# Patient Record
Sex: Male | Born: 1935 | Race: White | Hispanic: No | Marital: Married | Smoking: Former smoker
Health system: Southern US, Community
[De-identification: ages and names within clinical notes are randomized; demographics above are authoritative.]

## PROBLEM LIST (undated history)

## (undated) DIAGNOSIS — K259 Gastric ulcer, unspecified as acute or chronic, without hemorrhage or perforation: Secondary | ICD-10-CM

## (undated) DIAGNOSIS — F0390 Unspecified dementia without behavioral disturbance: Secondary | ICD-10-CM

## (undated) DIAGNOSIS — S065X9A Traumatic subdural hemorrhage with loss of consciousness of unspecified duration, initial encounter: Principal | ICD-10-CM

## (undated) DIAGNOSIS — I4891 Unspecified atrial fibrillation: Secondary | ICD-10-CM

## (undated) DIAGNOSIS — R05 Cough: Secondary | ICD-10-CM

## (undated) DIAGNOSIS — R269 Unspecified abnormalities of gait and mobility: Secondary | ICD-10-CM

## (undated) DIAGNOSIS — Z950 Presence of cardiac pacemaker: Secondary | ICD-10-CM

## (undated) DIAGNOSIS — D62 Acute posthemorrhagic anemia: Secondary | ICD-10-CM

## (undated) DIAGNOSIS — R634 Abnormal weight loss: Principal | ICD-10-CM

## (undated) DIAGNOSIS — E785 Hyperlipidemia, unspecified: Secondary | ICD-10-CM

## (undated) DIAGNOSIS — R609 Edema, unspecified: Secondary | ICD-10-CM

## (undated) DIAGNOSIS — E1129 Type 2 diabetes mellitus with other diabetic kidney complication: Secondary | ICD-10-CM

## (undated) DIAGNOSIS — S0081XA Abrasion of other part of head, initial encounter: Secondary | ICD-10-CM

## (undated) DIAGNOSIS — L738 Other specified follicular disorders: Secondary | ICD-10-CM

## (undated) DIAGNOSIS — R55 Syncope and collapse: Secondary | ICD-10-CM

## (undated) DIAGNOSIS — F32A Depression, unspecified: Secondary | ICD-10-CM

## (undated) DIAGNOSIS — R58 Hemorrhage, not elsewhere classified: Secondary | ICD-10-CM

## (undated) DIAGNOSIS — D649 Anemia, unspecified: Secondary | ICD-10-CM

## (undated) DIAGNOSIS — F329 Major depressive disorder, single episode, unspecified: Secondary | ICD-10-CM

## (undated) DIAGNOSIS — H9190 Unspecified hearing loss, unspecified ear: Secondary | ICD-10-CM

## (undated) DIAGNOSIS — R5383 Other fatigue: Secondary | ICD-10-CM

## (undated) DIAGNOSIS — I739 Peripheral vascular disease, unspecified: Secondary | ICD-10-CM

## (undated) DIAGNOSIS — K250 Acute gastric ulcer with hemorrhage: Secondary | ICD-10-CM

## (undated) DIAGNOSIS — N184 Chronic kidney disease, stage 4 (severe): Secondary | ICD-10-CM

## (undated) DIAGNOSIS — R32 Unspecified urinary incontinence: Secondary | ICD-10-CM

## (undated) DIAGNOSIS — W19XXXA Unspecified fall, initial encounter: Secondary | ICD-10-CM

## (undated) DIAGNOSIS — I482 Chronic atrial fibrillation, unspecified: Secondary | ICD-10-CM

## (undated) DIAGNOSIS — I495 Sick sinus syndrome: Secondary | ICD-10-CM

## (undated) DIAGNOSIS — R5381 Other malaise: Secondary | ICD-10-CM

## (undated) DIAGNOSIS — K922 Gastrointestinal hemorrhage, unspecified: Secondary | ICD-10-CM

## (undated) DIAGNOSIS — I10 Essential (primary) hypertension: Secondary | ICD-10-CM

## (undated) DIAGNOSIS — IMO0001 Reserved for inherently not codable concepts without codable children: Secondary | ICD-10-CM

## (undated) DIAGNOSIS — M6281 Muscle weakness (generalized): Secondary | ICD-10-CM

## (undated) DIAGNOSIS — L299 Pruritus, unspecified: Secondary | ICD-10-CM

## (undated) DIAGNOSIS — E559 Vitamin D deficiency, unspecified: Secondary | ICD-10-CM

## (undated) DIAGNOSIS — Z9181 History of falling: Secondary | ICD-10-CM

## (undated) DIAGNOSIS — N182 Chronic kidney disease, stage 2 (mild): Secondary | ICD-10-CM

## (undated) DIAGNOSIS — R059 Cough, unspecified: Secondary | ICD-10-CM

## (undated) HISTORY — DX: Traumatic subdural hemorrhage with loss of consciousness of unspecified duration, initial encounter: S06.5X9A

## (undated) HISTORY — DX: Hemorrhage, not elsewhere classified: R58

## (undated) HISTORY — DX: Vitamin D deficiency, unspecified: E55.9

## (undated) HISTORY — DX: Other specified follicular disorders: L73.8

## (undated) HISTORY — DX: Chronic atrial fibrillation, unspecified: I48.20

## (undated) HISTORY — DX: Muscle weakness (generalized): M62.81

## (undated) HISTORY — DX: Other fatigue: R53.83

## (undated) HISTORY — DX: Cough: R05

## (undated) HISTORY — DX: Abrasion of other part of head, initial encounter: S00.81XA

## (undated) HISTORY — DX: Other malaise: R53.81

## (undated) HISTORY — DX: Cough, unspecified: R05.9

## (undated) HISTORY — DX: Chronic kidney disease, stage 2 (mild): N18.2

## (undated) HISTORY — DX: Anemia, unspecified: D64.9

## (undated) HISTORY — DX: Unspecified hearing loss, unspecified ear: H91.90

## (undated) HISTORY — DX: Unspecified fall, initial encounter: W19.XXXA

## (undated) HISTORY — DX: Peripheral vascular disease, unspecified: I73.9

## (undated) HISTORY — PX: NO PAST SURGERIES: SHX2092

## (undated) HISTORY — DX: Type 2 diabetes mellitus with other diabetic kidney complication: E11.29

## (undated) HISTORY — DX: History of falling: Z91.81

## (undated) HISTORY — DX: Pruritus, unspecified: L29.9

## (undated) HISTORY — DX: Gastrointestinal hemorrhage, unspecified: K92.2

## (undated) HISTORY — DX: Syncope and collapse: R55

## (undated) HISTORY — DX: Reserved for inherently not codable concepts without codable children: IMO0001

## (undated) HISTORY — DX: Sick sinus syndrome: I49.5

## (undated) HISTORY — DX: Abnormal weight loss: R63.4

## (undated) HISTORY — DX: Acute posthemorrhagic anemia: D62

## (undated) HISTORY — DX: Hyperlipidemia, unspecified: E78.5

## (undated) HISTORY — DX: Acute gastric ulcer with hemorrhage: K25.0

## (undated) HISTORY — DX: Essential (primary) hypertension: I10

## (undated) HISTORY — DX: Presence of cardiac pacemaker: Z95.0

## (undated) HISTORY — DX: Unspecified abnormalities of gait and mobility: R26.9

## (undated) HISTORY — DX: Chronic kidney disease, stage 4 (severe): N18.4

## (undated) HISTORY — DX: Edema, unspecified: R60.9

## (undated) HISTORY — DX: Unspecified atrial fibrillation: I48.91

## (undated) HISTORY — DX: Unspecified urinary incontinence: R32

---
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
## 1997-12-05 ENCOUNTER — Ambulatory Visit (HOSPITAL_COMMUNITY): Admission: RE | Admit: 1997-12-05 | Discharge: 1997-12-05 | Payer: Self-pay | Admitting: *Deleted

## 1999-02-07 ENCOUNTER — Ambulatory Visit (HOSPITAL_COMMUNITY): Admission: RE | Admit: 1999-02-07 | Discharge: 1999-02-07 | Payer: Self-pay | Admitting: *Deleted

## 2001-01-06 HISTORY — PX: PACEMAKER INSERTION: SHX728

## 2001-04-04 ENCOUNTER — Encounter: Payer: Self-pay | Admitting: Emergency Medicine

## 2001-04-04 ENCOUNTER — Encounter: Payer: Self-pay | Admitting: Internal Medicine

## 2001-04-04 ENCOUNTER — Inpatient Hospital Stay (HOSPITAL_COMMUNITY): Admission: EM | Admit: 2001-04-04 | Discharge: 2001-04-08 | Payer: Self-pay | Admitting: Emergency Medicine

## 2001-04-08 ENCOUNTER — Encounter: Payer: Self-pay | Admitting: Cardiology

## 2003-02-21 ENCOUNTER — Emergency Department (HOSPITAL_COMMUNITY): Admission: EM | Admit: 2003-02-21 | Discharge: 2003-02-21 | Payer: Self-pay | Admitting: Emergency Medicine

## 2004-04-17 ENCOUNTER — Ambulatory Visit (HOSPITAL_COMMUNITY): Admission: RE | Admit: 2004-04-17 | Discharge: 2004-04-17 | Payer: Self-pay | Admitting: *Deleted

## 2005-09-18 ENCOUNTER — Encounter: Admission: RE | Admit: 2005-09-18 | Discharge: 2005-09-18 | Payer: Self-pay | Admitting: Internal Medicine

## 2007-05-11 ENCOUNTER — Encounter: Payer: Self-pay | Admitting: Internal Medicine

## 2007-06-16 ENCOUNTER — Inpatient Hospital Stay (HOSPITAL_COMMUNITY): Admission: EM | Admit: 2007-06-16 | Discharge: 2007-06-17 | Payer: Self-pay | Admitting: Emergency Medicine

## 2007-06-17 ENCOUNTER — Encounter (INDEPENDENT_AMBULATORY_CARE_PROVIDER_SITE_OTHER): Payer: Self-pay | Admitting: Internal Medicine

## 2007-06-17 ENCOUNTER — Ambulatory Visit: Payer: Self-pay | Admitting: Surgery

## 2007-08-05 ENCOUNTER — Ambulatory Visit (HOSPITAL_COMMUNITY): Admission: RE | Admit: 2007-08-05 | Discharge: 2007-08-05 | Payer: Self-pay | Admitting: Psychiatry

## 2007-08-05 HISTORY — PX: PACEMAKER GENERATOR CHANGE: SHX5998

## 2007-11-23 ENCOUNTER — Encounter: Payer: Self-pay | Admitting: Internal Medicine

## 2008-04-25 ENCOUNTER — Encounter: Payer: Self-pay | Admitting: Internal Medicine

## 2008-08-08 DIAGNOSIS — R55 Syncope and collapse: Secondary | ICD-10-CM

## 2008-08-08 HISTORY — DX: Syncope and collapse: R55

## 2008-11-07 ENCOUNTER — Encounter: Payer: Self-pay | Admitting: Internal Medicine

## 2008-12-25 DIAGNOSIS — N189 Chronic kidney disease, unspecified: Secondary | ICD-10-CM

## 2008-12-25 DIAGNOSIS — E785 Hyperlipidemia, unspecified: Secondary | ICD-10-CM

## 2008-12-25 DIAGNOSIS — E119 Type 2 diabetes mellitus without complications: Secondary | ICD-10-CM

## 2008-12-25 DIAGNOSIS — I495 Sick sinus syndrome: Secondary | ICD-10-CM | POA: Insufficient documentation

## 2008-12-25 DIAGNOSIS — I10 Essential (primary) hypertension: Secondary | ICD-10-CM | POA: Insufficient documentation

## 2008-12-26 ENCOUNTER — Ambulatory Visit: Payer: Self-pay | Admitting: Internal Medicine

## 2009-06-08 ENCOUNTER — Ambulatory Visit: Payer: Self-pay | Admitting: Internal Medicine

## 2009-06-13 ENCOUNTER — Telehealth: Payer: Self-pay | Admitting: Internal Medicine

## 2009-07-05 ENCOUNTER — Telehealth (INDEPENDENT_AMBULATORY_CARE_PROVIDER_SITE_OTHER): Payer: Self-pay | Admitting: *Deleted

## 2009-07-12 ENCOUNTER — Ambulatory Visit: Payer: Self-pay | Admitting: Internal Medicine

## 2009-08-28 ENCOUNTER — Encounter: Payer: Self-pay | Admitting: Internal Medicine

## 2009-09-21 ENCOUNTER — Inpatient Hospital Stay (HOSPITAL_COMMUNITY): Admission: EM | Admit: 2009-09-21 | Discharge: 2009-09-28 | Payer: Self-pay | Admitting: Emergency Medicine

## 2009-09-22 ENCOUNTER — Encounter: Payer: Self-pay | Admitting: Internal Medicine

## 2009-09-24 ENCOUNTER — Encounter (INDEPENDENT_AMBULATORY_CARE_PROVIDER_SITE_OTHER): Payer: Self-pay | Admitting: Internal Medicine

## 2009-10-11 ENCOUNTER — Ambulatory Visit: Payer: Self-pay | Admitting: Internal Medicine

## 2009-12-18 ENCOUNTER — Ambulatory Visit: Payer: Self-pay | Admitting: Internal Medicine

## 2009-12-24 ENCOUNTER — Encounter: Payer: Self-pay | Admitting: Internal Medicine

## 2009-12-25 ENCOUNTER — Encounter: Payer: Self-pay | Admitting: Internal Medicine

## 2010-01-01 DIAGNOSIS — R32 Unspecified urinary incontinence: Secondary | ICD-10-CM

## 2010-01-01 HISTORY — DX: Unspecified urinary incontinence: R32

## 2010-01-27 ENCOUNTER — Encounter: Payer: Self-pay | Admitting: Internal Medicine

## 2010-02-05 NOTE — Progress Notes (Signed)
  Records Recieved from [facility], gave to [employer]  July 05, 2009 2:38 PM

## 2010-02-05 NOTE — Progress Notes (Signed)
Summary: [facility]   Imported By: Roderic Ovens 08/16/2009 09:41:44  _____________________________________________________________________  External Attachment:    Type:   Image     Comment:   External Document

## 2010-02-05 NOTE — Letter (Signed)
Summary: [facility]   Imported By: Marylou Mccoy 09/27/2009 15:56:45  _____________________________________________________________________  External Attachment:    Type:   Image     Comment:   External Document

## 2010-02-05 NOTE — Progress Notes (Signed)
Summary: [facility]   Imported By: Roderic Ovens 08/16/2009 09:40:32  _____________________________________________________________________  External Attachment:    Type:   Image     Comment:   External Document

## 2010-02-05 NOTE — Consult Note (Signed)
Summary: [facility]   Imported By: Roderic Ovens 10/23/2009 14:51:52  _____________________________________________________________________  External Attachment:    Type:   Image     Comment:   External Document

## 2010-02-05 NOTE — Cardiovascular Report (Signed)
Summary: TTM   TTM   Imported By: Roderic Ovens 10/16/2009 13:04:53  _____________________________________________________________________  External Attachment:    Type:   Image     Comment:   External Document

## 2010-02-05 NOTE — Cardiovascular Report (Signed)
Summary: Office Visit   Office Visit   Imported By: Roderic Ovens 06/22/2009 14:53:32  _____________________________________________________________________  External Attachment:    Type:   Image     Comment:   External Document

## 2010-02-05 NOTE — Cardiovascular Report (Signed)
Summary: TTM   TTM   Imported By: Roderic Ovens 07/26/2009 16:10:26  _____________________________________________________________________  External Attachment:    Type:   Image     Comment:   External Document

## 2010-02-05 NOTE — Progress Notes (Signed)
Summary: pt upset wants to talk to nurse about letter he got-call next wk  Phone Note Call from Patient Call back at Home Phone [phone]   Caller: Patient Reason for Call: Talk to Nurse, Talk to Doctor Summary of Call: pt is very upset because he dosen't feel like at his last visit he was checked and he said it is to much for him to drive all the way over here for nothing and he will not make another appt until someone calls him and tells him what the hell is going on because he thinks he is mixed up with a bunch of idiots Initial call taken by: Omer Jack,  June 13, 2009 1:08 PM  Follow-up for Phone Call        explained to pt that he did see Dr Ladona Ridgel and tried to help him remember.  He is going to try a find the paper he says was mailed to him that says follow up in a year.  I explained to him he needed to be seen in 6 months.  I will call him next week to see if he found paper. Dennis Bast, RN, BSN  June 13, 2009 5:24 PM    Additional Follow-up for Phone Call Additional follow up Details #2::    When he comes back in 6 months will point out Dr Ladona Ridgel to him to help with his confusion.Dennis Bast, RN, BSN  June 19, 2009 9:16 AM

## 2010-02-05 NOTE — Assessment & Plan Note (Signed)
Summary: device/saf   Visit Type:  Follow-up Primary Provider:  Chilton Si, MD   History of Present Illness: Patrick Murray is referred today for ongoing PPM followup by Dr. Aleen Campi.  The patient is a pleasant elderly man who still lives independently at [facility].  He has a h/o bradycardia and underwent PPM several yrs ago with a pulse generator change by Dr. Lynnea Ferrier several months ago.  He has done well.  He denies c/p, sob, or peripheral edema.  He has had no syncope.  He has had minimal dizziness.  Current Medications (verified): 1)  Amlodipine Besylate 5 Mg Tabs (Amlodipine Besylate) .... Take One-Half Tablet By Mouth Daily 2)  Calcitriol 0.25 Mcg Caps (Calcitriol) .... 2 Tabs Even Days 1 Tab Odd Days 3)  Losartan Potassium 100 Mg Tabs (Losartan Potassium) .... One-Half Tab Once Daily 4)  Hydrochlorothiazide 25 Mg Tabs (Hydrochlorothiazide) .... Take One-Half Tablet By Mouth Daily. 5)  Glipizide 5 Mg Tabs (Glipizide) .... Take One Tablet By Mouth Once Daily. 6)  Sertraline Hcl 50 Mg Tabs (Sertraline Hcl) .... Take One Tablet By Mouth Once Daily. 7)  Galantamine Hydrobromide .Marland Kitchen.. 2 Tabs Once Daily 8)  Glucosamine Sulfate   Powd (Glucosamine Sulfate) .... 1500mg  Once Daily 9)  Aspirin 81 Mg Tbec (Aspirin) .... Take One Tablet By Mouth Daily  Allergies (verified): 1)  ! Simvastatin  Past History:  Past Medical History: Last updated: 12/25/2008 Current Problems:  CARDIAC PACEMAKER ST.JUDE XLDR 5826 (ICD-V45.01) PAROXYSMAL ATRIAL FIBRILLATION (ICD-427.31) RENAL INSUFFICIENCY, CHRONIC (ICD-585.9) HYPERTENSION (ICD-401.9) HYPERLIPIDEMIA (ICD-272.4) DM (ICD-250.00) SICK SINUS SYNDROME (ICD-427.81)    Vital Signs:  Patient profile:   77 year old male Height:      71 inches Weight:      177 pounds BMI:     24.78 Pulse rate:   71 / minute BP sitting:   158 / 64  (left arm)  Vitals Entered By: Laurance Flatten CMA (June 08, 2009 3:23 PM)  Physical Exam  General:  Elderly, well  developed, well nourished, in no acute distress.  HEENT: normal Neck: supple. No JVD. Carotids 2+ bilaterally no bruits Cor: RRR no rubs, gallops or murmur Lungs: CTA.  Well healed PPM incision. Ab: soft, nontender. nondistended. No HSM. Good bowel sounds Ext: warm. no cyanosis, clubbing or edema Neuro: alert and oriented. Grossly nonfocal. affect pleasant    PPM Specifications Following MD:  Lewayne Bunting, MD     PPM Vendor:  St Jude     PPM Model Number:  [id number]     PPM Serial Number:  [id number] PPM DOI:  07/05/2008     PPM Implanting MD:  NOT IMPLANTED BY [place]  Lead 1    Location: RA     DOI: 04/07/2001     Model #: 1342T     Serial #: [id number]     Status: active Lead 2    Location: RV     DOI: 04/07/2001     Model #: 1336T     Serial #: [id number]     Status: active  Magnet Response Rate:  BOL 98.6 ERI  86.3    PPM Follow Up Battery Voltage:  2.81 V     Battery Est. Longevity:  8-10 YRS     Pacer Dependent:  No       PPM Device Measurements Atrium  Amplitude: 3.8 mV, Impedance: 396 ohms, Threshold: 0.50 V at 0.5 msec Right Ventricle  Amplitude: 5.7 mV, Impedance: 682 ohms, Threshold: 0.75 V at 0.5 msec  Episodes MS Episodes:  48     Percent Mode Switch:  25%     Ventricular High Rate:  0     Atrial Pacing:  62%     Ventricular Pacing:  69%  Parameters Mode:  DDD     Lower Rate Limit:  60     Upper Rate Limit:  110 Paced AV Delay:  250     Sensed AV Delay:  200 Next Cardiology Appt Due:  12/06/2009 Tech Comments:  25% OF TIME IN MODE SWITCH.  NORMAL DEVICE FUNCTION.  CHANGED RV AMPLITUDE FROM 2.00 TO 2.50 V.  ROV IN 6 MTHS.  CHECKING ON PHONE CHECKS THRU [employer]. Vella Kohler  June 08, 2009 3:45 PM  Impression & Recommendations:  Problem # 1:  CARDIAC PACEMAKER ST.JUDE XLDR 5826 (ICD-V45.01) His device is working normally.  Will start doing phone checks.    Problem # 2:  PAROXYSMAL ATRIAL FIBRILLATION (ICD-427.31) He is maintaining NSR.  Continue current meds. His updated  medication list for this problem includes:    Aspirin 81 Mg Tbec (Aspirin) .Marland Kitchen... Take one tablet by mouth daily  Problem # 3:  HYPERTENSION (ICD-401.9) A low sodium diet is requested.  He will continue meds as  below. His updated medication list for this problem includes:    Amlodipine Besylate 5 Mg Tabs (Amlodipine besylate) .Marland Kitchen... Take one-half tablet by mouth daily    Losartan Potassium 100 Mg Tabs (Losartan potassium) ..... One-half tab once daily    Hydrochlorothiazide 25 Mg Tabs (Hydrochlorothiazide) .Marland Kitchen... Take one-half tablet by mouth daily.    Aspirin 81 Mg Tbec (Aspirin) .Marland Kitchen... Take one tablet by mouth daily  Patient Instructions: 1)  Your physician recommends that you schedule a follow-up appointment in: 6 months with Dr Ladona Ridgel

## 2010-02-05 NOTE — Cardiovascular Report (Signed)
Summary: TTM   TTM   Imported By: Roderic Ovens 10/23/2009 11:07:58  _____________________________________________________________________  External Attachment:    Type:   Image     Comment:   External Document

## 2010-02-07 NOTE — Cardiovascular Report (Signed)
Summary: Office Visit   Office Visit   Imported By: Roderic Ovens 01/04/2010 16:23:01  _____________________________________________________________________  External Attachment:    Type:   Image     Comment:   External Document

## 2010-02-07 NOTE — Procedures (Signed)
Summary: device/saf   Current Medications (verified): 1)  Amlodipine Besylate 5 Mg Tabs (Amlodipine Besylate) .... Take One-Half Tablet By Mouth Daily 2)  Calcitriol 0.25 Mcg Caps (Calcitriol) .... 2 Tabs Even Days 1 Tab Odd Days 3)  Losartan Potassium 100 Mg Tabs (Losartan Potassium) .... One-Half Tab Once Daily 4)  Hydrochlorothiazide 25 Mg Tabs (Hydrochlorothiazide) .... Take One-Half Tablet By Mouth Daily. 5)  Glipizide 5 Mg Tabs (Glipizide) .... Take One Tablet By Mouth Once Daily. 6)  Sertraline Hcl 50 Mg Tabs (Sertraline Hcl) .... Take One Tablet By Mouth Once Daily. 7)  Galantamine Hydrobromide .Marland Kitchen.. 2 Tabs Once Daily 8)  Glucosamine Sulfate   Powd (Glucosamine Sulfate) .... 1500mg  Once Daily 9)  Aspirin 81 Mg Tbec (Aspirin) .... Take One Tablet By Mouth Daily  Allergies (verified): 1)  ! Simvastatin  PPM Specifications Following MD:  Lewayne Bunting, MD     PPM Vendor:  St Jude     PPM Model Number:  [id number]     PPM Serial Number:  [id number] PPM DOI:  07/05/2008     PPM Implanting MD:  NOT IMPLANTED BY [place]  Lead 1    Location: RA     DOI: 04/07/2001     Model #: 1342T     Serial #: [id number]     Status: active Lead 2    Location: RV     DOI: 04/07/2001     Model #: 1336T     Serial #: [id number]     Status: active  Magnet Response Rate:  BOL 98.6 ERI  86.3    PPM Follow Up Battery Voltage:  2.79 V     Battery Est. Longevity:  7.75-10 yrs     Pacer Dependent:  No       PPM Device Measurements Atrium  Amplitude: 1.6 mV, Impedance: 350 ohms,  Right Ventricle  Amplitude: 6.9 mV, Impedance: 521 ohms, Threshold: 0.75 V at 0.5 msec  Episodes MS Episodes:  16     Percent Mode Switch:  76%     Ventricular High Rate:  0     Atrial Pacing:  66%     Ventricular Pacing:  77%  Parameters Mode:  DDD     Lower Rate Limit:  60     Upper Rate Limit:  110 Paced AV Delay:  250     Sensed AV Delay:  200 Next Cardiology Appt Due:  06/07/2010 Tech Comments:  PT IN AF 76% OF TIME. - COUMADIN.  NORMAL  DEVICE FUNCTION.  NO CHANGES MADE. ROV IN 6 MTHS W/GT. Vella Kohler  December 24, 2009 11:43 AM

## 2010-02-07 NOTE — Letter (Signed)
Summary: [facility] Progress Note   [facility] Progress Note   Imported By: Roderic Ovens 01/09/2010 16:04:09  _____________________________________________________________________  External Attachment:    Type:   Image     Comment:   External Document

## 2010-02-28 ENCOUNTER — Encounter: Payer: Self-pay | Admitting: Internal Medicine

## 2010-02-28 DIAGNOSIS — I495 Sick sinus syndrome: Secondary | ICD-10-CM

## 2010-03-19 NOTE — Cardiovascular Report (Signed)
Summary: TTM   TTM   Imported By: Roderic Ovens 03/12/2010 15:58:23  _____________________________________________________________________  External Attachment:    Type:   Image     Comment:   External Document

## 2010-03-21 LAB — CBC
HCT: 20.3 % — ABNORMAL LOW (ref 39.0–52.0)
HCT: 26 % — ABNORMAL LOW (ref 39.0–52.0)
HCT: 26.2 % — ABNORMAL LOW (ref 39.0–52.0)
HCT: 26.6 % — ABNORMAL LOW (ref 39.0–52.0)
HCT: 26.9 % — ABNORMAL LOW (ref 39.0–52.0)
Hemoglobin: 8.9 g/dL — ABNORMAL LOW (ref 13.0–17.0)
Hemoglobin: 9 g/dL — ABNORMAL LOW (ref 13.0–17.0)
Hemoglobin: 9.2 g/dL — ABNORMAL LOW (ref 13.0–17.0)
Hemoglobin: 9.4 g/dL — ABNORMAL LOW (ref 13.0–17.0)
MCH: 31 pg (ref 26.0–34.0)
MCH: 31.7 pg (ref 26.0–34.0)
MCH: 31.9 pg (ref 26.0–34.0)
MCH: 31.9 pg (ref 26.0–34.0)
MCHC: 34.5 g/dL (ref 30.0–36.0)
MCHC: 34.5 g/dL (ref 30.0–36.0)
MCHC: 34.7 g/dL (ref 30.0–36.0)
MCHC: 34.8 g/dL (ref 30.0–36.0)
MCV: 89.8 fL (ref 78.0–100.0)
MCV: 91.1 fL (ref 78.0–100.0)
MCV: 91.2 fL (ref 78.0–100.0)
MCV: 91.2 fL (ref 78.0–100.0)
Platelets: 123 10*3/uL — ABNORMAL LOW (ref 150–400)
Platelets: 130 10*3/uL — ABNORMAL LOW (ref 150–400)
Platelets: 131 10*3/uL — ABNORMAL LOW (ref 150–400)
Platelets: 132 10*3/uL — ABNORMAL LOW (ref 150–400)
RBC: 2.82 MIL/uL — ABNORMAL LOW (ref 4.22–5.81)
RBC: 3 MIL/uL — ABNORMAL LOW (ref 4.22–5.81)
RDW: 14 % (ref 11.5–15.5)
RDW: 14.3 % (ref 11.5–15.5)
RDW: 14.4 % (ref 11.5–15.5)
RDW: 14.7 % (ref 11.5–15.5)
RDW: 14.8 % (ref 11.5–15.5)
WBC: 11.4 10*3/uL — ABNORMAL HIGH (ref 4.0–10.5)
WBC: 7.6 10*3/uL (ref 4.0–10.5)
WBC: 9 10*3/uL (ref 4.0–10.5)

## 2010-03-21 LAB — RENAL FUNCTION PANEL
BUN: 68 mg/dL — ABNORMAL HIGH (ref 6–23)
CO2: 20 mEq/L (ref 19–32)
Calcium: 8.1 mg/dL — ABNORMAL LOW (ref 8.4–10.5)
Glucose, Bld: 94 mg/dL (ref 70–99)
Sodium: 139 mEq/L (ref 135–145)

## 2010-03-21 LAB — RETICULOCYTES
RBC.: 2.63 MIL/uL — ABNORMAL LOW (ref 4.22–5.81)
Retic Count, Absolute: 65.8 10*3/uL (ref 19.0–186.0)
Retic Ct Pct: 2.5 % (ref 0.4–3.1)

## 2010-03-21 LAB — GLUCOSE, CAPILLARY
Glucose-Capillary: 115 mg/dL — ABNORMAL HIGH (ref 70–99)
Glucose-Capillary: 131 mg/dL — ABNORMAL HIGH (ref 70–99)
Glucose-Capillary: 135 mg/dL — ABNORMAL HIGH (ref 70–99)
Glucose-Capillary: 143 mg/dL — ABNORMAL HIGH (ref 70–99)
Glucose-Capillary: 146 mg/dL — ABNORMAL HIGH (ref 70–99)
Glucose-Capillary: 147 mg/dL — ABNORMAL HIGH (ref 70–99)
Glucose-Capillary: 187 mg/dL — ABNORMAL HIGH (ref 70–99)
Glucose-Capillary: 89 mg/dL (ref 70–99)
Glucose-Capillary: 94 mg/dL (ref 70–99)

## 2010-03-21 LAB — COMPREHENSIVE METABOLIC PANEL
ALT: 12 U/L (ref 0–53)
AST: 18 U/L (ref 0–37)
Albumin: 3.6 g/dL (ref 3.5–5.2)
Calcium: 9.5 mg/dL (ref 8.4–10.5)
GFR calc Af Amer: 32 mL/min — ABNORMAL LOW (ref >60)
Potassium: 4.8 mEq/L (ref 3.5–5.1)
Sodium: 140 mEq/L (ref 135–145)
Total Protein: 6.6 g/dL (ref 6.0–8.3)

## 2010-03-21 LAB — DIFFERENTIAL
Basophils Absolute: 0 10*3/uL (ref 0.0–0.1)
Basophils Relative: 0 % (ref 0–1)
Eosinophils Absolute: 0 10*3/uL (ref 0.0–0.7)
Eosinophils Relative: 0 % (ref 0–5)
Lymphs Abs: 0.5 10*3/uL — ABNORMAL LOW (ref 0.7–4.0)
Monocytes Absolute: 0.4 10*3/uL (ref 0.1–1.0)
Monocytes Relative: 4 % (ref 3–12)
Neutro Abs: 7 10*3/uL (ref 1.7–7.7)
Neutrophils Relative %: 78 % — ABNORMAL HIGH (ref 43–77)

## 2010-03-21 LAB — CULTURE, BLOOD (ROUTINE X 2)

## 2010-03-21 LAB — URINE CULTURE
Culture  Setup Time: 201109170141
Culture: NO GROWTH

## 2010-03-21 LAB — FOLATE: Folate: 9.1 ng/mL

## 2010-03-21 LAB — BASIC METABOLIC PANEL
BUN: 26 mg/dL — ABNORMAL HIGH (ref 6–23)
BUN: 37 mg/dL — ABNORMAL HIGH (ref 6–23)
BUN: 53 mg/dL — ABNORMAL HIGH (ref 6–23)
BUN: 89 mg/dL — ABNORMAL HIGH (ref 6–23)
CO2: 19 mEq/L (ref 19–32)
CO2: 20 mEq/L (ref 19–32)
CO2: 20 mEq/L (ref 19–32)
Calcium: 8.5 mg/dL (ref 8.4–10.5)
Chloride: 112 mEq/L (ref 96–112)
Chloride: 114 mEq/L — ABNORMAL HIGH (ref 96–112)
Chloride: 115 mEq/L — ABNORMAL HIGH (ref 96–112)
Creatinine, Ser: 1.96 mg/dL — ABNORMAL HIGH (ref 0.4–1.5)
Creatinine, Ser: 2 mg/dL — ABNORMAL HIGH (ref 0.4–1.5)
Creatinine, Ser: 2.02 mg/dL — ABNORMAL HIGH (ref 0.4–1.5)
GFR calc Af Amer: 38 mL/min — ABNORMAL LOW (ref >60)
GFR calc non Af Amer: 32 mL/min — ABNORMAL LOW (ref >60)
Glucose, Bld: 125 mg/dL — ABNORMAL HIGH (ref 70–99)
Glucose, Bld: 129 mg/dL — ABNORMAL HIGH (ref 70–99)
Glucose, Bld: 139 mg/dL — ABNORMAL HIGH (ref 70–99)
Potassium: 4 mEq/L (ref 3.5–5.1)
Potassium: 4.1 mEq/L (ref 3.5–5.1)
Sodium: 141 mEq/L (ref 135–145)

## 2010-03-21 LAB — CARDIAC PANEL(CRET KIN+CKTOT+MB+TROPI)
CK, MB: 5.9 ng/mL — ABNORMAL HIGH (ref 0.3–4.0)
CK, MB: 6.8 ng/mL (ref 0.3–4.0)
Relative Index: 2.5 (ref 0.0–2.5)
Relative Index: 2.8 — ABNORMAL HIGH (ref 0.0–2.5)
Relative Index: 3.8 — ABNORMAL HIGH (ref 0.0–2.5)
Total CK: 174 U/L (ref 7–232)
Troponin I: 0.02 ng/mL (ref 0.00–0.06)
Troponin I: 0.03 ng/mL (ref 0.00–0.06)

## 2010-03-21 LAB — URINALYSIS, ROUTINE W REFLEX MICROSCOPIC
Bilirubin Urine: NEGATIVE
Hgb urine dipstick: NEGATIVE
Ketones, ur: NEGATIVE mg/dL
Specific Gravity, Urine: 1.018 (ref 1.005–1.030)
Urobilinogen, UA: 0.2 mg/dL (ref 0.0–1.0)

## 2010-03-21 LAB — CROSSMATCH

## 2010-03-21 LAB — FERRITIN: Ferritin: 46 ng/mL (ref 22–322)

## 2010-03-21 LAB — CORTISOL: Cortisol, Plasma: 14.8 ug/dL

## 2010-03-21 LAB — POCT CARDIAC MARKERS
CKMB, poc: 4.5 ng/mL (ref 1.0–8.0)
Myoglobin, poc: 394 ng/mL (ref 12–200)
Troponin i, poc: <0.05 ng/mL (ref 0.00–0.09)

## 2010-03-21 LAB — CREATININE, URINE, RANDOM: Creatinine, Urine: 74.3 mg/dL

## 2010-03-21 LAB — IRON AND TIBC
Iron: 124 ug/dL (ref 42–135)
UIBC: 211 ug/dL

## 2010-03-21 LAB — PROTIME-INR: INR: 1.15 (ref 0.00–1.49)

## 2010-05-21 NOTE — H&P (Signed)
NAME:  Patrick Murray, Patrick Murray NO.:  [id number]   MEDICAL RECORD NO.:  [id number]          PATIENT TYPE:  OBV   LOCATION:  1827                         FACILITY:  [facility]   PHYSICIAN:  Mobolaji B. Bakare, M.D.DATE OF BIRTH:  Jul 15, 1917   DATE OF ADMISSION:  06/16/2007  DATE OF DISCHARGE:                              HISTORY & PHYSICAL   PRIMARY CARE PHYSICIAN:  Dr. Merri Brunette.   CARDIOLOGIST:  Dr. Aleen Campi.   CHIEF COMPLAINT:  Almost passing out.   HISTORY OF PRESENTING COMPLAINT:  Patrick Murray is a pleasant 77-year-  old Caucasian male who looks younger for his age.  He is pretty much  active at home.  He goes to the gym 3 times a week.  Today when he woke  up, he was not feeling well.  He tells me that he decided to go to the  gym anyway.  Upon arrival at the gym while changing, he felt the urge to  go to the bathroom.  On entering the bathroom, he did not feel his usual  self.  He leaned against the wall and slid down and sat on the floor.  There was no associated chest pain, diaphoresis.  No shortness of  breath.  He denies palpitations.  He did not pass out.  Bystander  decided to call EMS.  The patient was brought to the emergency room for  further evaluation.  He has been seen by Dr. Aleen Campi, and his pacemaker  has been interrogated.  This appears to be okay.  There is no change in  pacer status.  The patient is being admitted for 24-hour observation.  He had an orthostatic blood pressure and heart rate done in the  emergency room.  He was not orthostatic.   The patient tells me he has been having on and off loose stools for  about one to two weeks.  There is no abdominal pain, fever, chills.  He  denies dysuria, urgency or increased frequency of micturition.  He does  not strain at micturition.  He denies cough, shortness of breath,  headaches.   REVIEW OF SYSTEMS:  As in the HPI.   PAST MEDICAL HISTORY:  1. Sick sinus syndrome status post pacemaker.  2. Diabetes mellitus.  3. Chronic kidney disease.  Baseline BUN and creatinine in January      2009 was 34/1.9.  4. History of paroxysmal atrial fibrillation.  5. Hyperlipidemia.  6. Hypertension.  7. Chronic renal insufficiency.   PAST SURGICAL HISTORY:  1. Pacemaker insertion.  2. Appendectomy.  3. Cholecystectomy.   CURRENT MEDICATIONS:  The patient is unable to remember list of current  medications.  He obtains his medications from the [place] system.  The  patient tells me that he has taken almost all of his medications for  today.  He was previously on Coumadin, but this was temporarily held and  replaced with Plavix in view of possibility of changing the pacemaker  battery.  Hence, he is not currently on Coumadin.   ALLERGIES:  No known drug allergies.   FAMILY HISTORY:  Noncontributory.   SOCIAL HISTORY:  The patient  does not smoke cigarettes.  He occasionally  drinks wine.  He is a retired Cytogeneticist.  He worked at [employer].  The patient lives in [facility] in the independent  living.  His a wife is in a nursing facility.   CURRENT VITALS:  Temperature 98.3, blood pressure 165/71, heart rate 73,  respiratory rate 20, O2 saturations 98% on room air.  On examination, the patient is awake, alert, oriented to time, place and  person.  Normocephalic, atraumatic.  Pupils equal, round and reactive to light.  No nystagmus.  No elevated JVD.  No carotid bruit.  LUNGS:  Clear clinically to auscultation.  CARDIOVASCULAR SYSTEM:  S1-S2 regular.  No murmur.  ABDOMEN:  Nondistended, soft, nontender.  No palpable organomegaly.  EXTREMITIES:  No pedal edema or calf tenderness.  Dorsalis pedis pulses  palpable bilaterally.  He has a left ankle swelling.  CENTRAL NERVOUS SYSTEM:  No focal neurological deficit.   INITIAL LABORATORY DATA:  Troponin 0.01, CK-MB 8.3, relative index 3.9.  Sodium 140, potassium 4.2, chloride 111, glucose 197, BUN 36, creatinine  2.2.   White cell 11, hemoglobin 12, hematocrit 34.6, platelets 120,  neutrophils 87%.   EKG shows no acute ST abnormality.  Chest x-ray shows no active lung  disease, linear scarring or atelectasis at the left lung base.   ASSESSMENT AND PLAN:  Patrick Murray is an 76 year old Caucasian male  presenting with presyncope associated with sudden onset of weakness.  He  is now feeling much better.  He has been having ongoing loose stool  intermittently for the past one to two weeks.  He has leukocytosis of  11,000.  No fever and no obvious sign of infection.  He will be admitted  for further evaluation and treatment.  His pacemaker has been  interrogated, and this seems to do working alright.   ADMISSION DIAGNOSES:  1. Presyncope.  Etiology is unclear.  Will admit to telemetry.  Cycle      cardiac enzymes.  He has no cardiac murmur, and I am not sure if he      had a recent 2-D echocardiogram with Dr. Aleen Campi.  Will defer      these to cardiology.  2. Leukocytosis, probably reactive.  Chest x-ray shows no acute      pulmonary disease.  Will check urinalysis.  3. Loose stools.  Will send stool for clostridium difficile, fecal      leukocytes, ova and parasites, stool culture.  I am not sure he is      on any laxative.  The patient does not think so.  4. Diabetes mellitus.  He cannot recall the name of his medications.      Will place on sliding scale insulin with NovoLog and obtain record      medication list from Dr. Renne Crigler in the morning.  5. Thrombocytopenia.  He has no active bleeding.  Baseline platelets      unknown.  Will monitor platelets during this hospitalization.  6. Mild anemia.  Will check stool Hemoccult and anemia panel.  7. Chronic kidney disease.  BUN and creatinine in January was 1.9.      This is fairly stable.  Will monitor.  8. Left ankle swelling.  Will check lower extremity Dopplers to rule      out DVT.      Mobolaji B. Corky Downs, M.D.  Electronically Signed      MBB/MEDQ  D:  06/16/2007  T:  06/16/2007  Job:  [id number]   cc:   Soyla Murphy. Renne Crigler, M.D.  Antionette Char, MD  Dyke Maes, M.D.

## 2010-05-21 NOTE — Discharge Summary (Signed)
NAME:  Patrick Murray, Patrick Murray NO.:  [id number]   MEDICAL RECORD NO.:  [id number]          PATIENT TYPE:  INP   LOCATION:  4729                         FACILITY:  [facility]   PHYSICIAN:  Mobolaji B. Bakare, M.D.DATE OF BIRTH:  [birth year]   DATE OF ADMISSION:  06/16/2007  DATE OF DISCHARGE:  06/17/2007                               DISCHARGE SUMMARY   PRIMARY CARE PHYSICIAN:  Dr. Renne Crigler.   CARDIOLOGIST:  Dr. Aleen Campi.   DISCHARGE DIAGNOSES:  1. Presyncope, probably secondary to dehydration.  2. Loose stools, resolved.  3. Acute on chronic kidney disease.  4. Left ankle swelling, probably secondary to trauma.  5. Mild thrombocytopenia, stable.  6. Mild anemia.   SECONDARY DIAGNOSES:  1. Diabetes mellitus.  2. Sick sinus syndrome, status post pacemaker.  3. History of paroxysmal atrial fibrillation, hyperlipidemia,      hypertension, and chronic renal insufficiency.   CONSULTANTS:  Cardiology consult provided by Dr. Aleen Campi.   PROCEDURES:  1. Left lower extremity Doppler was negative for DVT.  2. Chest x-ray showed no acute lung disease, linear scarring, or      atelectasis at the left lung base.   BRIEF HISTORY:  In brief, Patrick Murray is an 77 year old Caucasian  male who was admitted yesterday with history of almost passing out.  He  was not feeling well when he woke up yesterday morning.  He went to the  gym and while about to change in the gym he had an urge to go to the  bathroom.  On entering the bathroom he did not feel his usual self,  hence he had to lean against the wall.  He eventually slid down and sat  on the floor.  He did not pass out.  No diaphoresis, chest pain, or  shortness of breath.  EMS was called, and he was brought to the  emergency room for further evaluation.   Prior to this episode, the patient has been having on-and-off loose  stools for 2 weeks.  No associated nausea, vomiting, or abdominal pain.  He had no fever.  He normally walks  around friend's home, where he  resides.  In addition, he goes to the gym about 3 times a day.  While in  the emergency room he was not orthostatic; however, his BUN and  creatinine were slightly worse than what it was in chart in January  2009.   The patient was evaluated by Dr. Aleen Campi in the emergency room.  His  pacemaker was interrogated.  Dr. Aleen Campi opined that he might have been  pacing at the lower rate limit with standing.  Otherwise, the pacemaker  checkup was appropriate.  Prior to this hospitalization, there was  discussion regarding changing the pacemaker's battery.  The patient will  be following up with Dr. Aleen Campi in the outpatient setting in 1 week.   HOSPITAL COURSE:  Presyncope.  He had 3 sets of cardiac markers which  showed normal troponin but elevated CK and CK-MB.  The patient had no  chest pain.  EKG showed usual P axis and atrial bradycardia with first-  degree AV block, no acute  ST changes.  Orthostatic blood pressure done  in the emergency room was normal.  He was on telemetry, and patient was  noted to be in normal sinus rhythm.  CBG on admission was normal.  Physical examination did not reveal any cardiac murmur.  The patient was  feeling better at the time he arrived in the emergency room.  He  continued to improve, and there was no recurrence of this episode during  the hospitalization.  He was ambulated in the hallway and was steady on  his feet.  The patient was hydrated with IV fluid.  BUN and creatinine  improved to 30/1.71 prior to discharge.  It is quite possible that  dehydration could have contributed to this episode.  While on telemetry,  his heart rate ranged between 60 to 80, with few dips in the mid 50s.   Loose stool.  While in the hospital he did not have any episode of loose  stool, but prior to hospitalization this had been intermittent for the  past 2 weeks.  The patient has no other GI symptoms.  We were unable to  collect any  specimen during this hospitalization.  The patient could not  recall the names of all his medications, and I have instructed him to  check if he is using any stool softener or laxative, which he should  stop.   Left ankle swelling.  The patient was noted on physical examination to  have left ankle swelling which he did not recall ever noticing prior to  the presyncopal episode.  We did a lower-extremity Doppler which was  negative for DVT.  On the second day of admission there was clearly  evidence of bruise over the left ankle, suggesting trauma.  This could  possibly have happened at the time of presyncopal episode.  The patient  is not having significant pain from this, and he had an x-ray of right  and left ankle which did not show any obvious acute fracture  (preliminary reading by me) .  He can use Tylenol as needed for pain.   Acute on chronic kidney disease.  Baseline BUN and creatinine in January  2009 were 34/1.9.  Upon admission, BUN was 36 and creatinine of 2.2.  He  was rehydrated, and at the time of discharge BUN was 30 and creatinine  1.71.  He normally follows up with Dr. Briant Cedar.   Mild normocytic anemia and thrombocytopenia.  Hemoglobin in January 2009  was 11.9.  Hemoglobin at the time of admission was 12.  This was really  unchanged.  He has chronic kidney disease.  Anemia panel was within  normal, with iron of 59, TIBC 282, ferritin 94, folate greater than 20,  vitamin B12 of 483.  Likewise platelets in April 2009 was 130, and  platelets at the time of admission were 120; this has been relatively  stable.  I will defer further monitoring to his primary care physician.   DISCHARGE LABORATORY DATA:  TSH 0.389, sodium 140, potassium 3.8,  chloride 112, CO2 of 25, glucose 79, BUN 30, creatinine 1.71, calcium  8.9, white cells 9.4, hemoglobin 10.5, hematocrit 29.5, platelets 122.   DISCHARGE MEDICATIONS:  1. Plavix 75 mg daily.  2. Aspirin 1 daily.  3. The patient  will continue all other home medications as before.  He      could not provide names and dosages of all his medications.  He      will resume these medications today as  he did before      hospitalization.   DISCHARGE CONDITION:  Stable and ambulating without assistance.  He does  not need Home-Health PT and OT.   DISCHARGE INSTRUCTIONS:  1. Stop any stool softener or laxative.  2. Follow up with Dr. Aleen Campi in 1 week.  3. Follow up with Dr. Merri Brunette in 1 week.   ADDENDUM:  Final left ankle x-ray shows irregular cortical lucency present in the  lateral malleolar tip, which is suspicious for small avulsion fracture,  possibly acute and probable old avulsion fracture off the medial  malleolus.  Patient has been informed of these findings. He is ambulating without  difficulty nor pain in the left ankle.      Mobolaji B. Corky Downs, M.D.  Electronically Signed     MBB/MEDQ  D:  06/17/2007  T:  06/18/2007  Job:  [id number]   cc:   Soyla Murphy. Renne Crigler, M.D.  Antionette Char, MD  Dyke Maes, M.D.

## 2010-05-24 NOTE — Procedures (Signed)
[place]  Patient:    Patrick Murray, Patrick Murray Visit Number: [id number] MRN: [id number]          Service Type: MED Location: 3W 0379 01 Attending Physician:  Londell Moh Dictated by:   Aram Candela. Aleen Campi, M.D. Proc. Date: 04/07/01 Admit Date:  04/04/2001   CC:         Soyla Murphy. Renne Crigler, M.D.  Cardiac Catheterization Laboratory  [facility] Medical Records Dept.   Procedure Report  REFERRING PHYSICIAN: Soyla Murphy. Renne Crigler, M.D.  PROCEDURE: Insertion of dual-chamber permanent transvenous pacemaker, DDD mode.  INDICATIONS FOR PROCEDURE: Sick sinus syndrome with tachycardia-bradycardia syndrome and syncope.  ANESTHESIA: Local with 1% lidocaine.  DESCRIPTION OF PROCEDURE: After signing an informed consent, the patient was premedicated with 10 mg of Valium by mouth and transported from his bed at [facility] to the [facility] Electrophysiology Lab on the sixth floor. His right anterior chest and base of neck were prepped and draped in a sterile fashion. A right transverse subcuticular plane was anesthetized locally with 1% lidocaine. An incision was made in this anesthetized plane with the incision being deepened into the fascial layer overlying the pectoralis muscle. A pocket was then formed in the fascial layer for later insertion of the pulse generator. An 8 [place] Cook introducer sheath and a 9 French Cook introducer sheath were both introduced percutaneously into the right subclavian vein. A Pacesetter bipolar ventricular lead was selected, model #1336T, serial [id number]. After proper preparation, the ventricular lead was advanced through the 8 [place] Cook introducer sheath and advanced to the superior vena cava. We then selected a Pacesetter bipolar atrial J lead, model B2359505, serial [id number]. After proper preparation the atrial J lead was inserted through the 9 French Cook introducer sheath and advanced to  the superior vena cava. Both sheaths were removed in the usual fashion. The ventricular lead was then advanced into the right atrium and right ventricle where the tip was positioned in the apex of the right ventricle. Very good pacing parameters were obtained with a minimum voltage threshold of 0.5 volts utilizing 0.6 mA of current. The resistance was measured at 788 ohms and the R wave sensitivity was measured at 7.0 mV. We then advanced the atrial J lead into the right atrium where the tip was positioned anteriorly in the right atrial appendage. Again very good pacing parameters were obtained with a minimum voltage threshold of 0.8 volts utilizing 1.2 mA of current. The resistance was measured at 440 ohms and the P wave sensitivity measured 4.8 mV. After obtaining these pacing parameters, the leads were secured properly at their insertion site. The wound was lavaged profusely with kanamycin solution. We then selected a Pacesetter pulse generator, model Affinity DR, model B5521821, serial [id number]. After properly analyzing, the pulse generator was attached to the pacing electrodes in the usual fashion. The pulse generator was then placed within the previously formed pocket and the wound was closed in layers using 2-0 Dexon. Final skin closure was obtained with a cutaneous layer of Steri-Strips. The patient tolerated the procedure well and no complications were noted. At the end of the procedure, a sterile bulky dressing was applied to the wound and he was returned to his room at [facility] in satisfactory condition.  MEDICATIONS GIVEN: None.  The pacemaker was noted to be functioning normally in the DDD mode. Dictated by:   Aram Candela. Aleen Campi, M.D. Attending Physician:  Londell Moh DD:  04/07/01 TD:  04/07/01 Job: [id number]

## 2010-05-24 NOTE — H&P (Signed)
[facility]  Patient:    Patrick Murray, Patrick Murray Visit Number: [id number] MRN: [id number]          Service Type: MED Location: 3W 0379 01 Attending Physician:  Londell Moh Dictated by:   Soyla Murphy. Renne Crigler, M.D. Admit Date:  04/04/2001                           History and Physical  HISTORY OF PRESENT ILLNESS:  Patrick Murray is an 77 year old married white resident of [place] with a chief complaint of "wiped out."  He states he felt entirely well yesterday.  This morning he was slightly nauseated.  He went to the bathroom.  When he came back, "I must have passed out at the door."  According to his wife, she heard a thump as he fell to the ground.  She felt that he did hit his head.  She called 911.  He was not immediately responsive.  She states he was out for about a minute.  When EMS came, his CBG was 112.  He had no other complaints, did not lose control of his bowels or bladder.  He was not confused after the episode.  He is feeling fine here at the emergency room.  EMS checked his blood pressure.  It was 140/palpable, pulse 80, respirations 18.  PAST HISTORY:  1. Hypercholesterolemia.  2. Hypertension with strong white coat component.  3. Diabetes.  4. He has had right cataract surgery on Wednesday.  PERSONAL HISTORY:  He is retired.  He walks five miles for exercise.  He does not use any ethanol or tobacco.  ALLERGIES:  No known allergies.  CURRENT MEDICATIONS:  1. Avandia 4 mg daily.  2. Glipizide 5 mg q.a.m.  3. Ocuflox.  4. Ketorolac.  5. Pred Forte eyedrops as directed.  6. Zocor 20 mg one half p.o. q.p.m.  7. Irbesartan 150 mg p.o. daily.  8. Hydrochlorothiazide 25 mg p.o. q.a.m.  9. Aspirin 81 mg daily. 10. B complex daily. 11. Fish oil capsules three daily.  FAMILY HISTORY:  Father died of emphysema, age 61.  Mother had a couple of hearet attacks, died at age 57.  He has three children alive and well.   They ar all male.  Two are ministers, one is an Airline pilot.  SYSTEMS REVIEW:  He has no headaches, numbness, tingling, weakness, seizures, or change in hearing or vision.  He has had no skin changes, no known lumps or bumps.  No temperature intolerance.  He is having no runny nose, sore throat, sinus pain, or ear pain.  He is having no masses anywhere in his body.  No difficulty swallowing, no vomiting, diarrhea, constipation, melena, or hematochezia.  There has been no chest pain, shortness of breath, or change in his exercise tolerance.  No orthopnea, PND, or edema has been noted.  No dysuria, hematuria, urgency, or frequency of urination has been noted.  PHYSICAL EXAMINATION:  GENERAL:  He is well-appearing in no acute distress.  VITAL SIGNS:  Blood pressure is 131/60, pulse 73, respirations 20, temperature is 97.1, O2 saturation is 97% on room air.  Supine blood pressure 123/53, standing is 141/72.  SKIN:  Exam shows two nevi.  LYMPH NODES:  There is no cervical, supraclavicular, axillary, or inguinal adenopathy.  HEENT:  Normocephalic, atraumatic.  PERRL.  There is conjunctival hemorrhage on the medial right eye since his eye surgery.  TMs and ear canals appear normal.  Tongue and posterior pharynx appear normal.  Good dentition.  NECK:  Supple.  There is no thyromegaly, no neck masses.  PULSES:  There is 2+ equal carotid radial and femoral pulses with absent posterior tibial pulses and absent left dorsalis pedis pulse.  Trace right dorsalis pedis pulse present.  No carotid bruits.  BREASTS:  No gynecomastia.  LUNGS:  Clear to auscultation.  HEART:  Irregular rhythm with no murmurs, rubs, or gallops.  No JVD, no edema.  ABDOMEN:  Nontender.  Bowel sounds present.  No organomegaly, no masses.  GENITALIA:  Normal circumcised male genitalia and normal scrotum and testicles.  RECTAL:  Exam shows diffuse mild enlargement of the prostate without masses. No rectal masses.   Normal rectal tone.  Stool heme-negative.  EXTREMITIES:  No cyanosis, clubbing, or edema.  NEUROLOGIC:  He is alert and oriented x3 with cranial nerves II-XII intact and equal strength in both lower extremities including EHL and has absent deep tendon reflexes in knees.  Equal grips.  His speech is normal.  Gait is not tested at this time.  IMPRESSION: 1. Syncope, suspect cardiac-related.  However, although no suggestion of    seizure, will check EEG as well while he is here.  Will rule out for    myocardial infarction on telemetry and ask Dr. Aleen Campi of cardiology to    please see him in consultation.  The case was discussed with Dr. Aleen Campi.    Will begin him on IV heparin plus Coumadin therapy. 2. Atrial fibrillation - please see above.  Will check also echocardiogram    and TSH levels. 3. Head trauma after fall according to his wife.  No obvious injury.  Head CT    showed no bleeding. 4. Hypertension. 5. Diabetes mellitus. 6. Hypercholesterolemia. 7. Recent bilateral cataract surgery.  Overall he is very fit for his age. Dictated by:   Soyla Murphy. Renne Crigler, M.D. Attending Physician:  Londell Moh DD:  04/04/01 TD:  04/04/01 Job: [id number]

## 2010-05-24 NOTE — Discharge Summary (Signed)
[facility]  Patient:    Patrick Murray, Patrick Murray Visit Number: [id number] MRN: [id number]          Service Type: OUT Location: CATH Attending Physician:  Silvestre Mesi Dictated by:   Soyla Murphy. Renne Crigler, M.D. Admit Date:  04/07/2001 Discharge Date: 04/07/2001                             Discharge Summary  HOSPITAL COURSE:  Please see admission history and physical for details of [name] presentation.  Briefly, he was admitted with the chief complaint of "wiped out."  Basically, he had a syncopal episode.  He is diabetic, however, his CBG was normal at the time.  He was admitted and placed on telemetry.  He ruled out for a myocardial infarction, and was seen by Dr. Aleen Campi in consultation.  He had multiple significant pauses on his telemetry monitor.  Initially, he had atrial fibrillation also.  However, this resolved rather rapidly and he had no further atrial fibrillation during the hospital stay.  Initially, he was placed on IV heparin plus Coumadin.  However, since he had no recurrence of this the Coumadin and heparin were stopped towards the end of the hospital stay.  On 04/07/01, he underwent insertion of a dual chamber permanent transvenous pacemaker DDD mode for sick sinus syndrome with tachycardia bradycardia syndrome and syncope.  He tolerated this quite well.  He was discharged on 04/08/01.  IMPRESSION:  1. Sick sinus syndrome with tachycardia, bradycardia, and syncope.  2. Pacemaker placement, DDD mode, permanent.  3. Transient atrial fibrillation.  4. Diabetes mellitus type 2.  5. Occasional premature ventricular contractions on EKG.  6. Normal EEG on 04/08/01.  7. Mild mitral valve regurgitation on echocardiogram with mild left atrial     dilatation, left ventricular ejection fraction 55 to 65% in 3/03.  8. Atherosclerotic peripheral vascular disease.     a. A 40 to 60% internal carotid artery stenosis bilaterally.  9. Minimal  anemia with a hemoglobin of 12.9, white count 12.8, platelets     146,000. 10. Hypercholesterolemia. 12. Hypertension.  DISPOSITION:  [name] was discharged to home on his usual eye drops, glipizide 5 mg p.o. q.a.m., Zocor 20 mg 1/2 p.o. q.p.m., Irbesartan 150 mg p.o. q.d., hydrochlorothiazide 25 mg p.o. q.a.m., aspirin 81 mg p.o. q.d., B complex q.d., fish oil three q.d., Avandia 4 mg q.d.  Of note, he is extraordinarily fit for his age. Dictated by:   Soyla Murphy. Renne Crigler, M.D. Attending Physician:  Silvestre Mesi DD:  04/14/01 TD:  04/15/01 Job: [id number]

## 2010-05-24 NOTE — Op Note (Signed)
NAME:  [name], [name] NO.:  [id number]   MEDICAL RECORD NO.:  [id number]          PATIENT TYPE:  AMB   LOCATION:  ENDO                         [facility]AC[facility]   PHYSICIAN:  Georgiana Spinner, M.D.    DATE OF BIRTH:  1917/08/31   DATE OF PROCEDURE:  04/17/2004  DATE OF DISCHARGE:                                 OPERATIVE REPORT   PROCEDURE:  Colonoscopy.   INDICATIONS:  Polyps.   ANESTHESIA:  1.  Demerol 50.  2.  Versed 3 mg.   PROCEDURE:  With the patient mildly sedated in the left lateral decubitus  position and subsequently rolled to his back, a rectal examination was  performed which was unremarkable, and the Olympus video colonoscope was  inserted into the rectum and passed through a tortuous, diverticula-filled  sigmoid colon to the cecum, the cecum identified by the ileocecal valve and  base of cecum, both of which were photographed.  From this point, the  colonoscope was slowly withdrawn, taking circumferential views of the  colonic mucosa, stopping in the rectum, which appeared normal on direct and  retroflexed views.  The endoscope was straightened and withdrawn.  The  patient's vital signs and pulse oximeter remained stable.  The patient  tolerated the procedure well, without apparent complications.   FINDINGS:  Diffusely diverticula-filled sigmoid colon.  Otherwise  unremarkable exam.   PLAN:  Consider repeat examination if clinically relevant in five years.      GMO/MEDQ  D:  04/17/2004  T:  04/17/2004  Job:  [id number]

## 2010-05-30 ENCOUNTER — Encounter: Payer: Self-pay | Admitting: Internal Medicine

## 2010-05-30 DIAGNOSIS — I495 Sick sinus syndrome: Secondary | ICD-10-CM

## 2010-06-10 ENCOUNTER — Encounter: Payer: Self-pay | Admitting: Internal Medicine

## 2010-06-28 ENCOUNTER — Encounter: Payer: Medicare Other | Admitting: Internal Medicine

## 2010-07-31 ENCOUNTER — Telehealth: Payer: Self-pay | Admitting: Internal Medicine

## 2010-07-31 NOTE — Telephone Encounter (Signed)
All Cardiac records faxed to [facility] @ [phone]  07/31/10/km

## 2010-08-02 ENCOUNTER — Emergency Department (HOSPITAL_COMMUNITY)
Admission: EM | Admit: 2010-08-02 | Discharge: 2010-08-02 | Disposition: A | Discharge status: Home / Self Care | Payer: Medicare Other | Attending: Emergency Medicine | Admitting: Emergency Medicine

## 2010-08-02 ENCOUNTER — Emergency Department (HOSPITAL_COMMUNITY): Payer: Medicare Other

## 2010-08-02 DIAGNOSIS — W19XXXA Unspecified fall, initial encounter: Secondary | ICD-10-CM | POA: Insufficient documentation

## 2010-08-02 DIAGNOSIS — S0003XA Contusion of scalp, initial encounter: Secondary | ICD-10-CM | POA: Insufficient documentation

## 2010-08-02 DIAGNOSIS — Z950 Presence of cardiac pacemaker: Secondary | ICD-10-CM | POA: Insufficient documentation

## 2010-08-02 DIAGNOSIS — E119 Type 2 diabetes mellitus without complications: Secondary | ICD-10-CM | POA: Insufficient documentation

## 2010-08-02 DIAGNOSIS — I10 Essential (primary) hypertension: Secondary | ICD-10-CM | POA: Insufficient documentation

## 2010-08-02 DIAGNOSIS — F0390 Unspecified dementia without behavioral disturbance: Secondary | ICD-10-CM | POA: Insufficient documentation

## 2010-08-02 DIAGNOSIS — Y9210 Unspecified residential institution as the place of occurrence of the external cause: Secondary | ICD-10-CM | POA: Insufficient documentation

## 2010-08-02 DIAGNOSIS — S1093XA Contusion of unspecified part of neck, initial encounter: Secondary | ICD-10-CM | POA: Insufficient documentation

## 2010-08-02 DIAGNOSIS — S0990XA Unspecified injury of head, initial encounter: Secondary | ICD-10-CM | POA: Insufficient documentation

## 2010-09-17 ENCOUNTER — Encounter: Payer: Self-pay | Admitting: *Deleted

## 2010-10-03 LAB — CBC
HCT: 34.6 — ABNORMAL LOW
Hemoglobin: 10.5 — ABNORMAL LOW
MCHC: 35.5
MCV: 91.6
Platelets: 120 — ABNORMAL LOW
RBC: 3.22 — ABNORMAL LOW
RDW: 12.7

## 2010-10-03 LAB — CK TOTAL AND CKMB (NOT AT ARMC): Total CK: 215

## 2010-10-03 LAB — POCT I-STAT, CHEM 8
BUN: 36 — ABNORMAL HIGH
Hemoglobin: 11.2 — ABNORMAL LOW
Sodium: 140
TCO2: 20

## 2010-10-03 LAB — IRON AND TIBC
Iron: 59
Saturation Ratios: 21
UIBC: 223

## 2010-10-03 LAB — DIFFERENTIAL
Basophils Absolute: 0
Eosinophils Relative: 0
Lymphocytes Relative: 7 — ABNORMAL LOW
Neutrophils Relative %: 87 — ABNORMAL HIGH

## 2010-10-03 LAB — CARDIAC PANEL(CRET KIN+CKTOT+MB+TROPI)
Total CK: 326 — ABNORMAL HIGH
Troponin I: 0.01

## 2010-10-03 LAB — VITAMIN B12: Vitamin B-12: 483 (ref 211–911)

## 2010-10-03 LAB — RETICULOCYTES
RBC.: 3.62 — ABNORMAL LOW
Retic Count, Absolute: 57.9

## 2010-10-03 LAB — BASIC METABOLIC PANEL
CO2: 25
Calcium: 8.9
Creatinine, Ser: 1.71 — ABNORMAL HIGH
Glucose, Bld: 79

## 2010-10-03 LAB — TSH: TSH: 0.389

## 2010-10-03 LAB — POCT CARDIAC MARKERS
Myoglobin, poc: >500
Operator id: 295021

## 2010-12-19 ENCOUNTER — Encounter: Payer: Self-pay | Admitting: Internal Medicine

## 2011-01-06 ENCOUNTER — Telehealth: Payer: Self-pay | Admitting: Internal Medicine

## 2011-01-06 NOTE — Telephone Encounter (Signed)
New Msg:Pt son/POA calling to inform our office that pt is no longer under the care of [facility]. Pt is continuing to get letters stating that he is past due to get his implanted device checked however pt care is being delivered elsewhere. Please return pt son/POA call to discuss further if necessary.

## 2011-01-06 NOTE — Telephone Encounter (Signed)
Tried to return pt's son call but not available. Made pt inactive in paceart and put note that [facility] is no longer checking pacemaker.

## 2011-02-14 ENCOUNTER — Telehealth: Payer: Self-pay | Admitting: Internal Medicine

## 2011-02-14 NOTE — Telephone Encounter (Signed)
12-19-10 sent past due letter/mt 02-14-11 called pt to set up pacer ck with [facility], pt states he is no longer coming here for device checks, is seeing a dr c/mt

## 2011-10-07 ENCOUNTER — Emergency Department (HOSPITAL_COMMUNITY): Payer: Medicare Other

## 2011-10-07 ENCOUNTER — Encounter (HOSPITAL_COMMUNITY): Payer: Self-pay | Admitting: Emergency Medicine

## 2011-10-07 ENCOUNTER — Inpatient Hospital Stay (HOSPITAL_COMMUNITY)
Admission: EM | Admit: 2011-10-07 | Discharge: 2011-10-12 | DRG: 378 | Disposition: A | Discharge status: Home Health | Payer: Medicare Other | Attending: Internal Medicine | Admitting: Internal Medicine

## 2011-10-07 DIAGNOSIS — D649 Anemia, unspecified: Secondary | ICD-10-CM

## 2011-10-07 DIAGNOSIS — W19XXXA Unspecified fall, initial encounter: Secondary | ICD-10-CM | POA: Diagnosis present

## 2011-10-07 DIAGNOSIS — E785 Hyperlipidemia, unspecified: Secondary | ICD-10-CM | POA: Diagnosis present

## 2011-10-07 DIAGNOSIS — R7989 Other specified abnormal findings of blood chemistry: Secondary | ICD-10-CM

## 2011-10-07 DIAGNOSIS — R2681 Unsteadiness on feet: Secondary | ICD-10-CM | POA: Diagnosis present

## 2011-10-07 DIAGNOSIS — I129 Hypertensive chronic kidney disease with stage 1 through stage 4 chronic kidney disease, or unspecified chronic kidney disease: Secondary | ICD-10-CM | POA: Diagnosis present

## 2011-10-07 DIAGNOSIS — Z8719 Personal history of other diseases of the digestive system: Secondary | ICD-10-CM

## 2011-10-07 DIAGNOSIS — K922 Gastrointestinal hemorrhage, unspecified: Secondary | ICD-10-CM

## 2011-10-07 DIAGNOSIS — K921 Melena: Secondary | ICD-10-CM

## 2011-10-07 DIAGNOSIS — D62 Acute posthemorrhagic anemia: Secondary | ICD-10-CM | POA: Diagnosis present

## 2011-10-07 DIAGNOSIS — Z79899 Other long term (current) drug therapy: Secondary | ICD-10-CM

## 2011-10-07 DIAGNOSIS — N189 Chronic kidney disease, unspecified: Secondary | ICD-10-CM | POA: Diagnosis present

## 2011-10-07 DIAGNOSIS — I10 Essential (primary) hypertension: Secondary | ICD-10-CM | POA: Diagnosis present

## 2011-10-07 DIAGNOSIS — Z23 Encounter for immunization: Secondary | ICD-10-CM

## 2011-10-07 DIAGNOSIS — Z7982 Long term (current) use of aspirin: Secondary | ICD-10-CM

## 2011-10-07 DIAGNOSIS — K254 Chronic or unspecified gastric ulcer with hemorrhage: Principal | ICD-10-CM | POA: Diagnosis present

## 2011-10-07 DIAGNOSIS — Z860100 Personal history of colon polyps, unspecified: Secondary | ICD-10-CM

## 2011-10-07 DIAGNOSIS — R269 Unspecified abnormalities of gait and mobility: Secondary | ICD-10-CM | POA: Diagnosis present

## 2011-10-07 DIAGNOSIS — Y9210 Unspecified residential institution as the place of occurrence of the external cause: Secondary | ICD-10-CM | POA: Diagnosis present

## 2011-10-07 DIAGNOSIS — I4891 Unspecified atrial fibrillation: Secondary | ICD-10-CM

## 2011-10-07 DIAGNOSIS — D72829 Elevated white blood cell count, unspecified: Secondary | ICD-10-CM | POA: Diagnosis present

## 2011-10-07 DIAGNOSIS — R799 Abnormal finding of blood chemistry, unspecified: Secondary | ICD-10-CM | POA: Diagnosis present

## 2011-10-07 DIAGNOSIS — R195 Other fecal abnormalities: Secondary | ICD-10-CM | POA: Diagnosis present

## 2011-10-07 DIAGNOSIS — S0003XA Contusion of scalp, initial encounter: Secondary | ICD-10-CM | POA: Diagnosis present

## 2011-10-07 DIAGNOSIS — Z888 Allergy status to other drugs, medicaments and biological substances status: Secondary | ICD-10-CM

## 2011-10-07 DIAGNOSIS — I672 Cerebral atherosclerosis: Secondary | ICD-10-CM | POA: Diagnosis present

## 2011-10-07 DIAGNOSIS — I495 Sick sinus syndrome: Secondary | ICD-10-CM

## 2011-10-07 DIAGNOSIS — E119 Type 2 diabetes mellitus without complications: Secondary | ICD-10-CM

## 2011-10-07 DIAGNOSIS — Z950 Presence of cardiac pacemaker: Secondary | ICD-10-CM

## 2011-10-07 DIAGNOSIS — F329 Major depressive disorder, single episode, unspecified: Secondary | ICD-10-CM | POA: Diagnosis present

## 2011-10-07 DIAGNOSIS — S0990XA Unspecified injury of head, initial encounter: Secondary | ICD-10-CM

## 2011-10-07 DIAGNOSIS — A048 Other specified bacterial intestinal infections: Secondary | ICD-10-CM | POA: Diagnosis present

## 2011-10-07 DIAGNOSIS — F0150 Vascular dementia without behavioral disturbance: Secondary | ICD-10-CM | POA: Diagnosis present

## 2011-10-07 DIAGNOSIS — F3289 Other specified depressive episodes: Secondary | ICD-10-CM | POA: Diagnosis present

## 2011-10-07 DIAGNOSIS — Z8249 Family history of ischemic heart disease and other diseases of the circulatory system: Secondary | ICD-10-CM

## 2011-10-07 DIAGNOSIS — E860 Dehydration: Secondary | ICD-10-CM | POA: Diagnosis present

## 2011-10-07 HISTORY — DX: Major depressive disorder, single episode, unspecified: F32.9

## 2011-10-07 HISTORY — DX: Depression, unspecified: F32.A

## 2011-10-07 HISTORY — DX: Unspecified fall, initial encounter: W19.XXXA

## 2011-10-07 HISTORY — DX: Unspecified dementia, unspecified severity, without behavioral disturbance, psychotic disturbance, mood disturbance, and anxiety: F03.90

## 2011-10-07 HISTORY — DX: Gastric ulcer, unspecified as acute or chronic, without hemorrhage or perforation: K25.9

## 2011-10-07 LAB — URINALYSIS, ROUTINE W REFLEX MICROSCOPIC
Bilirubin Urine: NEGATIVE
Glucose, UA: NEGATIVE mg/dL
Hgb urine dipstick: NEGATIVE
Ketones, ur: NEGATIVE mg/dL
Nitrite: NEGATIVE
Protein, ur: NEGATIVE mg/dL
Specific Gravity, Urine: 1.016 (ref 1.005–1.030)
Urobilinogen, UA: 0.2 mg/dL (ref 0.0–1.0)
pH: 5 (ref 5.0–8.0)

## 2011-10-07 LAB — URINE MICROSCOPIC-ADD ON

## 2011-10-07 LAB — CBC
HCT: 26.3 % — ABNORMAL LOW (ref 39.0–52.0)
Hemoglobin: 8.9 g/dL — ABNORMAL LOW (ref 13.0–17.0)
MCH: 30.4 pg (ref 26.0–34.0)
MCHC: 33.8 g/dL (ref 30.0–36.0)
MCV: 89.8 fL (ref 78.0–100.0)
Platelets: 116 10*3/uL — ABNORMAL LOW (ref 150–400)
RBC: 2.93 MIL/uL — ABNORMAL LOW (ref 4.22–5.81)
RDW: 12.7 % (ref 11.5–15.5)
WBC: 13.3 10*3/uL — ABNORMAL HIGH (ref 4.0–10.5)

## 2011-10-07 LAB — GLUCOSE, CAPILLARY: Glucose-Capillary: 146 mg/dL — ABNORMAL HIGH (ref 70–99)

## 2011-10-07 LAB — LACTIC ACID, PLASMA: Lactic Acid, Venous: 2.8 mmol/L — ABNORMAL HIGH (ref 0.5–2.2)

## 2011-10-07 LAB — BASIC METABOLIC PANEL
BUN: 99 mg/dL — ABNORMAL HIGH (ref 6–23)
CO2: 17 mEq/L — ABNORMAL LOW (ref 19–32)
Calcium: 8.8 mg/dL (ref 8.4–10.5)
Chloride: 105 mEq/L (ref 96–112)
Creatinine, Ser: 1.97 mg/dL — ABNORMAL HIGH (ref 0.50–1.35)
GFR calc Af Amer: 32 mL/min — ABNORMAL LOW (ref >90)
GFR calc non Af Amer: 28 mL/min — ABNORMAL LOW (ref >90)
Glucose, Bld: 206 mg/dL — ABNORMAL HIGH (ref 70–99)
Potassium: 4.9 mEq/L (ref 3.5–5.1)
Sodium: 139 mEq/L (ref 135–145)

## 2011-10-07 LAB — PROTIME-INR
INR: 1.11 (ref 0.00–1.49)
Prothrombin Time: 14.2 seconds (ref 11.6–15.2)

## 2011-10-07 MED ORDER — HYDROCHLOROTHIAZIDE 25 MG PO TABS
25.0000 mg | ORAL_TABLET | Freq: Every day | ORAL | Status: DC
  Administered 2011-10-07 – 2011-10-12 (×6): 25 mg via ORAL
  Filled 2011-10-07 (×6): qty 1

## 2011-10-07 MED ORDER — SODIUM CHLORIDE 0.9 % IJ SOLN
3.0000 mL | Freq: Two times a day (BID) | INTRAMUSCULAR | Status: DC
  Administered 2011-10-09 – 2011-10-11 (×3): 3 mL via INTRAVENOUS
  Administered 2011-10-11: 10:00:00 via INTRAVENOUS
  Administered 2011-10-12: 3 mL via INTRAVENOUS

## 2011-10-07 MED ORDER — ACETAMINOPHEN 650 MG RE SUPP: 650.0000 mg | Freq: Four times a day (QID) | RECTAL | Status: DC | PRN

## 2011-10-07 MED ORDER — AMLODIPINE BESYLATE 5 MG PO TABS
5.0000 mg | ORAL_TABLET | Freq: Every day | ORAL | Status: DC
  Administered 2011-10-07 – 2011-10-12 (×6): 5 mg via ORAL
  Filled 2011-10-07 (×6): qty 1

## 2011-10-07 MED ORDER — PANTOPRAZOLE SODIUM 40 MG IV SOLR
40.0000 mg | Freq: Once | INTRAVENOUS | Status: AC
  Administered 2011-10-07: 40 mg via INTRAVENOUS
  Filled 2011-10-07: qty 40

## 2011-10-07 MED ORDER — GALANTAMINE HYDROBROMIDE 4 MG PO TABS
16.0000 mg | ORAL_TABLET | Freq: Every day | ORAL | Status: DC
  Administered 2011-10-07 – 2011-10-12 (×6): 16 mg via ORAL
  Filled 2011-10-07 (×6): qty 4

## 2011-10-07 MED ORDER — INFLUENZA VIRUS VACC SPLIT PF IM SUSP
0.5000 mL | INTRAMUSCULAR | Status: AC
  Administered 2011-10-08: 0.5 mL via INTRAMUSCULAR
  Filled 2011-10-07: qty 0.5

## 2011-10-07 MED ORDER — SERTRALINE HCL 50 MG PO TABS
50.0000 mg | ORAL_TABLET | Freq: Every day | ORAL | Status: DC
  Administered 2011-10-07 – 2011-10-12 (×6): 50 mg via ORAL
  Filled 2011-10-07 (×6): qty 1

## 2011-10-07 MED ORDER — LORAZEPAM 2 MG/ML IJ SOLN: 0.5000 mg | INTRAMUSCULAR | Status: DC | PRN

## 2011-10-07 MED ORDER — INSULIN ASPART 100 UNIT/ML ~~LOC~~ SOLN
0.0000 [IU] | Freq: Three times a day (TID) | SUBCUTANEOUS | Status: DC
  Administered 2011-10-08: 1 [IU] via SUBCUTANEOUS

## 2011-10-07 MED ORDER — SODIUM CHLORIDE 0.9 % IV SOLN
INTRAVENOUS | Status: DC
  Administered 2011-10-07: 22:00:00 via INTRAVENOUS
  Administered 2011-10-08: 500 mL via INTRAVENOUS
  Administered 2011-10-08: 18:00:00 via INTRAVENOUS

## 2011-10-07 MED ORDER — ONDANSETRON HCL 4 MG/2ML IJ SOLN: 4.0000 mg | Freq: Four times a day (QID) | INTRAMUSCULAR | Status: DC | PRN

## 2011-10-07 MED ORDER — ACETAMINOPHEN 325 MG PO TABS: 650.0000 mg | ORAL_TABLET | Freq: Four times a day (QID) | ORAL | Status: DC | PRN

## 2011-10-07 MED ORDER — SODIUM CHLORIDE 0.9 % IV BOLUS (SEPSIS)
1000.0000 mL | Freq: Once | INTRAVENOUS | Status: AC
  Administered 2011-10-07: 1000 mL via INTRAVENOUS

## 2011-10-07 MED ORDER — ONDANSETRON HCL 4 MG PO TABS: 4.0000 mg | ORAL_TABLET | Freq: Four times a day (QID) | ORAL | Status: DC | PRN

## 2011-10-07 MED ORDER — CALCITRIOL 0.25 MCG PO CAPS
0.2500 ug | ORAL_CAPSULE | Freq: Every day | ORAL | Status: DC
  Administered 2011-10-07 – 2011-10-12 (×6): 0.25 ug via ORAL
  Filled 2011-10-07 (×6): qty 1

## 2011-10-07 MED ORDER — SODIUM CHLORIDE 0.9 % IV SOLN
8.0000 mg/h | INTRAVENOUS | Status: DC
  Administered 2011-10-07 – 2011-10-08 (×2): 8 mg/h via INTRAVENOUS
  Filled 2011-10-07 (×8): qty 80

## 2011-10-07 MED ORDER — SODIUM CHLORIDE 0.9 % IV SOLN
80.0000 mg | Freq: Once | INTRAVENOUS | Status: DC
  Filled 2011-10-07 (×2): qty 80

## 2011-10-07 NOTE — ED Notes (Signed)
Admitting will have pt on tele

## 2011-10-07 NOTE — H&P (Addendum)
History and Physical Examination  Date: 10/07/2011  Patient name: Patrick Murray Medical record number: [id number] Date of birth: 1917/01/26 Age: 78 y.o. Gender: male PCP: GREEN, Lenon Curt, MD GI: Vida Rigger, MD  Chief Complaint:  Chief Complaint  Patient presents with  . GI Bleeding    Historian: Patient who is a poor historian: Records also taken from ER physician.  History of Present Illness: Patrick Murray is an 78 y.o. male taking aspirin daily who lives in an independent living facility and did not show up for breakfast this morning.  When they came to investigate they found him down on the floor covered in tarry black feces.  The patient reports that he got up to go to the bathroom to urinate and the next day and he remembers is that he noticed blood coming from his bottom.  He reports that he has been having black stools for several days.  The patient was seen for similar episode of GI bleeding 3 years ago.  He had an upper endoscopy that was significant for a moderate gastric ulcer.  He had a colonoscopy in 2006 and diverticulosis was found and polyps.  The patient reports that he has been having no chest pain, shortness of breath or fatigue.  In emergency department the patient was found to be heme positive with black tarry stools.  The patient's hemoglobin remained stable at 8.9 from previous tests.  He had an elevated BUN of 99.  He was started on IV fluid hydration and Protonix.  Medicine consultation was requested for hospital admission.   Past Medical History Past Medical History  Diagnosis Date  . Cardiac pacemaker in situ   . Atrial fibrillation   . Chronic kidney disease, unspecified   . Unspecified essential hypertension   . Other and unspecified hyperlipidemia   . Type II or unspecified type diabetes mellitus without mention of complication, not stated as uncontrolled   . Sinoatrial node dysfunction   . Hypercholesterolemia   . Depression   . Gastric ulcer       Past Surgical History Past Surgical History  Procedure Date  . No past surgeries     Home Meds: Prior to Admission medications   Medication Sig Start Date End Date Taking? Authorizing Provider  losartan (COZAAR) 25 MG tablet Take 25 mg by mouth daily.   Yes Historical Provider, MD  amLODipine (NORVASC) 5 MG tablet Take 5 mg by mouth daily.      Historical Provider, MD  aspirin (ASPIR-81) 81 MG EC tablet Take 81 mg by mouth daily.      Historical Provider, MD  calcitRIOL (ROCALTROL) 0.25 MCG capsule Take 0.25 mcg by mouth. 2 tabs even days 1 tab odd days     Historical Provider, MD  GALANTAMINE HYDROBROMIDE PO Take 16 mg by mouth daily. Take 2 tabs    Historical Provider, MD  glipiZIDE (GLUCOTROL) 5 MG tablet Take 2.5 mg by mouth daily.     Historical Provider, MD  Glucosamine Sulfate POWD by Does not apply route. 1500mg  once daily     Historical Provider, MD  hydrochlorothiazide 25 MG tablet Take 25 mg by mouth daily.      Historical Provider, MD  sertraline (ZOLOFT) 50 MG tablet Take 50 mg by mouth daily.     Historical Provider, MD    Allergies: Simvastatin  Social History:  History   Social History  . Marital Status: Married    Spouse Name: N/A    Number of  Children: N/A  . Years of Education: N/A   Occupational History  . Not on file.   Social History Main Topics  . Smoking status: Never Smoker   . Smokeless tobacco: Never Used  . Alcohol Use: No  . Drug Use: No  . Sexually Active:    Other Topics Concern  . Not on file   Social History Narrative   Retired. Walks 5 miles for exercise. Does not use ethanol.    Family History:  Family History  Problem Relation Age of Onset  . Heart attack Mother     had a couple  . Emphysema Father     Review of Systems: Pertinent items are noted in HPI. Complete review of systems difficult due to patient factors  Physical Exam: Blood pressure 144/57, pulse 94, temperature 98.5 F (36.9 C), temperature source  Oral, resp. rate 22, SpO2 98.00%. Nursing note and vitals reviewed.  Constitutional: He appears well-developed and well-nourished. Pt with dark stool on him literally from head to toe. No gross blood noted.  HENT: Head: Normocephalic and atraumatic.  Eyes: Conjunctivae normal are normal.   Neck: Neck supple.  Cardiovascular: irreg irreg Normal rate and normal heart sounds. No murmur heard. Pulmonary/Chest: Effort normal and breath sounds normal. No respiratory distress.  Abdominal: Soft. He exhibits no distension. There is no tenderness. Melanotic stool. Musculoskeletal: He exhibits no edema and no tenderness.  Small contusion/abrasion to R forehead. No bony tenderness of extremities appreciated. No midline spinal tenderness. ROM of large joints w/o apparent pain.  Neurological: He is alert. No cranial nerve deficit. He exhibits normal muscle tone. Coordination normal.  Moving all extremities equally. No obvious deficits noted. Oriented to self.  Skin: Skin is warm and dry.   Lab  And Imaging results:  Results for orders placed during the hospital encounter of 10/07/11 (from the past 24 hour(s))  CBC     Status: Abnormal   Collection Time   10/07/11 10:50 AM      Component Value Range   WBC 13.3 (*) 4.0 - 10.5 K/uL   RBC 2.93 (*) 4.22 - 5.81 MIL/uL   Hemoglobin 8.9 (*) 13.0 - 17.0 g/dL   HCT 16.1 (*) 09.6 - 04.5 %   MCV 89.8  78.0 - 100.0 fL   MCH 30.4  26.0 - 34.0 pg   MCHC 33.8  30.0 - 36.0 g/dL   RDW 40.9  81.1 - 91.4 %   Platelets 116 (*) 150 - 400 K/uL  BASIC METABOLIC PANEL     Status: Abnormal   Collection Time   10/07/11 10:50 AM      Component Value Range   Sodium 139  135 - 145 mEq/L   Potassium 4.9  3.5 - 5.1 mEq/L   Chloride 105  96 - 112 mEq/L   CO2 17 (*) 19 - 32 mEq/L   Glucose, Bld 206 (*) 70 - 99 mg/dL   BUN 99 (*) 6 - 23 mg/dL   Creatinine, Ser 7.82 (*) 0.50 - 1.35 mg/dL   Calcium 8.8  8.4 - 95.6 mg/dL   GFR calc non Af Amer 28 (*) >90 mL/min   GFR calc Af  Amer 32 (*) >90 mL/min  TYPE AND SCREEN     Status: Normal   Collection Time   10/07/11 10:50 AM      Component Value Range   ABO/RH(D) A POS     Antibody Screen NEG     Sample Expiration 10/10/2011    LACTIC  ACID, PLASMA     Status: Abnormal   Collection Time   10/07/11 12:35 PM      Component Value Range   Lactic Acid, Venous 2.8 (*) 0.5 - 2.2 mmol/L  PROTIME-INR     Status: Normal   Collection Time   10/07/11  2:02 PM      Component Value Range   Prothrombin Time 14.2  11.6 - 15.2 seconds   INR 1.11  0.00 - 1.49  URINALYSIS, ROUTINE W REFLEX MICROSCOPIC     Status: Abnormal   Collection Time   10/07/11  3:00 PM      Component Value Range   Color, Urine YELLOW  YELLOW   APPearance CLOUDY (*) CLEAR   Specific Gravity, Urine 1.016  1.005 - 1.030   pH 5.0  5.0 - 8.0   Glucose, UA NEGATIVE  NEGATIVE mg/dL   Hgb urine dipstick NEGATIVE  NEGATIVE   Bilirubin Urine NEGATIVE  NEGATIVE   Ketones, ur NEGATIVE  NEGATIVE mg/dL   Protein, ur NEGATIVE  NEGATIVE mg/dL   Urobilinogen, UA 0.2  0.0 - 1.0 mg/dL   Nitrite NEGATIVE  NEGATIVE   Leukocytes, UA SMALL (*) NEGATIVE  URINE MICROSCOPIC-ADD ON     Status: Normal   Collection Time   10/07/11  3:00 PM      Component Value Range   WBC, UA 3-6  <3 WBC/hpf   RBC / HPF 0-2  <3 RBC/hpf   Bacteria, UA RARE  RARE   Impression   *GI bleed  DM  HYPERLIPIDEMIA  HYPERTENSION  PAROXYSMAL ATRIAL FIBRILLATION  SICK SINUS SYNDROME  RENAL INSUFFICIENCY, CHRONIC  Cardiac pacemaker in situ  History of gastric ulcer  Melena  Heme positive stool  Fall  Gait instability  Leukocytosis  Anemia  Dehydration  Elevated BUN  Mildly elevated lactic acid  Plan  Admit to telemetry for further evaluation and management Continue Protonix IV drip Consult gastroenterology for evaluation Type and screen Provide IV fluid hydration Check CK level Physical therapy consult Consult social worker Hold aspirin Follow hemoglobin and WBC   Standley Dakins MD [facility] [place], [place] [phone] 10/07/2011, 5:25 PM

## 2011-10-07 NOTE — ED Notes (Signed)
[id number]<AV> Expected date:<BR> Expected time:10:15 AM<BR> Means of arrival:Ambulance<BR> Comments:<BR> GI bleed

## 2011-10-07 NOTE — Progress Notes (Signed)
Clinical Social Work Department BRIEF PSYCHOSOCIAL ASSESSMENT 10/07/2011  Patient:  Patrick Murray, Patrick Murray     Account Number:  [id number]     Admit date:  10/07/2011  Clinical Social Worker:  Doree Albee  Date/Time:  10/07/2011 07:00 PM  Referred by:  RN  Date Referred:  10/07/2011 Referred for  SNF Placement  SNF Placement   Other Referral:   Interview type:  Patient Other interview type:   and pt son    PSYCHOSOCIAL DATA Living Status:  FACILITY Admitted from facility:  [facility] Level of care:  Independent Living Primary support name:  Braelyn Bordonaro Primary support relationship to patient:  CHILD, ADULT Degree of support available:   Francina Ames son    both very supportive    CURRENT CONCERNS Current Concerns  Post-Acute Placement   Other Concerns:    SOCIAL WORK ASSESSMENT / PLAN CSW met with pt and pt son at bedside to complete psychosocial assessment and assist wtih pt dc plans. Per chart review, pt is being admitted as inpatient.    Pt and pt son shared that pt is a resident at [facility]. Per chart review, pt was found down on the ground and may have been there all night. pt is oriented to self and place at this time, however had moments on confusion during conversation.    CSW explained to pt and pt family that pt will need to work with physical therapy while in the hosptial to help determine if patient will need further assistance when pt is medically stable to leave the hosptial.    Pt son verbalized understanding that pt may need high level of care. Pt son is hopeful for patient to return to apartment at [facility] and participate in therapy at the heath care center on in apartment.    Pt son stated that patient is very sensitive to hearing the words "assisted living".    ED CSW will inform Unit CSW of pt potential dispsotion needs.   Assessment/plan status:  Psychosocial Support/Ongoing Assessment of  Needs Other assessment/ plan:   and discharge planning   Information/referral to community resources:   none identified at this time, pt well connected to [employer], may need snf list if pt recommended for short term rehab.    PATIENT'S/FAMILY'S RESPONSE TO PLAN OF CARE: Pt and pt son thanked csw for concern and support. Pt stated, "I wont be a lousy patient."  Pt and pt son hopeful for pt to return to apt when medically stable but open to skilled at [facility] if needed.       Catha Gosselin, LCSWA  [phone] .10/07/2011 1908pm

## 2011-10-07 NOTE — Progress Notes (Signed)
Patrick Murray, is a 78 y.o. male,   MRN: [id number]  -  DOB - [birth year]  Outpatient Primary MD for the patient is GREEN, Lenon Curt, MD  in for    Chief Complaint  Patient presents with  . GI Bleeding     Blood pressure 144/57, pulse 80, temperature 98.5 F (36.9 C), temperature source Oral, resp. rate 20, SpO2 95.00%.  Principal Problem:  *GI bleed Active Problems:  DM  HYPERTENSION  SICK SINUS SYNDROME  RENAL INSUFFICIENCY, CHRONIC  Cardiac pacemaker in situ         78 yo hx dementia, HTN, CKD sick sinus syndrome presents to [facility] after being found at independent living quarters covered in blood and feces. Reportedly stool very dark.  Reportedly has been in usual state of health and dementia is at baseline.   Work up in ED yields creatinine 1.9 which seem to be baseline and BUN 99 which is elevated from baseline. WC 13.3, lactic acid 2.8.   On exam pt alert, oriented to self only, pale, well nourished. NAD. VSS. Son at bedside indicates pt had colonoscopy 2011. Unsure of results but states "he had an ulcer".   Will admit to tele.

## 2011-10-07 NOTE — ED Notes (Signed)
Pt got up out of bed, pulled out R hand IV and heart monitor.  Went back to the bed after persuasion

## 2011-10-07 NOTE — ED Notes (Signed)
Dr. Juleen China authorized foot stick

## 2011-10-07 NOTE — ED Provider Notes (Signed)
History    89yM brought in for evaluation after being found down. Lives in independent living and checked on this morning when didn't show up for breakfast. Pt apparently found covered in dark feces and blood. No gross blood noted on pt though on arrival. Pt with hx of dementia. Is at his baseline per report. Pt unsure as to what happened. Denies pain anywhere. No n/v. No SOB. NO urinary complaints but reliability of hx questionable.  CSN: [id number]  Arrival date & time 10/07/11  1020   First MD Initiated Contact with Patient 10/07/11 1022      Chief Complaint  Patient presents with  . GI Bleeding    (Consider location/radiation/quality/duration/timing/severity/associated sxs/prior treatment) HPI  Past Medical History  Diagnosis Date  . Cardiac pacemaker in situ   . Atrial fibrillation   . Chronic kidney disease, unspecified   . Unspecified essential hypertension   . Other and unspecified hyperlipidemia   . Type II or unspecified type diabetes mellitus without mention of complication, not stated as uncontrolled   . Sinoatrial node dysfunction   . Hypercholesterolemia   . Depression     History reviewed. No pertinent past surgical history.  Family History  Problem Relation Age of Onset  . Heart attack Mother     had a couple  . Emphysema Father     History  Substance Use Topics  . Smoking status: Never Smoker   . Smokeless tobacco: Not on file  . Alcohol Use:       Review of Systems  Level 5 caveat applies because pt is demented.   Allergies  Simvastatin  Home Medications   Current Outpatient Rx  Name Route Sig Dispense Refill  . LOSARTAN POTASSIUM 25 MG PO TABS Oral Take 25 mg by mouth daily.    Marland Kitchen AMLODIPINE BESYLATE 5 MG PO TABS Oral Take 5 mg by mouth daily.      . ASPIRIN 81 MG PO TBEC Oral Take 81 mg by mouth daily.      Marland Kitchen CALCITRIOL 0.25 MCG PO CAPS Oral Take 0.25 mcg by mouth. 2 tabs even days 1 tab odd days     . GALANTAMINE HYDROBROMIDE PO Oral  Take 16 mg by mouth daily. Take 2 tabs    . GLIPIZIDE 5 MG PO TABS Oral Take 2.5 mg by mouth daily.     Marland Kitchen GLUCOSAMINE SULFATE POWD Does not apply by Does not apply route. 1500mg  once daily     . HYDROCHLOROTHIAZIDE 25 MG PO TABS Oral Take 25 mg by mouth daily.      . SERTRALINE HCL 50 MG PO TABS Oral Take 50 mg by mouth daily.       BP 109/73  Pulse 80  Temp 97.5 F (36.4 C) (Oral)  Resp 16  SpO2 99%  Physical Exam  Nursing note and vitals reviewed. Constitutional: He appears well-developed and well-nourished.       Pt with dark stool on him literally from head to toe. No gross blood noted.  HENT:  Head: Normocephalic and atraumatic.  Eyes: Conjunctivae normal are normal. Right eye exhibits no discharge. Left eye exhibits no discharge.  Neck: Neck supple.  Cardiovascular: Normal rate and normal heart sounds.  Exam reveals no gallop and no friction rub.   No murmur heard.      irreg irreg  Pulmonary/Chest: Effort normal and breath sounds normal. No respiratory distress.  Abdominal: Soft. He exhibits no distension. There is no tenderness.  Genitourinary:  Melanotic stool. Poor rectal tone. No gross blood. Occult positive.   Musculoskeletal: He exhibits no edema and no tenderness.       Small contusion/abrasion to R forehead, otherwise no external signs of trauma. No bony tenderness of extremities appreciated. No midline spinal tenderness. ROM of large joints w/o apparent pain.  Neurological: He is alert. No cranial nerve deficit. He exhibits normal muscle tone. Coordination normal.       Moving all extremities equally. No obvious deficits noted. Oriented to self.  Skin: Skin is warm and dry.    ED Course  Procedures (including critical care time)  Labs Reviewed  CBC - Abnormal; Notable for the following:    WBC 13.3 (*)  WHITE COUNT CONFIRMED ON SMEAR   RBC 2.93 (*)     Hemoglobin 8.9 (*)     HCT 26.3 (*)     Platelets 116 (*)     All other components within normal  limits  BASIC METABOLIC PANEL - Abnormal; Notable for the following:    CO2 17 (*)     Glucose, Bld 206 (*)     BUN 99 (*)     Creatinine, Ser 1.97 (*)     GFR calc non Af Amer 28 (*)     GFR calc Af Amer 32 (*)     All other components within normal limits  URINALYSIS, ROUTINE W REFLEX MICROSCOPIC - Abnormal; Notable for the following:    APPearance CLOUDY (*)     Leukocytes, UA SMALL (*)     All other components within normal limits  LACTIC ACID, PLASMA - Abnormal; Notable for the following:    Lactic Acid, Venous 2.8 (*)     All other components within normal limits  TYPE AND SCREEN  PROTIME-INR  URINE MICROSCOPIC-ADD ON   Ct Head Wo Contrast  10/07/2011  *RADIOLOGY REPORT*  Clinical Data: Nausea with vomiting.  Dementia.  Right frontal swelling.  CT HEAD WITHOUT CONTRAST  Technique:  Contiguous axial images were obtained from the base of the skull through the vertex without contrast.  Comparison: 08/02/2010  Findings: The patient had difficulty remaining motionless for the study.  Images are suboptimal.  Small or subtle lesions could be overlooked.  There is no evidence for acute infarction, intracranial hemorrhage, mass lesion, hydrocephalus, or extra-axial fluid.  Severe atrophy. Chronic microvascular ischemic change.  Right frontal soft tissue swelling with a scalp hematoma.  No skull fracture or sinus air fluid levels. 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  Moderate vascular calcification. Similar appearance to priors.  IMPRESSION: Chronic changes of atrophy and small vessel disease consistent with the history of dementia.  Right frontal scalp hematoma without skull fracture or intracranial hemorrhage.  Chronic sinus changes.   Original Report Authenticated By: Elsie Stain, M.D.    EKG:  Rhythm: a fib Rate: 92 Axis: normal Intervals: QTc ST segments: TWI inferiorly and laterally which was noted on previous   1. GI bleed   2. Closed head  injury       MDM  89yM found down at home. Unknown reason why. Pt covered in melena on arrival with evidence of head trauma. Stool is occult positive. H/H stable from previous, but significant elevation in BUN. No blood thinners aside from 81mg  ASA. CT head without evidence of serious acute traumatic injury.  Pt stays in independent living environment. Unknown as to weather had syncopal episode or not and evidence of GI bleed. Do not feel  pt is medically stable for discharge at this time. Discussed with hospitalist for admission.         Raeford Razor, MD 10/07/11 [phone]

## 2011-10-07 NOTE — ED Notes (Signed)
When cleaning pt, found that pt had 2 1cm ulcers on either side of anus, and a 1" laceration across penis.

## 2011-10-07 NOTE — ED Notes (Signed)
Nurse not available to take report. Will recall report soon. Contact # left for nurse to call back. Will continue to monitor pt.

## 2011-10-07 NOTE — ED Notes (Signed)
[facility] from [facility].  Pt did not come down for breakfast, when staff checked on pt, pt was covered in blood and feces.  Pt has hx has dementia and is oriented per norm.

## 2011-10-08 ENCOUNTER — Encounter (HOSPITAL_COMMUNITY)
Admission: EM | Disposition: A | Discharge status: Home Health | Payer: Self-pay | Source: Home / Self Care | Attending: Internal Medicine

## 2011-10-08 HISTORY — PX: ESOPHAGOGASTRODUODENOSCOPY: SHX5428

## 2011-10-08 LAB — GLUCOSE, CAPILLARY
Glucose-Capillary: 135 mg/dL — ABNORMAL HIGH (ref 70–99)
Glucose-Capillary: 118 mg/dL — ABNORMAL HIGH (ref 70–99)
Glucose-Capillary: 109 mg/dL — ABNORMAL HIGH (ref 70–99)
Glucose-Capillary: 119 mg/dL — ABNORMAL HIGH (ref 70–99)

## 2011-10-08 LAB — BASIC METABOLIC PANEL
BUN: 98 mg/dL — ABNORMAL HIGH (ref 6–23)
CO2: 19 mEq/L (ref 19–32)
Chloride: 112 mEq/L (ref 96–112)
GFR calc non Af Amer: 25 mL/min — ABNORMAL LOW (ref >90)
Glucose, Bld: 139 mg/dL — ABNORMAL HIGH (ref 70–99)
Potassium: 3.8 mEq/L (ref 3.5–5.1)

## 2011-10-08 LAB — MRSA PCR SCREENING: MRSA by PCR: NEGATIVE

## 2011-10-08 LAB — CBC
Hemoglobin: 6.5 g/dL — CL (ref 13.0–17.0)
MCHC: 34.2 g/dL (ref 30.0–36.0)
WBC: 7.2 10*3/uL (ref 4.0–10.5)

## 2011-10-08 LAB — TROPONIN I: Troponin I: <0.3 ng/mL (ref <0.30)

## 2011-10-08 SURGERY — EGD (ESOPHAGOGASTRODUODENOSCOPY)
Anesthesia: Moderate Sedation

## 2011-10-08 MED ORDER — INSULIN ASPART 100 UNIT/ML ~~LOC~~ SOLN
0.0000 [IU] | Freq: Two times a day (BID) | SUBCUTANEOUS | Status: DC
  Administered 2011-10-09 – 2011-10-11 (×5): 1 [IU] via SUBCUTANEOUS
  Administered 2011-10-11: 2 [IU] via SUBCUTANEOUS
  Administered 2011-10-12: 1 [IU] via SUBCUTANEOUS
  Filled 2011-10-08: qty 3

## 2011-10-08 MED ORDER — FENTANYL CITRATE 0.05 MG/ML IJ SOLN
INTRAMUSCULAR | Status: AC
  Filled 2011-10-08: qty 2

## 2011-10-08 MED ORDER — MIDAZOLAM HCL 10 MG/2ML IJ SOLN
INTRAMUSCULAR | Status: DC | PRN
  Administered 2011-10-08 (×2): 1 mg via INTRAVENOUS

## 2011-10-08 MED ORDER — FENTANYL CITRATE 0.05 MG/ML IJ SOLN
INTRAMUSCULAR | Status: DC | PRN
  Administered 2011-10-08 (×2): 10 ug via INTRAVENOUS

## 2011-10-08 MED ORDER — MIDAZOLAM HCL 10 MG/2ML IJ SOLN
INTRAMUSCULAR | Status: AC
  Filled 2011-10-08: qty 2

## 2011-10-08 MED ORDER — BUTAMBEN-TETRACAINE-BENZOCAINE 2-2-14 % EX AERO
INHALATION_SPRAY | CUTANEOUS | Status: DC | PRN
  Administered 2011-10-08: 2 via TOPICAL

## 2011-10-08 NOTE — Consult Note (Signed)
[facility] Consultation Note  Referring Provider: Standley Dakins, MD [facility]) Primary Care Physician:  Kimber Relic, MD  Reason for Consultation:  Melena, anemia  HPI: Patrick Murray is a 78 y.o. male who we've been asked to see for melena and anemia. Apparently patient had syncopal event yesterday and was found down in pool of melena.  Similar presentation about 3 years ago, and gastric ulcer was seen per patient.  Patient denies abdominal pain or hematemesis.  ASA 81/day but no other known NSAIDs.   Past Medical History  Diagnosis Date  . Cardiac pacemaker in situ   . Atrial fibrillation   . Chronic kidney disease, unspecified   . Unspecified essential hypertension   . Other and unspecified hyperlipidemia   . Type II or unspecified type diabetes mellitus without mention of complication, not stated as uncontrolled   . Sinoatrial node dysfunction   . Hypercholesterolemia   . Depression   . Gastric ulcer   . Dementia     Past Surgical History  Procedure Date  . No past surgeries     Prior to Admission medications   Medication Sig Start Date End Date Taking? Authorizing Provider  losartan (COZAAR) 25 MG tablet Take 25 mg by mouth daily.   Yes Historical Provider, MD  amLODipine (NORVASC) 5 MG tablet Take 5 mg by mouth daily.      Historical Provider, MD  aspirin (ASPIR-81) 81 MG EC tablet Take 81 mg by mouth daily.      Historical Provider, MD  calcitRIOL (ROCALTROL) 0.25 MCG capsule Take 0.25 mcg by mouth. 2 tabs even days 1 tab odd days     Historical Provider, MD  GALANTAMINE HYDROBROMIDE PO Take 16 mg by mouth daily. Take 2 tabs    Historical Provider, MD  glipiZIDE (GLUCOTROL) 5 MG tablet Take 2.5 mg by mouth daily.     Historical Provider, MD  Glucosamine Sulfate POWD by Does not apply route. 1500mg  once daily     Historical Provider, MD  hydrochlorothiazide 25 MG tablet Take 25 mg by mouth daily.      Historical Provider, MD  sertraline (ZOLOFT) 50 MG  tablet Take 50 mg by mouth daily.     Historical Provider, MD    Current Facility-Administered Medications  Medication Dose Route Frequency Provider Last Rate Last Dose  . 0.9 %  sodium chloride infusion   Intravenous Continuous Clanford Cyndie Mull, MD 75 mL/hr at 10/07/11 2223    . acetaminophen (TYLENOL) tablet 650 mg  650 mg Oral Q6H PRN Clanford Cyndie Mull, MD       Or  . acetaminophen (TYLENOL) suppository 650 mg  650 mg Rectal Q6H PRN Clanford L Johnson, MD      . amLODipine (NORVASC) tablet 5 mg  5 mg Oral Daily Clanford Cyndie Mull, MD   5 mg at 10/07/11 2223  . calcitRIOL (ROCALTROL) capsule 0.25 mcg  0.25 mcg Oral Daily Clanford Cyndie Mull, MD   0.25 mcg at 10/07/11 2225  . galantamine (RAZADYNE) tablet 16 mg  16 mg Oral Daily Clanford L Johnson, MD   16 mg at 10/07/11 2225  . hydrochlorothiazide (HYDRODIURIL) tablet 25 mg  25 mg Oral Daily Clanford Cyndie Mull, MD   25 mg at 10/07/11 2224  . influenza  inactive virus vaccine (FLUZONE/FLUARIX) injection 0.5 mL  0.5 mL Intramuscular Tomorrow-1000 Clanford L Johnson, MD      . insulin aspart (novoLOG) injection 0-9 Units  0-9 Units Subcutaneous TID WC Clanford L  Laural Benes, MD   1 Units at 10/08/11 [phone]  . LORazepam (ATIVAN) injection 0.5 mg  0.5 mg Intravenous Q4H PRN Clanford Cyndie Mull, MD      . ondansetron (ZOFRAN) tablet 4 mg  4 mg Oral Q6H PRN Clanford Cyndie Mull, MD       Or  . ondansetron (ZOFRAN) injection 4 mg  4 mg Intravenous Q6H PRN Clanford L Johnson, MD      . pantoprazole (PROTONIX) 80 mg in sodium chloride 0.9 % 250 mL infusion  8 mg/hr Intravenous Continuous Raeford Razor, MD 25 mL/hr at 10/07/11 1459 8 mg/hr at 10/07/11 1459  . pantoprazole (PROTONIX) injection 40 mg  40 mg Intravenous Once Raeford Razor, MD   40 mg at 10/07/11 1256   Followed by  . pantoprazole (PROTONIX) injection 40 mg  40 mg Intravenous Once Raeford Razor, MD   40 mg at 10/07/11 1333  . sertraline (ZOLOFT) tablet 50 mg  50 mg Oral Daily Clanford Cyndie Mull,  MD   50 mg at 10/07/11 2224  . sodium chloride 0.9 % bolus 1,000 mL  1,000 mL Intravenous Once Raeford Razor, MD   1,000 mL at 10/07/11 1102  . sodium chloride 0.9 % injection 3 mL  3 mL Intravenous Q12H Clanford L Johnson, MD      . DISCONTD: pantoprazole (PROTONIX) 80 mg in sodium chloride 0.9 % 100 mL IVPB  80 mg Intravenous Once Raeford Razor, MD        Allergies as of 10/07/2011 - Review Complete 10/07/2011  Allergen Reaction Noted  . Simvastatin      Family History  Problem Relation Age of Onset  . Heart attack Mother     had a couple  . Emphysema Father     History   Social History  . Marital Status: Married    Spouse Name: N/A    Number of Children: N/A  . Years of Education: N/A   Occupational History  . Not on file.   Social History Main Topics  . Smoking status: Never Smoker   . Smokeless tobacco: Never Used  . Alcohol Use: No  . Drug Use: No  . Sexually Active:    Other Topics Concern  . Not on file   Social History Narrative   Retired. Walks 5 miles for exercise. Does not use ethanol.     Review of Systems: As per HPI  Physical Exam: Vital signs in last 24 hours: Temp:  [97.4 F (36.3 C)-100.9 F (38.3 C)] 97.4 F (36.3 C) (10/02 0600) Pulse Rate:  [77-94] 77  (10/02 0600) Resp:  [16-22] 20  (10/02 0600) BP: (109-151)/(42-73) 115/42 mmHg (10/02 0600) SpO2:  [95 %-100 %] 99 % (10/02 0600) Weight:  [70.3 kg (154 lb 15.7 oz)] 70.3 kg (154 lb 15.7 oz) (10/01 2147) Last BM Date: 10/07/11 General:   Presbyacusis, younger-appearing than stated age Head:  Right frontal head trauma; otherwise Normocephalic and atraumatic. Eyes:  Sclera clear, no icterus.   Conjunctiva pink. Ears:  Presbyacusis Nose:  No deformity, discharge,  or lesions. Mouth:  No deformity or lesions.  Oropharynx pink & moist. Neck:  Supple Lungs:  Clear throughout to auscultation.   No wheezes, crackles, or rhonchi. No acute distress. Heart:  Regular rate and rhythm; no murmurs,  clicks, rubs,  or gallops. Abdomen:  Soft, nontender and nondistended. No masses, hepatosplenomegaly or hernias noted. Normal bowel sounds, without guarding, and without rebound.     Msk:  Symmetrical without gross deformities. Normal  posture. Pulses:  Normal pulses noted. Extremities:  Without clubbing or edema. Neurologic:  Alert and  oriented x4;  Chronically mildly demented, otherwise grossly normal neurologically. Psych:  Alert and cooperative. Normal mood and affect.   Lab Results:  Basename 10/08/11 0530 10/07/11 1050  WBC 7.2 13.3*  HGB 6.5* 8.9*  HCT 18.8* 26.3*  PLT 134* 116*   BMET  Basename 10/08/11 0356 10/07/11 1050  NA 141 139  K 3.8 4.9  CL 112 105  CO2 19 17*  GLUCOSE 139* 206*  BUN 98* 99*  CREATININE 2.15* 1.97*  CALCIUM 8.3* 8.8   LFT No results found for this basename: PROT,ALBUMIN,AST,ALT,ALKPHOS,BILITOT,BILIDIR,IBILI in the last 72 hours PT/INR  Basename 10/07/11 1402  LABPROT 14.2  INR 1.11    Studies/Results: Ct Head Wo Contrast  10/07/2011  *RADIOLOGY REPORT*  Clinical Data: Nausea with vomiting.  Dementia.  Right frontal swelling.  CT HEAD WITHOUT CONTRAST  Technique:  Contiguous axial images were obtained from the base of the skull through the vertex without contrast.  Comparison: 08/02/2010  Findings: The patient had difficulty remaining motionless for the study.  Images are suboptimal.  Small or subtle lesions could be overlooked.  There is no evidence for acute infarction, intracranial hemorrhage, mass lesion, hydrocephalus, or extra-axial fluid.  Severe atrophy. Chronic microvascular ischemic change.  Right frontal soft tissue swelling with a scalp hematoma.  No skull fracture or sinus air fluid levels. 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  Moderate vascular calcification. Similar appearance to priors.  IMPRESSION: Chronic changes of atrophy and small vessel disease consistent with the history of dementia.  Right  frontal scalp hematoma without skull fracture or intracranial hemorrhage.  Chronic sinus changes.   Original Report Authenticated By: Elsie Stain, M.D.    Impression:  1.  Melena.  History gastric ulcer.  Hemodynamically stable. 2.  Acute blood loss anemia. 3.  Syncope with facial trauma (as above) but no intracranial problems; syncope likely secondary #1 and #2 above.  Plan:  1.  Needs blood transfusion 2 units today. 2.  Protonix. 3.  EGD, tentatively for later this afternoon, once he has received his blood transfusions.   LOS: 1 day   Tanicka Bisaillon M  10/08/2011, 9:35 AM

## 2011-10-08 NOTE — Progress Notes (Signed)
PT Cancel note:  Noted pts HGB to be 6.5, called to unit in afternoon, RN states that he is now being transfused, however will be going to Endo for EGD.  Will check back on pt tomorrow.    Thanks,  Clovia Cuff, PT [phone]

## 2011-10-08 NOTE — Progress Notes (Signed)
MD Izola Price texted regarding patient's H&H of 6.5/18.8 (yesterday was 8.9/26.3). Awaiting response.

## 2011-10-08 NOTE — Progress Notes (Signed)
Report given to Toniann Fail, RN to assume care of patient. Earnest Conroy. Clelia Croft, RN

## 2011-10-08 NOTE — Interval H&P Note (Signed)
History and Physical Interval Note:  10/08/2011 3:49 PM  Patrick Murray  has presented today for surgery, with the diagnosis of melena, anemia  The various methods of treatment have been discussed with the patient and family. After consideration of risks, benefits and other options for treatment, the patient has consented to  Procedure(s) (LRB) with comments: ESOPHAGOGASTRODUODENOSCOPY (EGD) (N/A) as a surgical intervention .  The patient's history has been reviewed, patient examined, no change in status, stable for surgery.  I have reviewed the patient's chart and labs.  Questions were answered to the patient's satisfaction.     [name]  Assessment:  1.  Anemia. 2.  Melena.  Plan:  1.  Endoscopy for further evaluation. 2.  Risks (bleeding, infection, bowel perforation that could require surgery, sedation-related changes in cardiopulmonary systems), benefits (identification and possible treatment of source of symptoms, exclusion of certain causes of symptoms), and alternatives (watchful waiting, radiographic imaging studies, empiric medical treatment) of upper endoscopy (EGD) were explained to patient/son in detail.

## 2011-10-08 NOTE — Op Note (Signed)
[facility] [address] [place], [address]   ENDOSCOPY PROCEDURE REPORT  PATIENT: Patrick Murray, Patrick Murray  MR#: [id number] BIRTHDATE: 11/02/1917 , 89  yrs. old GENDER: Male ENDOSCOPIST: Willis Modena, MD REFERRED BY:  Danie Binder, MD [facility])  ; Murray Hodgkins , MD PROCEDURE DATE:  10/08/2011 PROCEDURE:  EGD, diagnostic ASA CLASS:     Class III INDICATIONS:  acute post hemorrhagic anemia.   melena. MEDICATIONS: Fentanyl 20 mcg IV and Versed 2 mg IV TOPICAL ANESTHETIC: Cetacaine Spray  DESCRIPTION OF PROCEDURE: After the risks benefits and alternatives of the procedure were thoroughly explained, informed consent was obtained.  The pediatric Olympus 190   endoscope was introduced through the mouth and advanced to the second portion of the duodenum. Without limitations.  The instrument was slowly withdrawn as the mucosa was fully examined.     Findings: Normal-appearing esophagus.  Couple antral erosions and gastritis.  In pre-pyloric antrum, there was dominant ulcer about 8mm in diameter; there was adherent hematin to the base of the ulcer but no obvious  visible vessel and  no active bleeding. Duodenitis and erythema in the bulb.  Large descending duodenal diverticulum.  Otherwise normal endoscopy to the second portion of the duodenum.      Retroflexion into cardia was normal. The scope was then withdrawn from the patient and the procedure completed.  ENDOSCOPIC IMPRESSION:     As above.  Anemia, melena, syncope, all likely ultimately due antral ulcer.  RECOMMENDATIONS:     1.  Watch for potential complications of procedure. 2.  Check H. pylori serologies. 3.  Clear liquid diet today, advance tomorrow if no further bleeding. 4.  Protonix IV Q 12 hours today, transition to oral therapy likely tomorrow. 5.  Avoid ASA/NSAIDs as clinically feasible. 6.  [facility] inpatient team to follow.  eSigned:  Willis Modena, MD 10/08/2011 4:25 PM   CC:

## 2011-10-08 NOTE — Progress Notes (Signed)
On-call MD notified that pt admitted to unit in a.fib, HR -98.  Earnest Conroy. Clelia Croft, RN

## 2011-10-08 NOTE — Progress Notes (Signed)
Patient ID: Patrick Murray, male   DOB: 1917/04/17, 78 y.o.   MRN: [id number]  [facility] PROGRESS NOTE  KYSHAWN TEAL WUJ:[id number] DOB: 01/28/1917 DOA: 10/07/2011 PCP: Kimber Relic, MD  Brief narrative: Patient is 78 year old male, resident of independent living facility who presented to emergency department with main concern of tarry black stool. Patient reported one episode earlier the morning of admission, but has had several episodes of similar problem over the past 2 weeks prior to admission. Patient reported history of similar events and endoscopy indicated gastric ulcer as being the source of this bleed. Patient admitted 10/07/2011 for further evaluation and management.   Principal Problem:  *GI bleed - Unclear etiology and possibly related to gastric ulcer - Plan is to proceed to endoscopy today - Appreciate GI input - Transfuse 2 units of PRBCs today prior to EGD - CBC in morning  Active Problems:  DM - Stable - Continue sliding scale insulin   HYPERTENSION - stable - continue to monitor vitals per floor protocol   PAROXYSMAL ATRIAL FIBRILLATION - in sinus rhythm this AM - HR well controlled   RENAL INSUFFICIENCY, CHRONIC - slightly worsening creatinine since yesterday  - likely of pre renal etiology imposed on chronic renal failure - BMP in AM   Anemia - please see above - EGD today - CBC in AM - 2 units for transfusion ordered this AM  Consultants:  Dr. Dulce Sellar - GI  Procedures/Studies: Ct Head Wo Contrast 10/07/2011    IMPRESSION:  Chronic changes of atrophy and small vessel disease consistent with the history of dementia.  Right frontal scalp hematoma without skull fracture or intracranial hemorrhage.  Chronic sinus changes.     Antibiotics:  none  Code Status: Full Family Communication: Pt at bedside, sone at bedisde Disposition Plan: For EGD today  HPI/Subjective: No events overnight.   Objective: Filed Vitals:   10/08/11 1212 10/08/11 1315 10/08/11 1345 10/08/11 1425  BP: 116/51 110/59 132/61   Pulse: 89 98 78 78  Temp: 97.7 F (36.5 C) 97.9 F (36.6 C) 98 F (36.7 C) 98.2 F (36.8 C)  TempSrc: Oral Oral Oral Oral  Resp: 18 18 18 14   Height:      Weight:      SpO2:    98%    Intake/Output Summary (Last 24 hours) at 10/08/11 1550 Last data filed at 10/08/11 1330  Gross per 24 hour  Intake    150 ml  Output    775 ml  Net   -625 ml    Exam:   General:  Pt is alert, follows commands appropriately, not in acute distress, skin pallor noted  Cardiovascular: Regular rate and rhythm, S1/S2, no murmurs, no rubs, no gallops  Respiratory: Clear to auscultation bilaterally, no wheezing, no crackles, no rhonchi  Abdomen: Soft, non tender, non distended, bowel sounds present, no guarding  Extremities: No edema, pulses DP and PT palpable bilaterally  Neuro: Grossly nonfocal  Data Reviewed: Basic Metabolic Panel:  Lab 10/08/11 9562 10/07/11 1050  NA 141 139  K 3.8 4.9  CL 112 105  CO2 19 17*  GLUCOSE 139* 206*  BUN 98* 99*  CREATININE 2.15* 1.97*  CALCIUM 8.3* 8.8  MG -- --  PHOS -- --   CBC:  Lab 10/08/11 0530 10/07/11 1050  WBC 7.2 13.3*  NEUTROABS -- --  HGB 6.5* 8.9*  HCT 18.8* 26.3*  MCV 89.2 89.8  PLT 134* 116*   Cardiac Enzymes:  Lab 10/08/11 0945  10/08/11 0356 10/07/11 2204 10/07/11 1050  CKTOTAL -- 77 -- 82  CKMB -- -- -- --  CKMBINDEX -- -- -- --  TROPONINI <0.30 <0.30 <0.30 --   BNP: No components found with this basename: POCBNP:5 CBG:  Lab 10/08/11 1458 10/08/11 1159 10/08/11 0759 10/07/11 2154  GLUCAP 109* 118* 135* 146*    Recent Results (from the past 240 hour(s))  MRSA PCR SCREENING     Status: Normal   Collection Time   10/07/11 11:29 PM      Component Value Range Status Comment   MRSA by PCR NEGATIVE  NEGATIVE Final      Scheduled Meds:   . amLODipine  5 mg Oral Daily  . calcitRIOL  0.25 mcg Oral Daily  . galantamine  16 mg Oral  Daily  . hydrochlorothiazide  25 mg Oral Daily  . insulin aspart  0-9 Units Subcutaneous TID WC  . sertraline  50 mg Oral Daily   Continuous Infusions:   . sodium chloride 500 mL (10/08/11 1455)  . pantoprozole (PROTONIX) infusion 8 mg/hr (10/07/11 1459)     Debbora Presto, MD  [facility] Pager [phone]  If 7PM-7AM, please contact night-coverage [email or web address] Password [email or web address] 10/08/2011, 3:50 PM   LOS: 1 day

## 2011-10-08 NOTE — Progress Notes (Signed)
   CARE MANAGEMENT NOTE 10/08/2011  Patient:  Patrick Murray, Patrick Murray   Account Number:  [id number]  Date Initiated:  10/08/2011  Documentation initiated by:  Jiles Crocker  Subjective/Objective Assessment:   ADMITTED WITH GIB     Action/Plan:   PCP: Kimber Relic, MD  GI: Vida Rigger, MD  PATIENT RESIDES IN AN INDEPENDENT LIVING FACILITY   Anticipated DC Date:  10/15/2011   Anticipated DC Plan:  HOME/SELF CARE      DC Planning Services  CM consult       Status of service:  In process, will continue to follow Medicare Important Message given?  NA - LOS <3 / Initial given by admissions (If response is "NO", the following Medicare IM given date fields will be blank)  Per UR Regulation:  Reviewed for med. necessity/level of care/duration of stay  Comments:  10/08/2011- B Lavone Barrientes RN, BSN, MHA

## 2011-10-09 ENCOUNTER — Encounter (HOSPITAL_COMMUNITY): Payer: Self-pay

## 2011-10-09 ENCOUNTER — Encounter (HOSPITAL_COMMUNITY): Payer: Self-pay | Admitting: Gastroenterology

## 2011-10-09 LAB — TYPE AND SCREEN
ABO/RH(D): A POS
Antibody Screen: NEGATIVE
Unit division: 0
Unit division: 0

## 2011-10-09 LAB — CBC
MCH: 31.3 pg (ref 26.0–34.0)
MCHC: 35.2 g/dL (ref 30.0–36.0)
Platelets: 112 10*3/uL — ABNORMAL LOW (ref 150–400)
RDW: 13.7 % (ref 11.5–15.5)

## 2011-10-09 LAB — BASIC METABOLIC PANEL
Calcium: 8.4 mg/dL (ref 8.4–10.5)
GFR calc non Af Amer: 26 mL/min — ABNORMAL LOW (ref >90)
Sodium: 141 mEq/L (ref 135–145)

## 2011-10-09 LAB — GLUCOSE, CAPILLARY
Glucose-Capillary: 127 mg/dL — ABNORMAL HIGH (ref 70–99)
Glucose-Capillary: 131 mg/dL — ABNORMAL HIGH (ref 70–99)

## 2011-10-09 LAB — HEMOGLOBIN A1C: Mean Plasma Glucose: 134 mg/dL — ABNORMAL HIGH (ref <117)

## 2011-10-09 MED ORDER — PANTOPRAZOLE SODIUM 40 MG IV SOLR
40.0000 mg | Freq: Two times a day (BID) | INTRAVENOUS | Status: DC
  Administered 2011-10-09 – 2011-10-12 (×7): 40 mg via INTRAVENOUS
  Filled 2011-10-09 (×8): qty 40

## 2011-10-09 MED FILL — Insulin Aspart Inj 100 Unit/ML: SUBCUTANEOUS | Qty: 0.01 | Status: AC

## 2011-10-09 NOTE — Evaluation (Signed)
Physical Therapy Evaluation Patient Details Name: Patrick Murray MRN: [id number] DOB: [birth year] Today's Date: 10/09/2011 Time: 1209-1228 PT Time Calculation (min): 19 min  PT Assessment / Plan / Recommendation Clinical Impression  Pt presents with GIB s/p EGD with history of afib s/p pacemaker implant and dementia.  Per notes, pt was living in an independent living facility.  No family present at time of eval to assess cognitive baseline, however was confused throughout session and repeated many things over.  Tolerated OOB and ambulation in hallway, however requires min assist at all times.  Pt will benefit from skilled PT in acute venue to address deficits.  PT recommends perhaps assisted living (memory unit) if they are available to provide 24/7 supervision/assist and at least min assist.  If not, pt will require SNF for follow up at D/C to increase safety.     PT Assessment  Patient needs continued PT services    Follow Up Recommendations  Home health PT;Post acute inpatient rehab (HHPT if D/C to ALF. )    Barriers to Discharge Decreased caregiver support      Equipment Recommendations  Rolling walker with 5" wheels    Recommendations for Other Services     Frequency Min 3X/week    Precautions / Restrictions Precautions Precautions: Fall Restrictions Weight Bearing Restrictions: No   Pertinent Vitals/Pain No pain      Mobility  Bed Mobility Bed Mobility: Supine to Sit;Sitting - Scoot to Edge of Bed Supine to Sit: 4: Min guard;HOB elevated Sitting - Scoot to [place] of Bed: 4: Min guard Details for Bed Mobility Assistance: Min/guard for safety of trunk when sitting.  Cues for safety and technique, however pt unable to fully follow commands due to decreased cognition.  Transfers Transfers: Sit to Stand;Stand to Sit Sit to Stand: 4: Min assist;From elevated surface;With upper extremity assist;From bed Stand to Sit: 4: Min assist;With upper extremity assist;With  armrests;To chair/3-in-1 Details for Transfer Assistance: Assist to rise and steady with cues for hand placement and safety when sitting/standing, however pt unable to fully follow commands due to decreased cognition.  Ambulation/Gait Ambulation/Gait Assistance: 4: Min assist Ambulation Distance (Feet): 100 Feet Assistive device: Rolling walker Ambulation/Gait Assistance Details: Assist to steady and steer RW with cues for safety, sequencing/technique and upright posture.   Gait Pattern: Step-through pattern;Decreased stride length;Trunk flexed;Narrow base of support Gait velocity: decreased Stairs: No Wheelchair Mobility Wheelchair Mobility: No    Shoulder Instructions     Exercises     PT Diagnosis: Difficulty walking;Generalized weakness  PT Problem List: Decreased strength;Decreased activity tolerance;Decreased balance;Decreased mobility;Decreased cognition;Decreased knowledge of use of DME;Decreased safety awareness PT Treatment Interventions: DME instruction;Gait training;Functional mobility training;Therapeutic activities;Therapeutic exercise;Balance training;Patient/family education   PT Goals Acute Rehab PT Goals PT Goal Formulation: With patient Time For Goal Achievement: 10/23/11 Potential to Achieve Goals: Fair Pt will go Sit to Supine/Side: with supervision PT Goal: Sit to Supine/Side - Progress: Goal set today Pt will go Sit to Stand: with supervision PT Goal: Sit to Stand - Progress: Goal set today Pt will go Stand to Sit: with supervision PT Goal: Stand to Sit - Progress: Goal set today Pt will Ambulate: 51 - 150 feet;with supervision;with least restrictive assistive device PT Goal: Ambulate - Progress: Goal set today  Visit Information  Last PT Received On: 10/09/11 Assistance Needed: +2 (for safety)    Subjective Data  Subjective: I hope no one steals my lunch Patient Stated Goal: n/a   Prior Functioning  Home Living Lives  With: Alone (per notes  independent living) Additional Comments: Pt states that he was doing all ADL's on his own, except for meal prep and that he was not ambulating with cane or RW, however pt with decreased cognition and confused during session.  Prior Function Comments: see above.  Communication Communication: No difficulties    Cognition  Overall Cognitive Status: No family/caregiver present to determine baseline cognitive functioning Arousal/Alertness: Awake/alert Orientation Level: Disoriented to;Place;Time;Situation Behavior During Session: [facility] for tasks performed    Extremity/Trunk Assessment Right Lower Extremity Assessment RLE ROM/Strength/Tone: Unable to fully assess;Due to impaired cognition;Deficits RLE ROM/Strength/Tone Deficits: Generalized weakness, grossly 3/5 per functional assessment.  Left Lower Extremity Assessment LLE ROM/Strength/Tone: Deficits;Unable to fully assess;Due to impaired cognition LLE ROM/Strength/Tone Deficits: Generalized weakness, grossly 3/5 per functional assessment.  Trunk Assessment Trunk Assessment: Kyphotic   Balance    End of Session    GP     PageMeribeth Murray 10/09/2011, 1:03 PM

## 2011-10-09 NOTE — Progress Notes (Signed)
CSW spoke with patient re: discharge planning. Patient was admitted from: [facility], refusing SNF or ALF at discharge. Note PT recommending ALF vs. SNF at discharge, patient states he will not be "put in a rabbit cage" and states that he will be returning to his apartment in Independent Living. CSW left message for [place] [facility]. CSW to follow-up in the morning.   Unice Bailey, LCSW [facility] Clinical Social Worker cell #: [phone]

## 2011-10-09 NOTE — Progress Notes (Signed)
Patient ID: Patrick Murray, male   DOB: [birth year], 78 y.o.   MRN: [id number]  [facility] PROGRESS NOTE  Patrick Murray WUJ:[id number] DOB: [birth year] DOA: 10/07/2011 PCP: Kimber Relic, MD  Brief narrative:  Patient is 78 year old male, resident of independent living facility who presented to emergency department with main concern of tarry black stool. Patient reported one episode earlier the morning of admission, but has had several episodes of similar problem over the past 2 weeks prior to admission. Patient reported history of similar events and endoscopy indicated gastric ulcer as being the source of this bleed. Patient admitted 10/07/2011 for further evaluation and management.   Principal Problem:  *GI bleed  - per EGD likely due to peptic ulcer - please see recommendations bellow - Appreciate GI input  - Hg and Hct stable this AM with appropriate rise after transfusion  - CBC in morning   EGD 10/08/2011  ENDOSCOPIC IMPRESSION:  Anemia, melena, syncope, all likely ultimately due antral ulcer.  RECOMMENDATIONS:  1. Watch for potential complications of procedure.  2. Check H. pylori serologies.  3. Clear liquid diet today, advance tomorrow if no further bleeding.  4. Protonix IV Q 12 hours today, transition to oral therapy likely tomorrow.  5. Avoid ASA/NSAIDs as clinically feasible.  6. [facility] inpatient team to follow.   Active Problems:  DM  - Stable  - Continue sliding scale insulin  HYPERTENSION  - stable  - continue to monitor vitals per floor protocol  PAROXYSMAL ATRIAL FIBRILLATION  - in sinus rhythm this AM  - HR well controlled  RENAL INSUFFICIENCY, CHRONIC  - slightly worsening creatinine since yesterday  - likely of pre renal etiology imposed on chronic renal failure  - BMP in AM  Anemia  - please see above  - CBC in AM   Consultants:  Dr. Dulce Sellar - GI  Procedures/Studies:  Ct Head Wo Contrast  10/07/2011  IMPRESSION:  Chronic  changes of atrophy and small vessel disease consistent with the history of dementia. Right frontal scalp hematoma without skull fracture or intracranial hemorrhage. Chronic sinus changes.   Antibiotics:  None  Code Status: Full  Family Communication: Pt at bedside, sone at bedisde  Disposition Plan: Likely d/c in AM   HPI/Subjective: No events overnight.   Objective: Filed Vitals:   10/08/11 1702 10/08/11 1719 10/08/11 2143 10/09/11 0547  BP:  152/68 147/69 124/63  Pulse:  75 83 67  Temp:  98.3 F (36.8 C) 97.9 F (36.6 C) 97.5 F (36.4 C)  TempSrc:  Oral Oral Oral  Resp: 18 18 19 18   Height:      Weight:      SpO2: 100%  100% 100%    Intake/Output Summary (Last 24 hours) at 10/09/11 1040 Last data filed at 10/09/11 0842  Gross per 24 hour  Intake   4245 ml  Output   1575 ml  Net   2670 ml    Exam:   General:  Pt is alert, follows commands appropriately, not in acute distress  Cardiovascular: Regular rate and rhythm, S1/S2, no murmurs, no rubs, no gallops  Respiratory: Clear to auscultation bilaterally, no wheezing, no crackles, no rhonchi  Abdomen: Soft, non tender, non distended, bowel sounds present, no guarding  Extremities: No edema, pulses DP and PT palpable bilaterally  Neuro: Grossly nonfocal  Data Reviewed: Basic Metabolic Panel:  Lab 10/09/11 9562 10/08/11 0356 10/07/11 1050  NA 141 141 139  K 4.1 3.8 4.9  CL 112  112 105  CO2 20 19 17*  GLUCOSE 124* 139* 206*  BUN 76* 98* 99*  CREATININE 2.07* 2.15* 1.97*  CALCIUM 8.4 8.3* 8.8  MG -- -- --  PHOS -- -- --   CBC:  Lab 10/09/11 0359 10/08/11 0530 10/07/11 1050  WBC 6.9 7.2 13.3*  NEUTROABS -- -- --  HGB 8.2* 6.5* 8.9*  HCT 23.3* 18.8* 26.3*  MCV 88.9 89.2 89.8  PLT 112* 134* 116*   Cardiac Enzymes:  Lab 10/08/11 0945 10/08/11 0356 10/07/11 2204 10/07/11 1050  CKTOTAL -- 77 -- 82  CKMB -- -- -- --  CKMBINDEX -- -- -- --  TROPONINI <0.30 <0.30 <0.30 --   BNP: No components  found with this basename: POCBNP:5 CBG:  Lab 10/09/11 0745 10/08/11 1726 10/08/11 1458 10/08/11 1159 10/08/11 0759  GLUCAP 127* 119* 109* 118* 135*    Recent Results (from the past 240 hour(s))  MRSA PCR SCREENING     Status: Normal   Collection Time   10/07/11 11:29 PM      Component Value Range Status Comment   MRSA by PCR NEGATIVE  NEGATIVE Final      Scheduled Meds:   . amLODipine  5 mg Oral Daily  . calcitRIOL  0.25 mcg Oral Daily  . galantamine  16 mg Oral Daily  . hydrochlorothiazide  25 mg Oral Daily  . insulin aspart  0-9 Units Subcutaneous BID WC  . pantoprazole (PROTONIX) IV  40 mg Intravenous Q12H  . sertraline  50 mg Oral Daily  . sodium chloride  3 mL Intravenous Q12H  . DISCONTD: insulin aspart  0-9 Units Subcutaneous TID WC   Continuous Infusions:   . sodium chloride 75 mL/hr at 10/08/11 1747  . DISCONTD: pantoprozole (PROTONIX) infusion Stopped (10/09/11 1610)     Debbora Presto, MD  [facility] Pager [phone]  If 7PM-7AM, please contact night-coverage [email or web address] Password [email or web address] 10/09/2011, 10:40 AM   LOS: 2 days

## 2011-10-09 NOTE — Progress Notes (Signed)
Subjective: No reported bleeding. Is hungry.  Objective: Vital signs in last 24 hours: Temp:  [97.5 F (36.4 C)-98.6 F (37 C)] 97.5 F (36.4 C) (10/03 0547) Pulse Rate:  [67-98] 67  (10/03 0547) Resp:  [14-29] 18  (10/03 0547) BP: (106-171)/(51-96) 124/63 mmHg (10/03 0547) SpO2:  [98 %-100 %] 100 % (10/03 0547) Weight change:  Last BM Date: 10/07/11  PE: GEN:  NAD, multiple facial bruises from recent fall ABD:  Soft  Lab Results: CBC    Component Value Date/Time   WBC 6.9 10/09/2011 0359   RBC 2.62* 10/09/2011 0359   HGB 8.2* 10/09/2011 0359   HCT 23.3* 10/09/2011 0359   PLT 112* 10/09/2011 0359   MCV 88.9 10/09/2011 0359   MCH 31.3 10/09/2011 0359   MCHC 35.2 10/09/2011 0359   RDW 13.7 10/09/2011 0359   LYMPHSABS 1.1 09/22/2009 0438   MONOABS 0.8 09/22/2009 0438   EOSABS 0.0 09/22/2009 0438   BASOSABS 0.0 09/22/2009 0438   Assessment:  1.  Melena due to gastric ulcer.  No reported bleeding overnight. 2.  Anemia, acute blood loss. 3.  Syncope, due to #1 above.  Plan:  1.  Protonix 40 mg iv q 12 hrs today, transition to orals likely tomorrow. 2.  Soft mechanical diet, advance as tolerated. 3.  Awaiting H. Pylori serologies. 4.  Avoid/Minimize NSAIDs. 5.  Hopefully home in the next couple days.   Patrick Murray 10/09/2011, 7:51 AM

## 2011-10-10 LAB — BASIC METABOLIC PANEL
BUN: 54 mg/dL — ABNORMAL HIGH (ref 6–23)
CO2: 21 mEq/L (ref 19–32)
Calcium: 8.6 mg/dL (ref 8.4–10.5)
Creatinine, Ser: 2.08 mg/dL — ABNORMAL HIGH (ref 0.50–1.35)
GFR calc Af Amer: 30 mL/min — ABNORMAL LOW (ref >90)

## 2011-10-10 LAB — CBC
HCT: 24.4 % — ABNORMAL LOW (ref 39.0–52.0)
MCH: 31.4 pg (ref 26.0–34.0)
MCV: 89.1 fL (ref 78.0–100.0)
Platelets: 123 10*3/uL — ABNORMAL LOW (ref 150–400)
RDW: 13.5 % (ref 11.5–15.5)

## 2011-10-10 LAB — GLUCOSE, CAPILLARY
Glucose-Capillary: 187 mg/dL — ABNORMAL HIGH (ref 70–99)
Glucose-Capillary: 127 mg/dL — ABNORMAL HIGH (ref 70–99)

## 2011-10-10 LAB — HELICOBACTER PYLORI ABS-IGG+IGA, BLD: H Pylori IgA: 4.9 U/mL (ref <9.0)

## 2011-10-10 MED ORDER — AMOXICILLIN 500 MG PO CAPS
500.0000 mg | ORAL_CAPSULE | Freq: Two times a day (BID) | ORAL | Status: DC
  Administered 2011-10-10 – 2011-10-12 (×5): 500 mg via ORAL
  Filled 2011-10-10 (×6): qty 1

## 2011-10-10 MED ORDER — CLARITHROMYCIN 250 MG PO TABS
250.0000 mg | ORAL_TABLET | Freq: Two times a day (BID) | ORAL | Status: DC
  Administered 2011-10-10 – 2011-10-12 (×5): 250 mg via ORAL
  Filled 2011-10-10 (×6): qty 1

## 2011-10-10 MED FILL — Insulin Aspart Inj 100 Unit/ML: SUBCUTANEOUS | Qty: 0.01 | Status: AC

## 2011-10-10 NOTE — Progress Notes (Signed)
Subjective: Supposedly had melena yesterday ("nurses told me that").  Objective: Vital signs in last 24 hours: Temp:  [97.6 F (36.4 C)-98.5 F (36.9 C)] 98.5 F (36.9 C) (10/04 0604) Pulse Rate:  [72-85] 84  (10/04 0604) Resp:  [18-20] 20  (10/04 0604) BP: (114-138)/(56-65) 134/65 mmHg (10/04 0604) SpO2:  [97 %-100 %] 97 % (10/04 0604) Weight change:  Last BM Date: 10/08/11  PE: GEN:  More alert, less pale ABD:  Soft  Lab Results: CBC    Component Value Date/Time   WBC 7.1 10/10/2011 0455   RBC 2.74* 10/10/2011 0455   HGB 8.6* 10/10/2011 0455   HCT 24.4* 10/10/2011 0455   PLT 123* 10/10/2011 0455   MCV 89.1 10/10/2011 0455   MCH 31.4 10/10/2011 0455   MCHC 35.2 10/10/2011 0455   RDW 13.5 10/10/2011 0455   LYMPHSABS 1.1 09/22/2009 0438   MONOABS 0.8 09/22/2009 0438   EOSABS 0.0 09/22/2009 0438   BASOSABS 0.0 09/22/2009 0438   CMP     Component Value Date/Time   NA 141 10/10/2011 0455   K 3.8 10/10/2011 0455   CL 111 10/10/2011 0455   CO2 21 10/10/2011 0455   GLUCOSE 135* 10/10/2011 0455   BUN 54* 10/10/2011 0455   CREATININE 2.08* 10/10/2011 0455   CALCIUM 8.6 10/10/2011 0455   PROT 6.6 09/21/2009 1755   ALBUMIN 3.0* 09/23/2009 0536   AST 18 09/21/2009 1755   ALT 12 09/21/2009 1755   ALKPHOS 54 09/21/2009 1755   BILITOT 0.5 09/21/2009 1755   GFRNONAA 26* 10/10/2011 0455   GFRAA 30* 10/10/2011 0455   Assessment:  1.  Melena, with acute blood loss anemia. 2.  Gastric ulcer, likely cause of #1 above. 3.  H. Pylori seropositive.  I suspect this is more likely to have caused his recurrent gastric ulcer than his ASA 81 mg/day.  Plan:  1.  Protonix 40 mg bo bid x 12 weeks. 2.  Treat H. Pylori infection (Amoxicillin 500 mg bo bid + Biaxin 250 mg bo bid) for 14 day course.  I have discussed with pharmacy Amalia Hailey) to ensure appropriate renal-dosing of his antibiotics. 3.  Will Follow.   Danely Bayliss M 10/10/2011, 10:20 AM

## 2011-10-10 NOTE — Progress Notes (Signed)
Patient ID: Patrick Murray, male   DOB: 1917-03-26, 78 y.o.   MRN: [id number]  [facility] PROGRESS NOTE  Patrick Murray ION:[id number] DOB: 12/10/1917 DOA: 10/07/2011 PCP: Kimber Relic, MD  Brief narrative:  Patient is 78 year old male, resident of independent living facility who presented to emergency department with main concern of tarry black stool. Patient reported one episode earlier the morning of admission, but has had several episodes of similar problem over the past 2 weeks prior to admission. Patient reported history of similar events and endoscopy indicated gastric ulcer as being the source of this bleed. Patient admitted 10/07/2011 for further evaluation and management.   Principal Problem:  *GI bleed  - per EGD likely due to peptic ulcer  - please see recommendations bellow  - Appreciate GI input  - Hg and Hct stable this AM with appropriate rise after transfusion  - CBC in morning   EGD 10/08/2011  ENDOSCOPIC IMPRESSION:  Anemia, melena, syncope, all likely ultimately due antral ulcer.  RECOMMENDATIONS:  1. Watch for potential complications of procedure.  2. Check H. pylori serologies.  3. Clear liquid diet today, advance tomorrow if no further bleeding.  4. Protonix IV Q 12 hours today, transition to oral therapy likely tomorrow.  5. Avoid ASA/NSAIDs as clinically feasible.  6. [facility] inpatient team to follow.   Active Problems:  DM  - Stable  - Continue sliding scale insulin  HYPERTENSION  - stable  - continue to monitor vitals per floor protocol  PAROXYSMAL ATRIAL FIBRILLATION  - in sinus rhythm this AM  - HR well controlled  RENAL INSUFFICIENCY, CHRONIC  - creatinine remains stable and likely pt's new baseline  - likely of pre renal etiology imposed on chronic renal failure  - BMP in AM  Anemia  - please see above  - CBC in AM   Consultants:  Dr. Dulce Sellar - GI  Procedures/Studies:  Ct Head Wo Contrast  10/07/2011  IMPRESSION:    Chronic changes of atrophy and small vessel disease consistent with the history of dementia. Right frontal scalp hematoma without skull fracture or intracranial hemorrhage. Chronic sinus changes.   Antibiotics:  None  Code Status: Full  Family Communication: Pt at bedside, sone at bedisde  Disposition Plan: Likely d/c in AM    HPI/Subjective: No events overnight.   Objective: Filed Vitals:   10/09/11 1449 10/09/11 2155 10/10/11 0604 10/10/11 1414  BP: 114/56 138/58 134/65 140/67  Pulse: 85 72 84   Temp: 97.6 F (36.4 C) 97.6 F (36.4 C) 98.5 F (36.9 C) 98 F (36.7 C)  TempSrc: Oral Oral Oral Oral  Resp: 20 18 20 20   Height:      Weight:      SpO2: 100% 100% 97% 99%    Intake/Output Summary (Last 24 hours) at 10/10/11 1851 Last data filed at 10/10/11 1700  Gross per 24 hour  Intake    720 ml  Output   1400 ml  Net   -680 ml    Exam:   General:  Pt is alert, follows commands appropriately, not in acute distress  Cardiovascular: Regular rate and rhythm, S1/S2, no murmurs, no rubs, no gallops  Respiratory: Clear to auscultation bilaterally, no wheezing, no crackles, no rhonchi  Abdomen: Soft, non tender, non distended, bowel sounds present, no guarding  Extremities: No edema, pulses DP and PT palpable bilaterally  Neuro: Grossly nonfocal  Data Reviewed: Basic Metabolic Panel:  Lab 10/10/11 2440 10/09/11 0359 10/08/11 0356 10/07/11 1050  NA 141 141 141 139  K 3.8 4.1 3.8 4.9  CL 111 112 112 105  CO2 21 20 19  17*  GLUCOSE 135* 124* 139* 206*  BUN 54* 76* 98* 99*  CREATININE 2.08* 2.07* 2.15* 1.97*  CALCIUM 8.6 8.4 8.3* 8.8  MG -- -- -- --  PHOS -- -- -- --   Liver Function Tests: No results found for this basename: AST:5,ALT:5,ALKPHOS:5,BILITOT:5,PROT:5,ALBUMIN:5 in the last 168 hours No results found for this basename: LIPASE:5,AMYLASE:5 in the last 168 hours No results found for this basename: AMMONIA:5 in the last 168 hours CBC:  Lab 10/10/11  0455 10/09/11 0359 10/08/11 0530 10/07/11 1050  WBC 7.1 6.9 7.2 13.3*  NEUTROABS -- -- -- --  HGB 8.6* 8.2* 6.5* 8.9*  HCT 24.4* 23.3* 18.8* 26.3*  MCV 89.1 88.9 89.2 89.8  PLT 123* 112* 134* 116*   Cardiac Enzymes:  Lab 10/08/11 0945 10/08/11 0356 10/07/11 2204 10/07/11 1050  CKTOTAL -- 77 -- 82  CKMB -- -- -- --  CKMBINDEX -- -- -- --  TROPONINI <0.30 <0.30 <0.30 --   BNP: No components found with this basename: POCBNP:5 CBG:  Lab 10/10/11 1653 10/10/11 1225 10/10/11 0801 10/09/11 2139 10/09/11 1653  GLUCAP 187* 150* 139* 131* 137*    Recent Results (from the past 240 hour(s))  MRSA PCR SCREENING     Status: Normal   Collection Time   10/07/11 11:29 PM      Component Value Range Status Comment   MRSA by PCR NEGATIVE  NEGATIVE Final      Scheduled Meds:   . amLODipine  5 mg Oral Daily  . amoxicillin  500 mg Oral Q12H  . calcitRIOL  0.25 mcg Oral Daily  . clarithromycin  250 mg Oral Q12H  . galantamine  16 mg Oral Daily  . hydrochlorothiazide  25 mg Oral Daily  . insulin aspart  0-9 Units Subcutaneous BID WC  . pantoprazole (PROTONIX) IV  40 mg Intravenous Q12H  . sertraline  50 mg Oral Daily  . sodium chloride  3 mL Intravenous Q12H   Continuous Infusions:    Debbora Presto, MD  [facility] Pager [phone]  If 7PM-7AM, please contact night-coverage [email or web address] Password [email or web address] 10/10/2011, 6:51 PM   LOS: 3 days

## 2011-10-10 NOTE — Progress Notes (Signed)
CSW spoke with [place] @ [facility] to confirm that patient is ok to return to his Independent Living apartment with [facility] home health following. [facility] apartment RN, Jaymes Graff (ph#: [phone] ext. [phone]) requesting RN @ [facility] call for update, Delorise Shiner, RN made aware.   Unice Bailey, LCSW [facility] Clinical Social Worker cell #: [phone]

## 2011-10-11 LAB — CBC
Hemoglobin: 9.2 g/dL — ABNORMAL LOW (ref 13.0–17.0)
MCH: 31 pg (ref 26.0–34.0)
MCHC: 34.8 g/dL (ref 30.0–36.0)
Platelets: 144 10*3/uL — ABNORMAL LOW (ref 150–400)

## 2011-10-11 LAB — GLUCOSE, CAPILLARY
Glucose-Capillary: 167 mg/dL — ABNORMAL HIGH (ref 70–99)
Glucose-Capillary: 146 mg/dL — ABNORMAL HIGH (ref 70–99)

## 2011-10-11 LAB — BASIC METABOLIC PANEL
Calcium: 8.6 mg/dL (ref 8.4–10.5)
GFR calc Af Amer: 30 mL/min — ABNORMAL LOW (ref >90)
GFR calc non Af Amer: 26 mL/min — ABNORMAL LOW (ref >90)
Glucose, Bld: 131 mg/dL — ABNORMAL HIGH (ref 70–99)
Potassium: 3.6 mEq/L (ref 3.5–5.1)
Sodium: 137 mEq/L (ref 135–145)

## 2011-10-11 NOTE — Progress Notes (Signed)
Patient ID: Patrick Murray, male   DOB: 1917/01/17, 78 y.o.   MRN: [id number]  [facility] PROGRESS NOTE  JACQUELYN VILLELLA NWG:[id number] DOB: 1917-01-18 DOA: 10/21/2011 PCP: Kimber Relic, MD  Brief narrative:  Patient is 78 year old male, resident of independent living facility who presented to emergency department with main concern of tarry black stool. Patient reported one episode earlier the morning of admission, but has had several episodes of similar problem over the past 2 weeks prior to admission. Patient reported history of similar events and endoscopy indicated gastric ulcer as being the source of this bleed. Patient admitted 2011-10-21 for further evaluation and management.   Principal Problem:  *GI bleed  - per EGD likely due to peptic ulcer secondary to H. Pylori - please see recommendations bellow  - Appreciate GI input  - Hg and Hct stable this AM with appropriate rise after transfusion  - CBC in morning  EGD 10/08/2011  ENDOSCOPIC IMPRESSION:  Anemia, melena, syncope, all likely ultimately due antral ulcer.  RECOMMENDATIONS:  1. Watch for potential complications of procedure.  2. Check H. pylori serologies.  3. Clear liquid diet today, advance tomorrow if no further bleeding.  4. Protonix IV Q 12 hours today, transition to oral therapy likely tomorrow.  5. Avoid ASA/NSAIDs as clinically feasible.  6. [facility] inpatient team to follow.   Active Problems:  DM  - Stable  - Continue sliding scale insulin  HYPERTENSION  - stable  - continue to monitor vitals per floor protocol  PAROXYSMAL ATRIAL FIBRILLATION  - in sinus rhythm this AM  - HR well controlled  RENAL INSUFFICIENCY, CHRONIC  - creatinine remains stable and likely pt's new baseline  - likely of pre renal etiology imposed on chronic renal failure  - BMP in AM  Anemia  - please see above  - CBC in AM   Consultants:  Dr. Dulce Sellar - GI  Procedures/Studies:  Ct Head Wo Contrast    Oct 21, 2011  IMPRESSION:  Chronic changes of atrophy and small vessel disease consistent with the history of dementia. Right frontal scalp hematoma without skull fracture or intracranial hemorrhage. Chronic sinus changes.   Antibiotics:  Amoxicillin Clarithromycin   Code Status: Full  Family Communication: Pt at bedside, sone at bedisde  Disposition Plan: Likely d/c in AM    HPI/Subjective: No events overnight.   Objective: Filed Vitals:   10/10/11 1414 10/10/11 2159 10/11/11 0632 10/11/11 1414  BP: 140/67 143/73 140/79 154/57  Pulse:  85 78 95  Temp: 98 F (36.7 C) 98.1 F (36.7 C) 98.4 F (36.9 C) 94.7 F (34.8 C)  TempSrc: Oral Oral Oral Oral  Resp: 20 18 16 18   Height:      Weight:      SpO2: 99% 98% 98% 95%    Intake/Output Summary (Last 24 hours) at 10/11/11 1715 Last data filed at 10/11/11 1300  Gross per 24 hour  Intake    480 ml  Output   1025 ml  Net   -545 ml    Exam:   General:  Pt is alert, follows commands appropriately, not in acute distress  Cardiovascular: Regular rate and rhythm, S1/S2, no murmurs, no rubs, no gallops  Respiratory: Clear to auscultation bilaterally, no wheezing, no crackles, no rhonchi  Abdomen: Soft, non tender, non distended, bowel sounds present, no guarding  Extremities: No edema, pulses DP and PT palpable bilaterally  Neuro: Grossly nonfocal  Data Reviewed: Basic Metabolic Panel:  Lab 10/11/11 5784 10/10/11 0455  10/09/11 0359 10/08/11 0356 10/07/11 1050  NA 137 141 141 141 139  K 3.6 3.8 4.1 3.8 4.9  CL 105 111 112 112 105  CO2 22 21 20 19  17*  GLUCOSE 131* 135* 124* 139* 206*  BUN 39* 54* 76* 98* 99*  CREATININE 2.08* 2.08* 2.07* 2.15* 1.97*  CALCIUM 8.6 8.6 8.4 8.3* 8.8  MG -- -- -- -- --  PHOS -- -- -- -- --   Liver Function Tests: No results found for this basename: AST:5,ALT:5,ALKPHOS:5,BILITOT:5,PROT:5,ALBUMIN:5 in the last 168 hours No results found for this basename: LIPASE:5,AMYLASE:5 in the last  168 hours No results found for this basename: AMMONIA:5 in the last 168 hours CBC:  Lab 10/11/11 0500 10/10/11 0455 10/09/11 0359 10/08/11 0530 10/07/11 1050  WBC 7.2 7.1 6.9 7.2 13.3*  NEUTROABS -- -- -- -- --  HGB 9.2* 8.6* 8.2* 6.5* 8.9*  HCT 26.4* 24.4* 23.3* 18.8* 26.3*  MCV 88.9 89.1 88.9 89.2 89.8  PLT 144* 123* 112* 134* 116*   Cardiac Enzymes:  Lab 10/08/11 0945 10/08/11 0356 10/07/11 2204 10/07/11 1050  CKTOTAL -- 77 -- 82  CKMB -- -- -- --  CKMBINDEX -- -- -- --  TROPONINI <0.30 <0.30 <0.30 --   BNP: No components found with this basename: POCBNP:5 CBG:  Lab 10/11/11 1214 10/11/11 0801 10/10/11 2202 10/10/11 1653 10/10/11 1225  GLUCAP 167* 123* 127* 187* 150*    Recent Results (from the past 240 hour(s))  MRSA PCR SCREENING     Status: Normal   Collection Time   10/07/11 11:29 PM      Component Value Range Status Comment   MRSA by PCR NEGATIVE  NEGATIVE Final      Scheduled Meds:   . amLODipine  5 mg Oral Daily  . amoxicillin  500 mg Oral Q12H  . calcitRIOL  0.25 mcg Oral Daily  . clarithromycin  250 mg Oral Q12H  . galantamine  16 mg Oral Daily  . hydrochlorothiazide  25 mg Oral Daily  . insulin aspart  0-9 Units Subcutaneous BID WC  . pantoprazole (PROTONIX) IV  40 mg Intravenous Q12H  . sertraline  50 mg Oral Daily  . sodium chloride  3 mL Intravenous Q12H   Continuous Infusions:    Debbora Presto, MD  [facility] Pager [phone]  If 7PM-7AM, please contact night-coverage [email or web address] Password [email or web address] 10/11/2011, 5:15 PM   LOS: 4 days

## 2011-10-11 NOTE — Progress Notes (Signed)
Subjective: No further bleeding. No abdominal pain. Wants to go home.  Objective: Vital signs in last 24 hours: Temp:  [98 F (36.7 C)-98.4 F (36.9 C)] 98.4 F (36.9 C) (10/05 1610) Pulse Rate:  [78-85] 78  (10/05 0632) Resp:  [16-20] 16  (10/05 0632) BP: (140-143)/(67-79) 140/79 mmHg (10/05 0632) SpO2:  [98 %-99 %] 98 % (10/05 0632) Weight change:  Last BM Date: 10/09/11  PE: GEN:  NAD ABD:  Soft  Lab Results: CBC    Component Value Date/Time   WBC 7.2 10/11/2011 0500   RBC 2.97* 10/11/2011 0500   HGB 9.2* 10/11/2011 0500   HCT 26.4* 10/11/2011 0500   PLT 144* 10/11/2011 0500   MCV 88.9 10/11/2011 0500   MCH 31.0 10/11/2011 0500   MCHC 34.8 10/11/2011 0500   RDW 13.1 10/11/2011 0500   LYMPHSABS 1.1 09/22/2009 0438   MONOABS 0.8 09/22/2009 0438   EOSABS 0.0 09/22/2009 0438   BASOSABS 0.0 09/22/2009 0438   Assessment:  1.  Melena with gastric ulcer.  No further bleeding.  Likely H pylori-related.  ASA 81/day not excluded as cause but seems less likely. 2.  Anemia, acute blood loss.  Hemoglobin stable.  Plan:  1.  Protonix 40 mg po bid x 12 weeks, then would decrease to 40 mg po once-a-day and continue this indefinitely (given his recurrent bleeding ulcers). 2.  Antibiotic therapy for H. Pylori (clarithromycin 250 mg bid and amoxicillin 500 mg bid, to complete 14 day total course of treatment). 3.  Will sign-off; please call with questions.  Thank you for the consult.  Freddy Jaksch 10/11/2011, 10:08 AM

## 2011-10-12 LAB — GLUCOSE, CAPILLARY: Glucose-Capillary: 135 mg/dL — ABNORMAL HIGH (ref 70–99)

## 2011-10-12 LAB — BASIC METABOLIC PANEL
CO2: 23 mEq/L (ref 19–32)
Chloride: 103 mEq/L (ref 96–112)
Creatinine, Ser: 2.03 mg/dL — ABNORMAL HIGH (ref 0.50–1.35)
GFR calc Af Amer: 31 mL/min — ABNORMAL LOW (ref >90)
Potassium: 3.6 mEq/L (ref 3.5–5.1)
Sodium: 137 mEq/L (ref 135–145)

## 2011-10-12 LAB — CBC
MCV: 88.3 fL (ref 78.0–100.0)
Platelets: 142 10*3/uL — ABNORMAL LOW (ref 150–400)
RBC: 2.82 MIL/uL — ABNORMAL LOW (ref 4.22–5.81)
RDW: 13.1 % (ref 11.5–15.5)
WBC: 9.3 10*3/uL (ref 4.0–10.5)

## 2011-10-12 MED ORDER — AMOXICILLIN 500 MG PO CAPS: 500.0000 mg | ORAL_CAPSULE | Freq: Two times a day (BID) | ORAL | Status: DC

## 2011-10-12 MED ORDER — CLARITHROMYCIN 250 MG PO TABS: 250.0000 mg | ORAL_TABLET | Freq: Two times a day (BID) | ORAL | Status: DC

## 2011-10-12 MED ORDER — PANTOPRAZOLE SODIUM 40 MG PO TBEC: 40.0000 mg | DELAYED_RELEASE_TABLET | Freq: Two times a day (BID) | ORAL | Status: DC

## 2011-10-12 NOTE — Progress Notes (Signed)
CSW spoke with [facility] about Pt's return to apartment. Facility needed reassurance  Asher Muir) that Pt would be ok in his apartment. CSW had Pt nurse contact this individual at the facility to confirm Pt would not need assistance in the ALF.   CSW spoke with son Cleone Slim concerning Pt discharge and concerns of facility. Son stated that he will make sure Pt is stable prior to leaving him in his apartment and if he was not walking steady then he would take his father to [place] until other arrangements can be made with the facility.   No further CSW needs.   Leron Croak, LCSWA [employer] Coverage [phone]

## 2011-10-12 NOTE — Discharge Summary (Signed)
Physician Discharge Summary  Patrick Murray ZOX:[id number] DOB: 1917-03-26 DOA: 10/07/2011  PCP: Kimber Relic, MD  Admit date: 10/07/2011 Discharge date: 10/12/2011  Recommendations for Outpatient Follow-up:  1. Pt will need to follow up with PCP in 2-3 weeks post discharge 2. Please obtain BMP to evaluate electrolytes and kidney function 3. Please also check CBC to evaluate Hg and Hct levels 4. Pt will also need follow up with Dr. Dulce Sellar with GI in 2-3 weeks post discharge 5. Please note that pt was sent to assisted living facility on 2 antibiotics listed below which he will need to complete for 12 more days 6. Prescriptions given  Discharge Diagnoses: GI bleed secondary to peptic ulcer due to H. Pylori Principal Problem:  *GI bleed Active Problems:  DM  HYPERLIPIDEMIA  HYPERTENSION  PAROXYSMAL ATRIAL FIBRILLATION  SICK SINUS SYNDROME  RENAL INSUFFICIENCY, CHRONIC  Cardiac pacemaker in situ  History of gastric ulcer  Melena  Heme positive stool  Fall  Gait instability  Leukocytosis  Anemia  Dehydration  Elevated BUN   Discharge Condition: Stable  Diet recommendation: Heart healthy diet discussed in details   Brief narrative:  Patient is 78 year old male, resident of independent living facility who presented to emergency department with main concern of tarry black stool. Patient reported one episode earlier the morning of admission, but has had several episodes of similar problem over the past 2 weeks prior to admission. Patient reported history of similar events and endoscopy indicated gastric ulcer as being the source of this bleed. Patient admitted 10/07/2011 for further evaluation and management.   Principal Problem:  *GI bleed  - per EGD likely due to peptic ulcer secondary to H. Pylori  - please see recommendations bellow  - Appreciate GI input  - Hg and Hct stable this AM with appropriate rise after transfusion   EGD 10/08/2011  ENDOSCOPIC IMPRESSION:   Anemia, melena, syncope, all likely ultimately due antral ulcer.  RECOMMENDATIONS:  1. Watch for potential complications of procedure.  2. Check H. pylori serologies.  3. Clear liquid diet today, advance tomorrow if no further bleeding.  4. Protonix IV Q 12 hours today, transition to oral therapy likely tomorrow.  5. Avoid ASA/NSAIDs as clinically feasible.  6. [facility] inpatient team to follow.   Active Problems:  DM  - Stable  - can continue home regimen HYPERTENSION  - stable  - continue to monitor vitals weekly PAROXYSMAL ATRIAL FIBRILLATION  - in sinus rhythm this AM  - HR well controlled  RENAL INSUFFICIENCY, CHRONIC  - creatinine remains stable and likely pt's new baseline  - likely of pre renal etiology imposed on chronic renal failure  Anemia  - please see above   Consultants:  Dr. Dulce Sellar - GI  Procedures/Studies:  Ct Head Wo Contrast  10/07/2011  IMPRESSION:  Chronic changes of atrophy and small vessel disease consistent with the history of dementia. Right frontal scalp hematoma without skull fracture or intracranial hemorrhage. Chronic sinus changes.   Antibiotics:  Amoxicillin  Clarithromycin   Code Status: Full  Family Communication: Pt at bedside, son over the phone Disposition Plan: D/C to assisted living facility   Discharge Exam: Filed Vitals:   10/12/11 0500  BP: 139/81  Pulse: 79  Temp: 98.2 F (36.8 C)  Resp: 16   Filed Vitals:   10/11/11 0632 10/11/11 1414 10/11/11 2213 10/12/11 0500  BP: 140/79 154/57 141/96 139/81  Pulse: 78 95 88 79  Temp: 98.4 F (36.9 C) 94.7 F (34.8  C) 98.2 F (36.8 C) 98.2 F (36.8 C)  TempSrc: Oral Oral Oral Oral  Resp: 16 18 20 16   Height:      Weight:      SpO2: 98% 95% 100% 100%    General: Pt is alert, follows commands appropriately, not in acute distress Cardiovascular: Regular rate and rhythm, S1/S2 +, no murmurs, no rubs, no gallops Respiratory: Clear to auscultation bilaterally, no wheezing,  no crackles, no rhonchi Abdominal: Soft, non tender, non distended, bowel sounds +, no guarding Extremities: no edema, no cyanosis, pulses palpable bilaterally DP and PT Neuro: Grossly nonfocal  Discharge Instructions  Discharge Orders    Future Orders Please Complete By Expires   Diet - low sodium heart healthy      Increase activity slowly          Medication List     As of 10/12/2011  8:48 AM    TAKE these medications         amLODipine 5 MG tablet   Commonly known as: NORVASC   Take 5 mg by mouth daily.      amoxicillin 500 MG capsule   Commonly known as: AMOXIL   Take 1 capsule (500 mg total) by mouth every 12 (twelve) hours.      ASPIR-81 81 MG EC tablet   Generic drug: aspirin   Take 81 mg by mouth daily.      calcitRIOL 0.25 MCG capsule   Commonly known as: ROCALTROL   Take 0.25 mcg by mouth. 2 tabs even days 1 tab odd days      clarithromycin 250 MG tablet   Commonly known as: BIAXIN   Take 1 tablet (250 mg total) by mouth every 12 (twelve) hours.      GALANTAMINE HYDROBROMIDE PO   Take 16 mg by mouth daily. Take 2 tabs      glipiZIDE 5 MG tablet   Commonly known as: GLUCOTROL   Take 2.5 mg by mouth daily.      Glucosamine Sulfate Powd   by Does not apply route. 1500mg  once daily      hydrochlorothiazide 25 MG tablet   Commonly known as: HYDRODIURIL   Take 25 mg by mouth daily.      losartan 25 MG tablet   Commonly known as: COZAAR   Take 25 mg by mouth daily.      pantoprazole 40 MG tablet   Commonly known as: PROTONIX   Take 1 tablet (40 mg total) by mouth 2 (two) times daily.      sertraline 50 MG tablet   Commonly known as: ZOLOFT   Take 50 mg by mouth daily.           Follow-up Information    Follow up with GREEN, Lenon Curt, MD. In 2 weeks.      Follow up with Freddy Jaksch, MD. In 3 weeks.   Contact information:   [address] [place] [address] [phone]           The results of significant  diagnostics from this hospitalization (including imaging, microbiology, ancillary and laboratory) are listed below for reference.     Microbiology: Recent Results (from the past 240 hour(s))  MRSA PCR SCREENING     Status: Normal   Collection Time   10/07/11 11:29 PM      Component Value Range Status Comment   MRSA by PCR NEGATIVE  NEGATIVE Final      Labs: Basic Metabolic Panel:  Lab 10/12/11 0510 10/11/11 0500 10/10/11 0455 10/09/11 0359 10/08/11 0356  NA 137 137 141 141 141  K 3.6 3.6 3.8 4.1 3.8  CL 103 105 111 112 112  CO2 23 22 21 20 19   GLUCOSE 135* 131* 135* 124* 139*  BUN 38* 39* 54* 76* 98*  CREATININE 2.03* 2.08* 2.08* 2.07* 2.15*  CALCIUM 8.5 8.6 8.6 8.4 8.3*  MG -- -- -- -- --  PHOS -- -- -- -- --   Liver Function Tests: No results found for this basename: AST:5,ALT:5,ALKPHOS:5,BILITOT:5,PROT:5,ALBUMIN:5 in the last 168 hours No results found for this basename: LIPASE:5,AMYLASE:5 in the last 168 hours No results found for this basename: AMMONIA:5 in the last 168 hours CBC:  Lab 10/12/11 0510 10/11/11 0500 10/10/11 0455 10/09/11 0359 10/08/11 0530  WBC 9.3 7.2 7.1 6.9 7.2  NEUTROABS -- -- -- -- --  HGB 8.6* 9.2* 8.6* 8.2* 6.5*  HCT 24.9* 26.4* 24.4* 23.3* 18.8*  MCV 88.3 88.9 89.1 88.9 89.2  PLT 142* 144* 123* 112* 134*   Cardiac Enzymes:  Lab 10/08/11 0945 10/08/11 0356 10/07/11 2204 10/07/11 1050  CKTOTAL -- 77 -- 82  CKMB -- -- -- --  CKMBINDEX -- -- -- --  TROPONINI <0.30 <0.30 <0.30 --   BNP: BNP (last 3 results) No results found for this basename: PROBNP:3 in the last 8760 hours CBG:  Lab 10/12/11 0806 10/11/11 2215 10/11/11 1214 10/11/11 0801 10/10/11 2202  GLUCAP 135* 146* 167* 123* 127*     SIGNED: Time coordinating discharge: Over 30 minutes  Debbora Presto, MD  [facility] 10/12/2011, 8:48 AM Pager [phone]  If 7PM-7AM, please contact night-coverage [email or web address] Password [email or web address]

## 2011-10-12 NOTE — Progress Notes (Addendum)
CSW received a call concerning Pt. Pt's son had facility contact [facility] MD to make arrangements for Pt to be accepted in the skilled nursing unit at [facility], instead of son taking Pt back to [place] as was originally planned.   CSW will complete FL2 and fax to facility with new updated d/c summary from MD.   Leron Croak, [facility] Weekend Coverage [phone]    CSW spoke with Clinical Supervisor concerning the ability for [facility] to complete FL2 after Pt is discharged from hospital and CSW can not offer a FL2 to facility after the Pt has been discharged to his Independent living apartment.   CSW contacted Janie at facility concerning this matter and facility understands that Pt stated that he does not want to go to an Assisted Living or Skilled nursing facility.  CSW reiterated to the facility that Pt and son Cleone Slim) both decided that Pt would d/c to Independent living and that his son would make other arrangements with the facility for a higher level of care if needed. Facility again voiced understanding and thanked CSW for informing weekend supervisor and admission Wille Celeste) of this matter.   Facility will attempt to seek assistance with evaluation and placement (if needed) by a physician.   No further needs noted.  Leron Croak, LCSWA [employer] Coverage [phone]

## 2011-10-13 DIAGNOSIS — K922 Gastrointestinal hemorrhage, unspecified: Secondary | ICD-10-CM

## 2011-10-13 HISTORY — DX: Gastrointestinal hemorrhage, unspecified: K92.2

## 2011-10-13 MED FILL — Insulin Aspart Inj 100 Unit/ML: SUBCUTANEOUS | Qty: 0.02 | Status: AC

## 2011-10-13 MED FILL — Insulin Aspart Inj 100 Unit/ML: SUBCUTANEOUS | Qty: 0.01 | Status: AC

## 2012-03-09 ENCOUNTER — Encounter: Payer: Self-pay | Admitting: *Deleted

## 2012-03-11 ENCOUNTER — Encounter: Payer: Self-pay | Admitting: *Deleted

## 2012-04-01 ENCOUNTER — Encounter: Payer: Self-pay | Admitting: Cardiovascular Disease

## 2012-04-05 ENCOUNTER — Emergency Department (HOSPITAL_COMMUNITY): Payer: Medicare Other

## 2012-04-05 ENCOUNTER — Emergency Department (HOSPITAL_COMMUNITY)
Admission: EM | Admit: 2012-04-05 | Discharge: 2012-04-05 | Disposition: A | Discharge status: Home / Self Care | Payer: Medicare Other | Attending: Emergency Medicine | Admitting: Emergency Medicine

## 2012-04-05 ENCOUNTER — Encounter (HOSPITAL_COMMUNITY): Payer: Self-pay | Admitting: Emergency Medicine

## 2012-04-05 DIAGNOSIS — E119 Type 2 diabetes mellitus without complications: Secondary | ICD-10-CM | POA: Insufficient documentation

## 2012-04-05 DIAGNOSIS — Z23 Encounter for immunization: Secondary | ICD-10-CM | POA: Insufficient documentation

## 2012-04-05 DIAGNOSIS — Y92009 Unspecified place in unspecified non-institutional (private) residence as the place of occurrence of the external cause: Secondary | ICD-10-CM | POA: Insufficient documentation

## 2012-04-05 DIAGNOSIS — Z862 Personal history of diseases of the blood and blood-forming organs and certain disorders involving the immune mechanism: Secondary | ICD-10-CM | POA: Insufficient documentation

## 2012-04-05 DIAGNOSIS — F0390 Unspecified dementia without behavioral disturbance: Secondary | ICD-10-CM | POA: Insufficient documentation

## 2012-04-05 DIAGNOSIS — F3289 Other specified depressive episodes: Secondary | ICD-10-CM | POA: Insufficient documentation

## 2012-04-05 DIAGNOSIS — W19XXXA Unspecified fall, initial encounter: Secondary | ICD-10-CM

## 2012-04-05 DIAGNOSIS — Y9389 Activity, other specified: Secondary | ICD-10-CM | POA: Insufficient documentation

## 2012-04-05 DIAGNOSIS — Z8679 Personal history of other diseases of the circulatory system: Secondary | ICD-10-CM | POA: Insufficient documentation

## 2012-04-05 DIAGNOSIS — F329 Major depressive disorder, single episode, unspecified: Secondary | ICD-10-CM | POA: Insufficient documentation

## 2012-04-05 DIAGNOSIS — S0180XA Unspecified open wound of other part of head, initial encounter: Secondary | ICD-10-CM | POA: Insufficient documentation

## 2012-04-05 DIAGNOSIS — Z7982 Long term (current) use of aspirin: Secondary | ICD-10-CM | POA: Insufficient documentation

## 2012-04-05 DIAGNOSIS — N189 Chronic kidney disease, unspecified: Secondary | ICD-10-CM | POA: Insufficient documentation

## 2012-04-05 DIAGNOSIS — W010XXA Fall on same level from slipping, tripping and stumbling without subsequent striking against object, initial encounter: Secondary | ICD-10-CM | POA: Insufficient documentation

## 2012-04-05 DIAGNOSIS — F17200 Nicotine dependence, unspecified, uncomplicated: Secondary | ICD-10-CM | POA: Insufficient documentation

## 2012-04-05 DIAGNOSIS — Z79899 Other long term (current) drug therapy: Secondary | ICD-10-CM | POA: Insufficient documentation

## 2012-04-05 DIAGNOSIS — Z8739 Personal history of other diseases of the musculoskeletal system and connective tissue: Secondary | ICD-10-CM | POA: Insufficient documentation

## 2012-04-05 DIAGNOSIS — S0191XA Laceration without foreign body of unspecified part of head, initial encounter: Secondary | ICD-10-CM

## 2012-04-05 DIAGNOSIS — Z8639 Personal history of other endocrine, nutritional and metabolic disease: Secondary | ICD-10-CM | POA: Insufficient documentation

## 2012-04-05 DIAGNOSIS — I129 Hypertensive chronic kidney disease with stage 1 through stage 4 chronic kidney disease, or unspecified chronic kidney disease: Secondary | ICD-10-CM | POA: Insufficient documentation

## 2012-04-05 DIAGNOSIS — Z950 Presence of cardiac pacemaker: Secondary | ICD-10-CM | POA: Insufficient documentation

## 2012-04-05 DIAGNOSIS — Z8719 Personal history of other diseases of the digestive system: Secondary | ICD-10-CM | POA: Insufficient documentation

## 2012-04-05 MED ORDER — TETANUS-DIPHTH-ACELL PERTUSSIS 5-2.5-18.5 LF-MCG/0.5 IM SUSP
0.5000 mL | Freq: Once | INTRAMUSCULAR | Status: AC
  Administered 2012-04-05: 0.5 mL via INTRAMUSCULAR
  Filled 2012-04-05: qty 0.5

## 2012-04-05 NOTE — ED Notes (Signed)
Bleeding from lac on left forehead.  MD is going to stitch wound.

## 2012-04-05 NOTE — ED Notes (Addendum)
Pt does not have his hearing aides.  Wanting to return to home immediately.  Pt denies pain. States that he tripped on a bad place in the carpet.  Pt answering all question appropriatley EMS stated pt has dementia with associated memory loss.

## 2012-04-05 NOTE — ED Notes (Signed)
Per EMS [facility] for ground level fall after tripping on carpet.  Pt is in independent living although has dementia and deaf.  Pt resisting skilled nursing.  Pt has lac on forehead bleeding controlled.  Abasions left cheek all blleding controlled, No LOC.  Pt has hx of pacemaker, hypertension.  Staff CBG 256 EMS 174/100.  Pt was immobalized for policy.  Pt not co pain.  Pt alert oriented as per baseline

## 2012-04-05 NOTE — ED Provider Notes (Signed)
Medical screening examination/treatment/procedure(s) were conducted as a shared visit with non-physician practitioner(s) and myself.  I personally evaluated the patient during the encounter  Doug Sou, MD 04/05/12 1649

## 2012-04-05 NOTE — ED Provider Notes (Signed)
Level V caveat dementia patient tripped and fell on carpet striking his face. On exam patient is alert follows simple commands motor strength 5 over 5 overall cranial nerves II through XII grossly  Doug Sou, MD 04/05/12 [phone]

## 2012-04-05 NOTE — ED Provider Notes (Signed)
History     CSN: [id number]  Arrival date & time 04/05/12  1006   First MD Initiated Contact with Patient 04/05/12 1013      Chief Complaint  Patient presents with  . Fall    (Consider location/radiation/quality/duration/timing/severity/associated sxs/prior treatment) HPI  Patrick Murray is a 79 y.o. male brought in by EMS status post trip and fall when his foot got caught on the carpet earlier in the day. Patient denies consciousness, headache, neck pain, weakness. Patient is complaining of abrasions and lacerations to face. Level 5 caveat secondary to dementia  Past Medical History  Diagnosis Date  . Cardiac pacemaker in situ     a.fib w/slow ventricular response  . Atrial fibrillation   . Chronic kidney disease, unspecified   . Unspecified essential hypertension   . Other and unspecified hyperlipidemia   . Type II or unspecified type diabetes mellitus without mention of complication, not stated as uncontrolled   . Sinoatrial node dysfunction   . Hypercholesterolemia   . Depression   . Gastric ulcer   . Dementia   . Vitamin D deficiency   . Gait disorder     Past Surgical History  Procedure Laterality Date  . No past surgeries    . Esophagogastroduodenoscopy  10/08/2011    Procedure: ESOPHAGOGASTRODUODENOSCOPY (EGD);  Surgeon: Willis Modena, MD;  Location: [facility];  Service: Endoscopy;  Laterality: N/A;  . Pacemaker generator change  08/05/07    [place]  . Pacemaker insertion  2003  . Appendectomy  1926  . Cholecystectomy  1956  . Tonsillectomy  1926    Family History  Problem Relation Age of Onset  . Heart attack Mother     had a couple  . Heart failure Mother   . Emphysema Father   . Cancer Sister     History  Substance Use Topics  . Smoking status: Current Every Day Smoker -- 5 years    Types: Pipe  . Smokeless tobacco: Never Used  . Alcohol Use: No      Review of Systems  Constitutional: Negative for fever.  Respiratory: Negative  for shortness of breath.   Cardiovascular: Negative for chest pain.  Gastrointestinal: Negative for nausea, vomiting, abdominal pain and diarrhea.  Skin: Positive for wound.  All other systems reviewed and are negative.    Allergies  Aricept and Simvastatin  Home Medications   Current Outpatient Rx  Name  Route  Sig  Dispense  Refill  . amLODipine (NORVASC) 5 MG tablet   Oral   Take 5 mg by mouth daily.           Marland Kitchen amoxicillin (AMOXIL) 500 MG capsule   Oral   Take 1 capsule (500 mg total) by mouth every 12 (twelve) hours.   24 capsule   0   . aspirin (ASPIR-81) 81 MG EC tablet   Oral   Take 81 mg by mouth daily.           . calcitRIOL (ROCALTROL) 0.25 MCG capsule   Oral   Take 0.25 mcg by mouth. 2 tabs even days 1 tab odd days          . clarithromycin (BIAXIN) 250 MG tablet   Oral   Take 1 tablet (250 mg total) by mouth every 12 (twelve) hours.   24 tablet   0   . GALANTAMINE HYDROBROMIDE PO   Oral   Take 16 mg by mouth daily. Take 2 tabs         .  glipiZIDE (GLUCOTROL) 5 MG tablet   Oral   Take 2.5 mg by mouth daily.          . Glucosamine Sulfate POWD   Does not apply   by Does not apply route. 1500mg  once daily          . hydrochlorothiazide 25 MG tablet   Oral   Take 25 mg by mouth daily.           Marland Kitchen losartan (COZAAR) 25 MG tablet   Oral   Take 25 mg by mouth daily.         . pantoprazole (PROTONIX) 40 MG tablet   Oral   Take 1 tablet (40 mg total) by mouth 2 (two) times daily.   60 tablet   3   . sertraline (ZOLOFT) 50 MG tablet   Oral   Take 50 mg by mouth daily.            There were no vitals taken for this visit.  Physical Exam  Nursing note and vitals reviewed. Constitutional: He is oriented to person, place, and time. He appears well-developed and well-nourished. No distress.  HENT:  Head: Normocephalic.  Mouth/Throat: Oropharynx is clear and moist.  Eyes: Conjunctivae and EOM are normal. Pupils are equal,  round, and reactive to light.  Neck:  RichardC-spine collar in place  Cardiovascular: Normal rate, regular rhythm, normal heart sounds and intact distal pulses.   Pulmonary/Chest: Effort normal and breath sounds normal. No stridor. No respiratory distress. He has no wheezes. He has no rales. He exhibits no tenderness.  Abdominal: Soft. Bowel sounds are normal. He exhibits no distension and no mass. There is no tenderness. There is no rebound and no guarding.  Musculoskeletal: Normal range of motion.  Neurological: He is alert and oriented to person, place, and time.  Mild dementia, A. And O. X3. Strength is 4-5x5 extremities, distal sensation is grossly intact.  Skin:  Abrasions to left cheek, full thickness, jjagged 1.5cm laceration above left medial eyebrow, bleeding controlled  Psychiatric: He has a normal mood and affect.    ED Course  Procedures (including critical care time)  LACERATION REPAIR Performed by: Wynetta Emery Authorized by: Wynetta Emery Consent: Verbal consent obtained. Risks and benefits: risks, benefits and alternatives were discussed Consent given by: patient Patient identity confirmed: Wrist band  Prepped and Draped in normal sterile fashion  Tetanus: updated with tDap     Laceration Location: left forehead   Laceration Length: 1.5 cm  Anesthesia: local  Local anesthetic:2% with  withepinephrine  Anesthetic total: 3 ml  Irrigation method: syringe  Amount of cleaning: copious   Wound explored to depth in good light on a bloodless field with no foreign bodies seen or palpated.   Skin closure: 50 Vicryl Rapide  Number of sutures: 3  Technique: simple interupted  Patient tolerance: Patient tolerated the procedure well with no immediate complications.  Antibx ointment applied. Instructions for care discussed verbally and patient provided with additional written instructions for homecare and f/u.   Labs Reviewed - No data to  display No results found.   1. Fall, initial encounter   2. Laceration of head, initial encounter       MDM   Patrick Murray is a 79 y.o. male dementia status post slip and fall with head trauma earlier in the day.   Head and C-spine CT are negative. Laceration closed with absorbable sutures.   Filed Vitals:   04/05/12 1027  BP: 131/101  Pulse: 68  Temp: 98.1 F (36.7 C)  TempSrc: Oral  Resp: 18  SpO2: 99%     Pt verbalized understanding and agrees with care plan. Outpatient follow-up and return precautions given.    This is a shared visit with the attending physician who personally evaluated the patient and agrees with the care plan.        Wynetta Emery, PA-C 04/05/12 1629

## 2012-04-13 ENCOUNTER — Non-Acute Institutional Stay (INDEPENDENT_AMBULATORY_CARE_PROVIDER_SITE_OTHER): Payer: Medicare Other | Admitting: Internal Medicine

## 2012-04-13 ENCOUNTER — Encounter: Payer: Self-pay | Admitting: Internal Medicine

## 2012-04-13 VITALS — BP 138/82 | HR 68 | Wt 157.0 lb

## 2012-04-13 DIAGNOSIS — Z5189 Encounter for other specified aftercare: Secondary | ICD-10-CM

## 2012-04-13 DIAGNOSIS — F0390 Unspecified dementia without behavioral disturbance: Secondary | ICD-10-CM

## 2012-04-13 DIAGNOSIS — S0081XD Abrasion of other part of head, subsequent encounter: Secondary | ICD-10-CM

## 2012-04-13 DIAGNOSIS — K250 Acute gastric ulcer with hemorrhage: Secondary | ICD-10-CM

## 2012-04-13 DIAGNOSIS — R2681 Unsteadiness on feet: Secondary | ICD-10-CM

## 2012-04-13 DIAGNOSIS — N182 Chronic kidney disease, stage 2 (mild): Secondary | ICD-10-CM

## 2012-04-13 DIAGNOSIS — E1149 Type 2 diabetes mellitus with other diabetic neurological complication: Secondary | ICD-10-CM

## 2012-04-13 DIAGNOSIS — Z9181 History of falling: Secondary | ICD-10-CM

## 2012-04-13 DIAGNOSIS — D649 Anemia, unspecified: Secondary | ICD-10-CM

## 2012-04-13 DIAGNOSIS — R269 Unspecified abnormalities of gait and mobility: Secondary | ICD-10-CM

## 2012-04-13 DIAGNOSIS — I10 Essential (primary) hypertension: Secondary | ICD-10-CM

## 2012-04-13 DIAGNOSIS — S01112D Laceration without foreign body of left eyelid and periocular area, subsequent encounter: Secondary | ICD-10-CM

## 2012-04-13 DIAGNOSIS — S0081XA Abrasion of other part of head, initial encounter: Secondary | ICD-10-CM | POA: Insufficient documentation

## 2012-04-13 MED ORDER — PANTOPRAZOLE SODIUM 40 MG PO TBEC: DELAYED_RELEASE_TABLET | ORAL | Status: DC

## 2012-04-13 NOTE — Patient Instructions (Signed)
Stop glipizide  

## 2012-04-14 ENCOUNTER — Encounter: Payer: Self-pay | Admitting: Internal Medicine

## 2012-04-14 DIAGNOSIS — S0081XA Abrasion of other part of head, initial encounter: Secondary | ICD-10-CM | POA: Insufficient documentation

## 2012-04-14 NOTE — Assessment & Plan Note (Signed)
Dementia is progressing. Son is aware of this and is in attendance for today's visit. No treatment staff indicate that the patient is having problems in his apartment. This closer described as dirty and withdraw of blood. His sugars frequently but drawn. He is confused about his appointments. He doesn't know what day of the week it is. Family has arranged for [employer] to give part time help in the apartment released a couple of hours per day 3 times per week.  Staff further reports that the patient is frequently difficult to understand. His words were jumbled and don't make sense. He uses a lot of hand gestures. He is finding it difficult to coordinate answers that others can understand. Patient is coming to the dining room for meals and getting food, however is described as eating very little. Staff to stop bleeding has normal mealtime behavior.  Patient scored poorly on his MMSE with a total score of 21/30. He failed the clock drawing test.

## 2012-04-14 NOTE — Assessment & Plan Note (Signed)
Most recent BUN was 25 and creatinine 2.23 these values are stable.

## 2012-04-14 NOTE — Assessment & Plan Note (Signed)
Continue current medications. Diabetes is under good control.

## 2012-04-14 NOTE — Assessment & Plan Note (Signed)
Well-controlled on current meds 

## 2012-04-14 NOTE — Assessment & Plan Note (Signed)
Patient recently stumbled over the carpet in his own apartment. He had an abrasion of the left face and a cut over the left eyebrow. This required 2 stitches in the emergency room 04/05/12. Additional tests done at that time included a CT of the head and CT of the cervical spine. Chronic brain changes and arthritis of the neck were seen.

## 2012-05-01 ENCOUNTER — Other Ambulatory Visit: Payer: Self-pay | Admitting: Internal Medicine

## 2012-05-14 ENCOUNTER — Non-Acute Institutional Stay: Payer: Medicare Other | Admitting: Nurse Practitioner

## 2012-05-14 DIAGNOSIS — R2681 Unsteadiness on feet: Secondary | ICD-10-CM

## 2012-05-14 DIAGNOSIS — K922 Gastrointestinal hemorrhage, unspecified: Secondary | ICD-10-CM

## 2012-05-14 DIAGNOSIS — N189 Chronic kidney disease, unspecified: Secondary | ICD-10-CM

## 2012-05-14 DIAGNOSIS — I10 Essential (primary) hypertension: Secondary | ICD-10-CM

## 2012-05-14 DIAGNOSIS — R269 Unspecified abnormalities of gait and mobility: Secondary | ICD-10-CM

## 2012-05-14 DIAGNOSIS — E119 Type 2 diabetes mellitus without complications: Secondary | ICD-10-CM

## 2012-05-14 DIAGNOSIS — I4891 Unspecified atrial fibrillation: Secondary | ICD-10-CM

## 2012-05-14 DIAGNOSIS — F0390 Unspecified dementia without behavioral disturbance: Secondary | ICD-10-CM

## 2012-05-14 NOTE — Assessment & Plan Note (Signed)
Continue current medications. Diabetes is under good control. Hypoglycemic episode 05/13/12-CBG dropped from 90s to 60s when EMS checked, he was clammy, shaky, AMS--resolved after blood sugar was corrected. Recently the patient has a comfort keeper--she helps him with medication compliance. Will check CBG daily ac breakfast, hold Glipizide 2.5mg , check Hgb A1c

## 2012-05-14 NOTE — Assessment & Plan Note (Signed)
Rate controlled 

## 2012-05-14 NOTE — Assessment & Plan Note (Signed)
Well controlled on Amlodipine  5mg  and Cozaar 25mg 

## 2012-05-14 NOTE — Assessment & Plan Note (Addendum)
Dementia is progressing. Son is aware of this and is in attendance for today's visit. No treatment staff indicate that the patient is having problems in his apartment. This closer described as dirty and withdraw of blood. His sugars frequently but drawn. He is confused about his appointments. He doesn't know what day of the week it is. Family has arranged for [employer] to give part time help in the apartment released a couple of hours per day 3 times per week.  Staff further reports that the patient is frequently difficult to understand. His words were jumbled and don't make sense. He uses a lot of hand gestures. He is finding it difficult to coordinate answers that others can understand. Patient is coming to the dining room for meals and getting food, however is described as eating very little. Staff to stop bleeding has normal mealtime behavior.  Patient scored poorly on his MMSE with a total score of 21/30. He failed the clock drawing test.  The patient is well groomed today, mentation is at his baseline, his comfort keeper stated he took his medication regularly now--may be this has contributed to his hypoglycemic episode ac lunch 05/13/12. Check TSH

## 2012-05-14 NOTE — Assessment & Plan Note (Signed)
Hx of prepyloric ulcer and duodenal erosion per EGD 09/23/09. Currently off ASA since 01/22/12. Takes Protonix and Fe. F/u CBC

## 2012-05-14 NOTE — Assessment & Plan Note (Signed)
Patient recently stumbled over the carpet in his own apartment. He had an abrasion of the left face and a cut over the left eyebrow. This required 2 stitches in the emergency room 04/05/12. Additional tests done at that time included a CT of the head and CT of the cervical spine. Chronic brain changes and arthritis of the neck were seen. Will decrease Sertraline to 25mg  daily to reduce possible AR contributing to his falling

## 2012-05-14 NOTE — Progress Notes (Signed)
Patient ID: Patrick Murray, male   DOB: [birth year], 79 y.o.   MRN: [id number] Code Status: Living Will  Allergies  Allergen Reactions  . Aricept (Donepezil Hcl) Other (See Comments)    Unknown   . Simvastatin Other (See Comments)    Unknown     Chief Complaint  Patient presents with  . Medical Managment of Chronic Issues    hypoglycemia, AMS    HPI: Patient is a 79 y.o. male seen in the clinic at [facility] today for resolved hypoglycemic episode and AMS,  fall, HTN, dementia. The patient's son Patrick Murray called and requested if the patient can be seen at [facility] due to his difficulty of transferring to [facility] Problem List Items Addressed This Visit     ICD-9-CM   HYPERTENSION     Well controlled on Amlodipine  5mg  and Cozaar 25mg       PAROXYSMAL ATRIAL FIBRILLATION     Rate controlled     RENAL INSUFFICIENCY, CHRONIC     Most recent BUN was 25 and creatinine 2.23 these values are stable. Repeat CMP      Dementia     Dementia is progressing. Son is aware of this and is in attendance for today's visit. No treatment staff indicate that the patient is having problems in his apartment. This closer described as dirty and withdraw of blood. His sugars frequently but drawn. He is confused about his appointments. He doesn't know what day of the week it is. Family has arranged for [employer] to give part time help in the apartment released a couple of hours per day 3 times per week.  Staff further reports that the patient is frequently difficult to understand. His words were jumbled and don't make sense. He uses a lot of hand gestures. He is finding it difficult to coordinate answers that others can understand. Patient is coming to the dining room for meals and getting food, however is described as eating very little. Staff to stop bleeding has normal mealtime behavior.  Patient scored poorly on his MMSE with a total score of 21/30. He failed the clock drawing  test.  The patient is well groomed today, mentation is at his baseline, his comfort keeper stated he took his medication regularly now--may be this has contributed to his hypoglycemic episode ac lunch 05/13/12. Check TSH     RESOLVED: GI bleed     Hx of prepyloric ulcer and duodenal erosion per EGD 09/23/09. Currently off ASA since 01/22/12. Takes Protonix and Fe. F/u CBC    DM - Primary (Chronic)     Continue current medications. Diabetes is under good control. Hypoglycemic episode 05/13/12-CBG dropped from 90s to 60s when EMS checked, he was clammy, shaky, AMS--resolved after blood sugar was corrected. Recently the patient has a comfort keeper--she helps him with medication compliance. Will check CBG daily ac breakfast, hold Glipizide 2.5mg , check Hgb A1c      Gait instability (Chronic)     Patient recently stumbled over the carpet in his own apartment. He had an abrasion of the left face and a cut over the left eyebrow. This required 2 stitches in the emergency room 04/05/12. Additional tests done at that time included a CT of the head and CT of the cervical spine. Chronic brain changes and arthritis of the neck were seen. Will decrease Sertraline to 25mg  daily to reduce possible AR contributing to his falling         Review of Systems:  Review  of Systems  Constitutional: Negative for fever, chills, weight loss, malaise/fatigue and diaphoresis.  HENT: Positive for hearing loss (hearing aids). Negative for congestion, sore throat, neck pain and ear discharge.   Eyes: Negative for pain, discharge and redness.  Respiratory: Negative for cough, sputum production and wheezing.   Cardiovascular: Negative for chest pain, palpitations, orthopnea, claudication, leg swelling and PND.  Gastrointestinal: Negative for heartburn, nausea, vomiting, abdominal pain, diarrhea, constipation and blood in stool.  Genitourinary: Positive for frequency. Negative for dysuria, urgency, hematuria and flank pain.   Musculoskeletal: Positive for falls. Negative for myalgias, back pain and joint pain.  Skin: Negative for itching and rash.  Neurological: Positive for weakness (generalized). Negative for dizziness, tingling, tremors, sensory change, speech change, focal weakness, seizures, loss of consciousness and headaches.  Endo/Heme/Allergies: Negative for environmental allergies and polydipsia. Does not bruise/bleed easily.  Psychiatric/Behavioral: Positive for memory loss. Negative for depression and hallucinations. The patient is nervous/anxious. The patient does not have insomnia.      Past Medical History  Diagnosis Date  . Cardiac pacemaker in situ     a.fib w/slow ventricular response  . Atrial fibrillation   . Myalgia and myositis, unspecified   . Unspecified essential hypertension   . Other and unspecified hyperlipidemia   . Type II or unspecified type diabetes mellitus with renal manifestations, not stated as uncontrolled(250.40)   . Sinoatrial node dysfunction   . Acute posthemorrhagic anemia   . Depression   . Gastric ulcer   . Dementia   . Vitamin D deficiency   . Gait disorder   . Chronic kidney disease, stage II (mild)   . Acute gastric ulcer with hemorrhage, without mention of obstruction   . Other malaise and fatigue   . Edema   . Personal history of fall   . Hemorrhage of gastrointestinal tract, unspecified 10/13/11  . Muscle weakness (generalized)   . Anemia, unspecified   . Other specified disease of sebaceous glands     xerosis cutis  . Unspecified pruritic disorder   . Unspecified urinary incontinence 01/01/10  . Unspecified hearing loss   . Peripheral vascular disease, unspecified   . Syncope and collapse 08/08/2008  . Abrasion of face    Past Surgical History  Procedure Laterality Date  . No past surgeries    . Esophagogastroduodenoscopy  10/08/2011    Procedure: ESOPHAGOGASTRODUODENOSCOPY (EGD);  Surgeon: Willis Modena, MD;  Location: [facility];  Service:  Endoscopy;  Laterality: N/A;  . Pacemaker generator change  08/05/07    [place]  . Pacemaker insertion  2003  . Appendectomy  1926  . Cholecystectomy  1956  . Tonsillectomy  1926   Social History:   reports that he has been smoking Pipe.  He has never used smokeless tobacco. He reports that he does not drink alcohol or use illicit drugs.  Family History  Problem Relation Age of Onset  . Heart attack Mother     had a couple  . Heart failure Mother   . Emphysema Father   . Cancer Sister     Medications: Patient's Medications  New Prescriptions   No medications on file  Previous Medications   CALCITRIOL (ROCALTROL) 0.25 MCG CAPSULE    Take 0.25 mcg by mouth. 2 tabs even days 1 tab odd days    CHOLECALCIFEROL (VITAMIN D) 2000 UNITS TABLET    Take 2,000 Units by mouth daily.   FERROUS SULFATE 325 (65 FE) MG EC TABLET    Take 325 mg by  mouth. Take one tablet daily for iron supplement   GALANTAMINE (RAZADYNE) 8 MG TABLET    TAKE 2 TABLETS DAILY FOR MEMORY   LOSARTAN (COZAAR) 25 MG TABLET    TAKE 1 TABLET DAILY FOR BLOOD PRESSURE.   PANTOPRAZOLE (PROTONIX) 40 MG TABLET    One daily to reduce stomach acid   POLYETHYLENE GLYCOL (MIRALAX / GLYCOLAX) PACKET    Take 17 g by mouth 2 (two) times daily.   SERTRALINE (ZOLOFT) 50 MG TABLET    Take 50 mg by mouth daily.   Modified Medications   No medications on file  Discontinued Medications   No medications on file     Physical Exam: Physical Exam  Constitutional: He appears well-developed and well-nourished. No distress.  HENT:  Head: Normocephalic and atraumatic.  Mouth/Throat: No oropharyngeal exudate.  Eyes: Conjunctivae and EOM are normal. Pupils are equal, round, and reactive to light. Right eye exhibits no discharge. Left eye exhibits no discharge.  Neck: Normal range of motion. No JVD present. No tracheal deviation present. No thyromegaly present.  Cardiovascular: Normal rate, regular rhythm and normal heart sounds.   No murmur  heard. Pulmonary/Chest: Breath sounds normal. No respiratory distress. He has no wheezes. He has no rales. He exhibits no tenderness.  Abdominal: Soft. Bowel sounds are normal. He exhibits no distension. There is no tenderness. There is no rebound.  Musculoskeletal: Normal range of motion. He exhibits no edema and no tenderness.  Lymphadenopathy:    He has no cervical adenopathy.  Neurological: He is alert. He has normal reflexes. He displays normal reflexes. No cranial nerve deficit. He exhibits normal muscle tone. Coordination normal.  Skin: Skin is warm and dry. No rash noted. He is not diaphoretic. No erythema. No pallor.  Psychiatric: His mood appears not anxious. His affect is not angry. His speech is not rapid and/or pressured, not delayed, not tangential and not slurred. He is not agitated, not aggressive, not hyperactive, not slowed, not withdrawn, not actively hallucinating and not combative. Thought content is not paranoid and not delusional. Cognition and memory are impaired. He expresses impulsivity and inappropriate judgment. He does not exhibit a depressed mood. He is communicative. He exhibits abnormal recent memory. He is attentive.       Labs reviewed: Basic Metabolic Panel:  Recent Labs  04/54/09 2204  10/10/11 0455 10/11/11 0500 10/12/11 0510  NA  --   < > 141 137 137  K  --   < > 3.8 3.6 3.6  CL  --   < > 111 105 103  CO2  --   < > 21 22 23   GLUCOSE  --   < > 135* 131* 135*  BUN  --   < > 54* 39* 38*  CREATININE  --   < > 2.08* 2.08* 2.03*  CALCIUM  --   < > 8.6 8.6 8.5  TSH 0.539  --   --   --   --   < > = values in this interval not displayed. Liver Function Tests: No results found for this basename: AST, ALT, ALKPHOS, BILITOT, PROT, ALBUMIN,  in the last 8760 hours No results found for this basename: LIPASE, AMYLASE,  in the last 8760 hours No results found for this basename: AMMONIA,  in the last 8760 hours CBC:  Recent Labs  10/10/11 0455  10/11/11 0500 10/12/11 0510  WBC 7.1 7.2 9.3  HGB 8.6* 9.2* 8.6*  HCT 24.4* 26.4* 24.9*  MCV 89.1 88.9 88.3  PLT 123* 144* 142*   Lipid Panel: No results found for this basename: CHOL, HDL, LDLCALC, TRIG, CHOLHDL, LDLDIRECT,  in the last 8760 hours Anemia Panel: No results found for this basename: FOLATE, IRON, VITAMINB12,  in the last 8760 hours  Past Procedures:     Assessment/Plan DM Continue current medications. Diabetes is under good control. Hypoglycemic episode 05/13/12-CBG dropped from 90s to 60s when EMS checked, he was clammy, shaky, AMS--resolved after blood sugar was corrected. Recently the patient has a comfort keeper--she helps him with medication compliance. Will check CBG daily ac breakfast, hold Glipizide 2.5mg , check Hgb A1c    Gait instability Patient recently stumbled over the carpet in his own apartment. He had an abrasion of the left face and a cut over the left eyebrow. This required 2 stitches in the emergency room 04/05/12. Additional tests done at that time included a CT of the head and CT of the cervical spine. Chronic brain changes and arthritis of the neck were seen. Will decrease Sertraline to 25mg  daily to reduce possible AR contributing to his falling    PAROXYSMAL ATRIAL FIBRILLATION Rate controlled   RENAL INSUFFICIENCY, CHRONIC Most recent BUN was 25 and creatinine 2.23 these values are stable. Repeat CMP    HYPERTENSION Well controlled on Amlodipine  5mg  and Cozaar 25mg     Dementia Dementia is progressing. Son is aware of this and is in attendance for today's visit. No treatment staff indicate that the patient is having problems in his apartment. This closer described as dirty and withdraw of blood. His sugars frequently but drawn. He is confused about his appointments. He doesn't know what day of the week it is. Family has arranged for [employer] to give part time help in the apartment released a couple of hours per day 3 times per  week.  Staff further reports that the patient is frequently difficult to understand. His words were jumbled and don't make sense. He uses a lot of hand gestures. He is finding it difficult to coordinate answers that others can understand. Patient is coming to the dining room for meals and getting food, however is described as eating very little. Staff to stop bleeding has normal mealtime behavior.  Patient scored poorly on his MMSE with a total score of 21/30. He failed the clock drawing test.  The patient is well groomed today, mentation is at his baseline, his comfort keeper stated he took his medication regularly now--may be this has contributed to his hypoglycemic episode ac lunch 05/13/12. Check TSH   GI bleed Hx of prepyloric ulcer and duodenal erosion per EGD 09/23/09. Currently off ASA since 01/22/12. Takes Protonix and Fe. F/u CBC    Family/ Staff Communication:  Monitor CBGs and safety  Goals of Care: [place] as long as possible  Labs/tests ordered: CBC, CMP, TSH, Fasting CBG daily, Hgb A

## 2012-05-14 NOTE — Assessment & Plan Note (Signed)
Most recent BUN was 25 and creatinine 2.23 these values are stable. Repeat CMP

## 2012-05-25 DIAGNOSIS — E1149 Type 2 diabetes mellitus with other diabetic neurological complication: Secondary | ICD-10-CM | POA: Insufficient documentation

## 2012-05-25 NOTE — Progress Notes (Signed)
  Subjective:    Patient ID: Patrick Murray, male    DOB: [birth year], 79 y.o.   MRN: [id number]  HPI Recently tripped on carpet. Sustained cut over the left brow and abrasion of the left forehead and under the nose. Was seen in ER on 04/05/12. Had CT if head and cervical spine.   Patient is clumsy and a high fall risk.  BO stable.  Blood sugars are stable.  Dementia is unchanged. Slowly progressive. Family and patient are still determined to keep him in his apt as long as possible.   Review of Systems  Constitutional: Negative for fever, chills, diaphoresis, activity change, appetite change, fatigue and unexpected weight change.  HENT: Positive for hearing loss. Negative for ear pain, nosebleeds, congestion, facial swelling, neck pain and neck stiffness.   Eyes: Negative.        Corrective lenses.  Respiratory: Negative for cough, chest tightness and shortness of breath.   Cardiovascular: Negative for chest pain, palpitations and leg swelling.  Gastrointestinal: Negative for abdominal pain and abdominal distention.  Endocrine: Negative for cold intolerance, heat intolerance, polydipsia, polyphagia and polyuria.       Diabetic.  Genitourinary: Negative.   Musculoskeletal: Positive for myalgias, arthralgias and gait problem.  Skin: Negative.   Neurological: Positive for dizziness and weakness. Negative for tremors, seizures, syncope, numbness and headaches.  Hematological: Negative.   Psychiatric/Behavioral: Positive for confusion.       Objective:BP 138/82  Pulse 68  Wt 157 lb (71.215 kg)  BMI 21.29 kg/m2    Physical Exam  Constitutional:  Frail elderly male  HENT:  Laceration left eyebrow.. Resolving abrasions of forehead and under the nose.  Eyes: Conjunctivae are normal. Pupils are equal, round, and reactive to light.  Neck: Normal range of motion. Neck supple. No JVD present. No tracheal deviation present. No thyromegaly present.  Cardiovascular: Normal rate,  regular rhythm, normal heart sounds and intact distal pulses.  Exam reveals no gallop and no friction rub.   No murmur heard. Pulmonary/Chest: Effort normal and breath sounds normal. No respiratory distress. He has no wheezes. He has no rales.  Abdominal: Soft. Bowel sounds are normal. He exhibits no distension and no mass. There is no tenderness.  Musculoskeletal: Normal range of motion. He exhibits no edema and no tenderness.  Lymphadenopathy:    He has no cervical adenopathy.  Neurological: He is alert. No cranial nerve deficit. Coordination abnormal.  Skin: Skin is warm and dry. No rash noted. No erythema. No pallor.  Psychiatric: He has a normal mood and affect.  Cheerful. Forgetful.  MMSE: 21/30. Failed clock drawing.  Lab report 04/08/12 CBC: Hgb 11.8   BMP glu 73, BUN 25, creat 2.23  A1c 5.6      Assessment & Plan:  1. Personal history of fall High risk for further falls  2. Acute gastric ulcer with hemorrhage, without mention of obstruction resolved - pantoprazole (PROTONIX) 40 MG tablet; One daily to reduce stomach acid  Dispense: 30 tablet; Refill: 3  3. Chronic kidney disease, stage II (mild) stable  4. Dementia unchanged  5. Type II or unspecified type diabetes mellitus with neurological manifestations, not stated as uncontrolled(250.60) controlled - Hemoglobin A1c; Future - CMP; Future  6. Abrasion of face, subsequent encounter healing  7. HYPERTENSION controlled  8. Gait instability Chronic. unchanged  9. Laceration of eyebrow, left, subsequent encounter healing  10. Anemia, unspecified Follow lab next visit - CBC with Differential

## 2012-06-04 ENCOUNTER — Encounter: Payer: Self-pay | Admitting: Internal Medicine

## 2012-07-13 ENCOUNTER — Encounter: Payer: Self-pay | Admitting: Internal Medicine

## 2012-07-13 ENCOUNTER — Non-Acute Institutional Stay: Payer: Medicare Other | Admitting: Internal Medicine

## 2012-07-13 VITALS — BP 152/76 | HR 72 | Ht 72.0 in | Wt 159.0 lb

## 2012-07-13 DIAGNOSIS — R269 Unspecified abnormalities of gait and mobility: Secondary | ICD-10-CM

## 2012-07-13 DIAGNOSIS — E1149 Type 2 diabetes mellitus with other diabetic neurological complication: Secondary | ICD-10-CM

## 2012-07-13 DIAGNOSIS — D649 Anemia, unspecified: Secondary | ICD-10-CM

## 2012-07-13 DIAGNOSIS — F0390 Unspecified dementia without behavioral disturbance: Secondary | ICD-10-CM

## 2012-07-13 DIAGNOSIS — R2681 Unsteadiness on feet: Secondary | ICD-10-CM

## 2012-07-13 DIAGNOSIS — N182 Chronic kidney disease, stage 2 (mild): Secondary | ICD-10-CM

## 2012-07-13 DIAGNOSIS — I10 Essential (primary) hypertension: Secondary | ICD-10-CM

## 2012-07-13 NOTE — Progress Notes (Signed)
Subjective:    Patient ID: Patrick Murray, male    DOB: 1917-02-03, 79 y.o.   MRN: [id number]  HPI  Anemia: improved  Chronic kidney disease, stage II (mild): stable  Gait instability: unchanged. Using cane.  HYPERTENSION: controlled. Mild elevation of SBP.  Dementia, without behavioral disturbance: uuncahanged. Slowly progressing  Type II or unspecified type diabetes mellitus with neurological manifestations, not stated as uncontrolled(250.60): controlled.  Current Outpatient Prescriptions on File Prior to Visit  Medication Sig Dispense Refill  . Cholecalciferol (VITAMIN D) 2000 UNITS tablet Take 2,000 Units by mouth daily.      . ferrous sulfate 325 (65 FE) MG EC tablet Take 325 mg by mouth. Take one tablet daily for iron supplement      . galantamine (RAZADYNE) 8 MG tablet TAKE 2 TABLETS DAILY FOR MEMORY  180 tablet  0  . losartan (COZAAR) 25 MG tablet TAKE 1 TABLET DAILY FOR BLOOD PRESSURE.  90 tablet  3  . pantoprazole (PROTONIX) 40 MG tablet One daily to reduce stomach acid  30 tablet  3  . sertraline (ZOLOFT) 50 MG tablet Take 50 mg by mouth daily.       . calcitRIOL (ROCALTROL) 0.25 MCG capsule Take 0.25 mcg by mouth. 2 tabs even days 1 tab odd days       . polyethylene glycol (MIRALAX / GLYCOLAX) packet Take 17 g by mouth 2 (two) times daily.       No current facility-administered medications on file prior to visit.     Review of Systems  Constitutional: Negative for fever, chills, diaphoresis, activity change, appetite change, fatigue and unexpected weight change.  HENT: Positive for hearing loss. Negative for ear pain, nosebleeds, congestion, facial swelling, neck pain and neck stiffness.   Eyes: Negative.        Corrective lenses.  Respiratory: Negative for cough, chest tightness and shortness of breath.   Cardiovascular: Negative for chest pain, palpitations and leg swelling.  Gastrointestinal: Negative for abdominal pain and abdominal distention.   Endocrine: Negative for cold intolerance, heat intolerance, polydipsia, polyphagia and polyuria.       Diabetic.  Genitourinary: Negative.   Musculoskeletal: Positive for myalgias, arthralgias and gait problem.  Skin: Negative.   Neurological: Positive for dizziness and weakness. Negative for tremors, seizures, syncope, numbness and headaches.  Hematological: Negative.   Psychiatric/Behavioral: Positive for confusion.       Objective:   Physical Exam  Constitutional:  Frail elderly male  HENT:  Laceration left eyebrow.. Resolving abrasions of forehead and under the nose.  Eyes: Conjunctivae are normal. Pupils are equal, round, and reactive to light.  Neck: Normal range of motion. Neck supple. No JVD present. No tracheal deviation present. No thyromegaly present.  Cardiovascular: Normal rate, regular rhythm, normal heart sounds and intact distal pulses.  Exam reveals no gallop and no friction rub.   No murmur heard. Pulmonary/Chest: Effort normal and breath sounds normal. No respiratory distress. He has no wheezes. He has no rales.  Abdominal: Soft. Bowel sounds are normal. He exhibits no distension and no mass. There is no tenderness.  Musculoskeletal: Normal range of motion. He exhibits no edema and no tenderness.  Lymphadenopathy:    He has no cervical adenopathy.  Neurological: He is alert. No cranial nerve deficit. Coordination abnormal.  Skin: Skin is warm and dry. No rash noted. No erythema. No pallor.  Psychiatric: He has a normal mood and affect.  Cheerful. Forgetful.    Lab reports 05/17/12 CBC Hgb 12.5  CMP: glu 114, BUN 28, Creat 2.31  TSH 1.168  A1c 5.7      Assessment & Plan:  Anemia -improved   Plan: CBC with Differential  Chronic kidney disease, stage II (mild): stable  Gait instability: continue to use cane  HYPERTENSION: adequately controlled. Continue current medications  Dementia, without behavioral disturbance: continue galantamiine  Type II or  unspecified type diabetes mellitus with neurological manifestations, not stated as uncontrolled(250.60) - controlled  Plan: Basic metabolic panel, Hemoglobin A1c

## 2012-07-13 NOTE — Patient Instructions (Signed)
Continue current medications. 

## 2012-08-02 ENCOUNTER — Encounter: Payer: Self-pay | Admitting: Internal Medicine

## 2012-08-03 ENCOUNTER — Other Ambulatory Visit: Payer: Self-pay | Admitting: Geriatric Medicine

## 2012-08-03 MED ORDER — GALANTAMINE HYDROBROMIDE 8 MG PO TABS: 8.0000 mg | ORAL_TABLET | Freq: Two times a day (BID) | ORAL | Status: DC

## 2012-08-10 ENCOUNTER — Encounter: Payer: Self-pay | Admitting: Internal Medicine

## 2012-08-10 ENCOUNTER — Non-Acute Institutional Stay: Payer: Medicare Other | Admitting: Internal Medicine

## 2012-08-10 VITALS — BP 120/62 | HR 72 | Temp 95.4°F | Ht 72.0 in | Wt 159.0 lb

## 2012-08-10 DIAGNOSIS — I10 Essential (primary) hypertension: Secondary | ICD-10-CM

## 2012-08-10 DIAGNOSIS — R2681 Unsteadiness on feet: Secondary | ICD-10-CM

## 2012-08-10 DIAGNOSIS — R05 Cough: Secondary | ICD-10-CM

## 2012-08-10 DIAGNOSIS — I4891 Unspecified atrial fibrillation: Secondary | ICD-10-CM

## 2012-08-10 DIAGNOSIS — D649 Anemia, unspecified: Secondary | ICD-10-CM

## 2012-08-10 DIAGNOSIS — R269 Unspecified abnormalities of gait and mobility: Secondary | ICD-10-CM

## 2012-08-10 NOTE — Progress Notes (Signed)
  Subjective:    Patient ID: Patrick Murray, male    DOB: Jun 13, 1917, 79 y.o.   MRN: [id number]  HPI Coughs with eating and drinking per caretaker. No fever. No significant sputum.  Last CBC showed resolution of anemia  Gait instability persists  Remains in AF.   BP controlled. No dizziness.  Current Outpatient Prescriptions on File Prior to Visit  Medication Sig Dispense Refill  . Cholecalciferol (VITAMIN D) 2000 UNITS tablet Take 2,000 Units by mouth daily.      Marland Kitchen galantamine (RAZADYNE) 8 MG tablet Take 1 tablet (8 mg total) by mouth 2 (two) times daily.  180 tablet  0  . losartan (COZAAR) 25 MG tablet TAKE 1 TABLET DAILY FOR BLOOD PRESSURE.  90 tablet  3  . pantoprazole (PROTONIX) 40 MG tablet One daily to reduce stomach acid  30 tablet  3  . sertraline (ZOLOFT) 50 MG tablet Take 50 mg by mouth daily.        No current facility-administered medications on file prior to visit.    Review of Systems  Constitutional: Negative for fever, chills, diaphoresis, activity change, appetite change, fatigue and unexpected weight change.  HENT: Positive for hearing loss. Negative for ear pain, nosebleeds, congestion, facial swelling, neck pain and neck stiffness.   Eyes: Negative.        Corrective lenses.  Respiratory: Negative for cough, chest tightness and shortness of breath.   Cardiovascular: Negative for chest pain, palpitations and leg swelling.  Gastrointestinal: Negative for abdominal pain and abdominal distention.       Cough with eating and drinking.  Endocrine: Negative for cold intolerance, heat intolerance, polydipsia, polyphagia and polyuria.       Diabetic.  Genitourinary: Negative.   Musculoskeletal: Positive for myalgias, arthralgias and gait problem.  Skin: Negative.   Neurological: Positive for dizziness and weakness. Negative for tremors, seizures, syncope, numbness and headaches.  Hematological: Negative.   Psychiatric/Behavioral: Positive for confusion.        Objective:   Physical Exam  Constitutional:  Frail elderly male  Eyes: Conjunctivae are normal. Pupils are equal, round, and reactive to light.  Neck: Normal range of motion. Neck supple. No JVD present. No tracheal deviation present. No thyromegaly present.  Cardiovascular: Normal rate and intact distal pulses.  Exam reveals no gallop and no friction rub.   No murmur heard. Atrial fib  Pulmonary/Chest: Effort normal. No respiratory distress. He has no wheezes. He has no rales.  Bronchial rattle.  Abdominal: Soft. Bowel sounds are normal. He exhibits no distension and no mass. There is no tenderness.  Musculoskeletal: Normal range of motion. He exhibits no edema and no tenderness.  Lymphadenopathy:    He has no cervical adenopathy.  Neurological: He is alert. No cranial nerve deficit. Coordination abnormal.  Skin: Skin is warm and dry. No rash noted. No erythema. No pallor.  Psychiatric: He has a normal mood and affect.  Cheerful. Forgetful.          Assessment & Plan:  Cough - Plan: SLP modified barium swallow  Anemia: discontinue iron  Gait instability; use cane or walker  PAROXYSMAL ATRIAL FIBRILLATION: rate controlled  HYPERTENSION: controlled

## 2012-08-10 NOTE — Patient Instructions (Signed)
Discontinue iron.

## 2012-08-16 ENCOUNTER — Other Ambulatory Visit: Payer: Self-pay

## 2012-08-16 ENCOUNTER — Other Ambulatory Visit (HOSPITAL_COMMUNITY): Payer: Self-pay | Admitting: Internal Medicine

## 2012-08-16 DIAGNOSIS — R1310 Dysphagia, unspecified: Secondary | ICD-10-CM

## 2012-08-16 DIAGNOSIS — R05 Cough: Secondary | ICD-10-CM

## 2012-08-20 ENCOUNTER — Ambulatory Visit (HOSPITAL_COMMUNITY)
Admission: RE | Admit: 2012-08-20 | Discharge: 2012-08-20 | Disposition: A | Discharge status: Home / Self Care | Payer: Medicare Other | Source: Ambulatory Visit | Attending: Internal Medicine | Admitting: Internal Medicine

## 2012-08-20 DIAGNOSIS — R1310 Dysphagia, unspecified: Secondary | ICD-10-CM

## 2012-08-20 DIAGNOSIS — R059 Cough, unspecified: Secondary | ICD-10-CM

## 2012-08-20 DIAGNOSIS — R05 Cough: Secondary | ICD-10-CM

## 2012-08-20 NOTE — Procedures (Signed)
Objective Swallowing Evaluation: Modified Barium Swallowing Study  Patient Details  Name: Patrick Murray MRN: [id number] Date of Birth: [birth year]  Today's Date: 08/20/2012 Time: 1110-1140 SLP Time Calculation (min): 30 min  Past Medical History:  Past Medical History  Diagnosis Date  . Cardiac pacemaker in situ     a.fib w/slow ventricular response  . Atrial fibrillation   . Myalgia and myositis, unspecified   . Unspecified essential hypertension   . Other and unspecified hyperlipidemia   . Type II or unspecified type diabetes mellitus with renal manifestations, not stated as uncontrolled(250.40)   . Sinoatrial node dysfunction   . Acute posthemorrhagic anemia   . Depression   . Gastric ulcer   . Dementia   . Vitamin D deficiency   . Gait disorder   . Chronic kidney disease, stage II (mild)   . Acute gastric ulcer with hemorrhage, without mention of obstruction   . Other malaise and fatigue   . Edema   . Personal history of fall   . Hemorrhage of gastrointestinal tract, unspecified 10/13/11  . Muscle weakness (generalized)   . Anemia, unspecified   . Other specified disease of sebaceous glands     xerosis cutis  . Unspecified pruritic disorder   . Unspecified urinary incontinence 01/01/10  . Unspecified hearing loss   . Peripheral vascular disease, unspecified   . Syncope and collapse 08/08/2008  . Abrasion of face   . Cough    Past Surgical History:  Past Surgical History  Procedure Laterality Date  . No past surgeries    . Esophagogastroduodenoscopy  10/08/2011    Procedure: ESOPHAGOGASTRODUODENOSCOPY (EGD);  Surgeon: Willis Modena, MD;  Location: [facility];  Service: Endoscopy;  Laterality: N/A;  . Pacemaker generator change  08/05/07    [facility]  . Pacemaker insertion  2003  . Appendectomy  1926  . Cholecystectomy  1956  . Tonsillectomy  1926   HPI:  79 yo hx dementia, HTN, CKD sick sinus syndrome .  Pt has demonstrated increased frequency of coughing  during meals per caregiver at [facility].       Assessment / Plan / Recommendation Clinical Impression  Dysphagia Diagnosis: Mild pharyngeal phase dysphagia Clinical impression: Pt presents with a mild pharyngeal dysphagia characterized by trace aspiration of thin liquids when they were swallowed in conjunction with a 13 mm barium pill.  There are mild pharyngeal delays, and pt intermittently demonstrates penetration of thin liquids when swallowed in isolation - this is considered to be normal in the elderly.  Otherwise, mastication is effective, there is minimal pharyngeal residue, and barium appeared to transition without hesitation through the esophagus upon brief screen.    REC: meds whole with puree; avoid soups, cereals, or other items with mixed solid and liquid consistencies; do not consume solids and liquids simultaneously.  Discussed results/recs with family, who were present for study, watched the video in real time, and verbalized understanding of results/recs.  No further intervention warranted.     Treatment Recommendation  No treatment recommended at this time    Diet Recommendation Regular;Thin liquid   Liquid Administration via: Cup Medication Administration: Whole meds with puree Supervision: Intermittent supervision to cue for compensatory strategies Compensations: Small sips/bites (avoid consuming solids and liquids simultaneously) Postural Changes and/or Swallow Maneuvers: Seated upright 90 degrees         Follow Up Recommendations  None            Pertinent Vitals/Pain No pain    SLP Swallow  Goals     General HDiet Prior to this Study: Regular;Thin liquids Temperature Spikes Noted: No Respiratory Status: Room air History of Recent Intubation: No Behavior/Cognition: Alert;Cooperative;Pleasant mood;Confused Oral Cavity - Dentition: Adequate natural dentition Oral Motor / Sensory Function: Within functional limits Self-Feeding Abilities: Able to feed  self Patient Positioning: Upright in chair Baseline Vocal Quality: Clear Volitional Cough: Strong Volitional Swallow: Able to elicit Anatomy: Within functional limits Pharyngeal Secretions: Not observed secondary MBS    Reason for Referral Objectively evaluate swallowing function   Oral Phase  Oral Phase: WFL   Pharyngeal Phase  Pharyngeal Phase: Impaired Pharyngeal - Thin Pharyngeal - Thin Cup: Delayed swallow initiation;Reduced airway/laryngeal closure;Penetration/Aspiration during swallow;Trace aspiration;Pharyngeal residue - posterior pharnyx Penetration/Aspiration details (thin cup): Material enters airway, remains ABOVE vocal cords then ejected out;Material enters airway, passes BELOW cords without attempt by patient to eject out (silent aspiration) Pharyngeal Comment: flash penetration of thins in isolation; aspiration when swallowed with pill  Cervical Esophageal Phase    GO     Cervical Esophageal Phase: [facility]    Functional Assessment Tool Used: clinical judgement Functional Limitations: Swallowing Swallow Current Status ([id number]): At least 20 percent but less than 40 percent impaired, limited or restricted Swallow Goal Status [phone]): At least 20 percent but less than 40 percent impaired, limited or restricted Swallow Discharge Status [phone]): At least 20 percent but less than 40 percent impaired, limited or restricted    Blenda Mounts Laurice 08/20/2012, 12:07 PM

## 2012-08-24 ENCOUNTER — Encounter: Payer: Self-pay | Admitting: Internal Medicine

## 2012-08-24 ENCOUNTER — Non-Acute Institutional Stay: Payer: Medicare Other | Admitting: Internal Medicine

## 2012-08-24 VITALS — BP 130/72 | HR 80 | Ht 72.0 in | Wt 161.0 lb

## 2012-08-24 DIAGNOSIS — R2681 Unsteadiness on feet: Secondary | ICD-10-CM

## 2012-08-24 DIAGNOSIS — I10 Essential (primary) hypertension: Secondary | ICD-10-CM

## 2012-08-24 DIAGNOSIS — F0390 Unspecified dementia without behavioral disturbance: Secondary | ICD-10-CM

## 2012-08-24 DIAGNOSIS — R1314 Dysphagia, pharyngoesophageal phase: Secondary | ICD-10-CM

## 2012-08-24 DIAGNOSIS — R269 Unspecified abnormalities of gait and mobility: Secondary | ICD-10-CM

## 2012-08-24 NOTE — Progress Notes (Signed)
Subjective:    Patient ID: Patrick Murray, male    DOB: 03/08/1917, 79 y.o.   MRN: [id number]  HPI High risk for falls dueto poor balance. i think the brain deterioration has affected balance control. This is not likely to improve.  Swallowing evaluation showed he has some aspiration  Dysphagia Diagnosis: Mild pharyngeal phase dysphagia  Clinical impression: Pt presents with a mild pharyngeal dysphagia characterized by trace aspiration of thin liquids when they were swallowed in conjunction with a 13 mm barium pill. There are mild pharyngeal delays, and pt intermittently demonstrates penetration of thin liquids when swallowed in isolation - this is considered to be normal in the elderly. Otherwise, mastication is effective, there is minimal pharyngeal residue, and barium appeared to transition without hesitation through the esophagus upon brief screen.   REC: meds whole with puree; avoid soups, cereals, or other items with mixed solid and liquid consistencies; do not consume solids and liquids simultaneously. Discussed results/recs with family, who were present for study, watched the video in real time, and verbalized understanding of results/recs. No further intervention warranted.  Small new skin tear on the right hand.   Current Outpatient Prescriptions on File Prior to Visit  Medication Sig Dispense Refill  . Cholecalciferol (VITAMIN D) 2000 UNITS tablet Take 2,000 Units by mouth daily.      Marland Kitchen galantamine (RAZADYNE) 8 MG tablet Take 1 tablet (8 mg total) by mouth 2 (two) times daily.  180 tablet  0  . losartan (COZAAR) 25 MG tablet TAKE 1 TABLET DAILY FOR BLOOD PRESSURE.  90 tablet  3  . pantoprazole (PROTONIX) 40 MG tablet One daily to reduce stomach acid  30 tablet  3  . sertraline (ZOLOFT) 50 MG tablet Take 50 mg by mouth daily.        No current facility-administered medications on file prior to visit.    Review of Systems  Constitutional: Negative for fever, chills,  diaphoresis, activity change, appetite change, fatigue and unexpected weight change.  HENT: Positive for hearing loss. Negative for ear pain, nosebleeds, congestion, facial swelling, neck pain and neck stiffness.   Eyes: Negative.        Corrective lenses.  Respiratory: Negative for cough, chest tightness and shortness of breath.   Cardiovascular: Negative for chest pain, palpitations and leg swelling.  Gastrointestinal: Negative for abdominal pain and abdominal distention.       Cough with eating and drinking.  Endocrine: Negative for cold intolerance, heat intolerance, polydipsia, polyphagia and polyuria.       Diabetic.  Genitourinary: Negative.   Musculoskeletal: Positive for myalgias, arthralgias and gait problem.  Skin: Negative.   Neurological: Positive for dizziness and weakness. Negative for tremors, seizures, syncope, numbness and headaches.       Balance disturbance.  Hematological: Negative.   Psychiatric/Behavioral: Positive for confusion.       Objective:BP 130/72  Pulse 80  Ht 6' (1.829 m)  Wt 161 lb (73.029 kg)  BMI 21.83 kg/m2  SpO2 97%.med    Physical Exam  Constitutional:  Frail elderly male  Eyes: Conjunctivae are normal. Pupils are equal, round, and reactive to light.  Neck: Normal range of motion. Neck supple. No JVD present. No tracheal deviation present. No thyromegaly present.  Cardiovascular: Normal rate and intact distal pulses.  Exam reveals no gallop and no friction rub.   No murmur heard. Atrial fib  Pulmonary/Chest: Effort normal. No respiratory distress. He has no wheezes. He has no rales.  Bronchial rattle.  Abdominal:  Soft. Bowel sounds are normal. He exhibits no distension and no mass. There is no tenderness.  Musculoskeletal: Normal range of motion. He exhibits no edema and no tenderness.  Poor balance.  Lymphadenopathy:    He has no cervical adenopathy.  Neurological: He is alert. No cranial nerve deficit. Coordination abnormal.  Skin:  Skin is warm and dry. No rash noted. No erythema. No pallor.  Healing 2 cm laceration of the dorsum poof the right hand.  Psychiatric: He has a normal mood and affect.  Cheerful. Forgetful.          Assessment & Plan:  HYPERTENSION: controlled  Dementia: stubborn. Easily confused.  Gait instability: use walker. He insists on cane.  Dysphagia, pharyngoesophageal phase: avoid dry crumbly food. Take small bites.

## 2012-08-24 NOTE — Patient Instructions (Signed)
Follow directions of your caretaker.

## 2012-09-01 ENCOUNTER — Emergency Department (HOSPITAL_COMMUNITY): Payer: Medicare Other

## 2012-09-01 ENCOUNTER — Encounter (HOSPITAL_COMMUNITY): Payer: Self-pay | Admitting: Emergency Medicine

## 2012-09-01 ENCOUNTER — Emergency Department (HOSPITAL_COMMUNITY)
Admission: EM | Admit: 2012-09-01 | Discharge: 2012-09-01 | Disposition: A | Discharge status: Home / Self Care | Payer: Medicare Other | Attending: Emergency Medicine | Admitting: Emergency Medicine

## 2012-09-01 DIAGNOSIS — S0180XA Unspecified open wound of other part of head, initial encounter: Secondary | ICD-10-CM | POA: Insufficient documentation

## 2012-09-01 DIAGNOSIS — Z872 Personal history of diseases of the skin and subcutaneous tissue: Secondary | ICD-10-CM | POA: Insufficient documentation

## 2012-09-01 DIAGNOSIS — H9190 Unspecified hearing loss, unspecified ear: Secondary | ICD-10-CM | POA: Insufficient documentation

## 2012-09-01 DIAGNOSIS — I129 Hypertensive chronic kidney disease with stage 1 through stage 4 chronic kidney disease, or unspecified chronic kidney disease: Secondary | ICD-10-CM | POA: Insufficient documentation

## 2012-09-01 DIAGNOSIS — Z8739 Personal history of other diseases of the musculoskeletal system and connective tissue: Secondary | ICD-10-CM | POA: Insufficient documentation

## 2012-09-01 DIAGNOSIS — Z79899 Other long term (current) drug therapy: Secondary | ICD-10-CM | POA: Insufficient documentation

## 2012-09-01 DIAGNOSIS — M542 Cervicalgia: Secondary | ICD-10-CM

## 2012-09-01 DIAGNOSIS — R040 Epistaxis: Secondary | ICD-10-CM | POA: Insufficient documentation

## 2012-09-01 DIAGNOSIS — S0990XA Unspecified injury of head, initial encounter: Secondary | ICD-10-CM

## 2012-09-01 DIAGNOSIS — Z950 Presence of cardiac pacemaker: Secondary | ICD-10-CM | POA: Insufficient documentation

## 2012-09-01 DIAGNOSIS — Z8639 Personal history of other endocrine, nutritional and metabolic disease: Secondary | ICD-10-CM | POA: Insufficient documentation

## 2012-09-01 DIAGNOSIS — Y9301 Activity, walking, marching and hiking: Secondary | ICD-10-CM | POA: Insufficient documentation

## 2012-09-01 DIAGNOSIS — Z87891 Personal history of nicotine dependence: Secondary | ICD-10-CM | POA: Insufficient documentation

## 2012-09-01 DIAGNOSIS — Z87828 Personal history of other (healed) physical injury and trauma: Secondary | ICD-10-CM | POA: Insufficient documentation

## 2012-09-01 DIAGNOSIS — F329 Major depressive disorder, single episode, unspecified: Secondary | ICD-10-CM | POA: Insufficient documentation

## 2012-09-01 DIAGNOSIS — S0181XA Laceration without foreign body of other part of head, initial encounter: Secondary | ICD-10-CM

## 2012-09-01 DIAGNOSIS — Z8719 Personal history of other diseases of the digestive system: Secondary | ICD-10-CM | POA: Insufficient documentation

## 2012-09-01 DIAGNOSIS — W1809XA Striking against other object with subsequent fall, initial encounter: Secondary | ICD-10-CM | POA: Insufficient documentation

## 2012-09-01 DIAGNOSIS — M7989 Other specified soft tissue disorders: Secondary | ICD-10-CM | POA: Insufficient documentation

## 2012-09-01 DIAGNOSIS — Z8711 Personal history of peptic ulcer disease: Secondary | ICD-10-CM | POA: Insufficient documentation

## 2012-09-01 DIAGNOSIS — Z862 Personal history of diseases of the blood and blood-forming organs and certain disorders involving the immune mechanism: Secondary | ICD-10-CM | POA: Insufficient documentation

## 2012-09-01 DIAGNOSIS — Z8679 Personal history of other diseases of the circulatory system: Secondary | ICD-10-CM | POA: Insufficient documentation

## 2012-09-01 DIAGNOSIS — S0993XA Unspecified injury of face, initial encounter: Secondary | ICD-10-CM | POA: Insufficient documentation

## 2012-09-01 DIAGNOSIS — Z9181 History of falling: Secondary | ICD-10-CM | POA: Insufficient documentation

## 2012-09-01 DIAGNOSIS — W19XXXA Unspecified fall, initial encounter: Secondary | ICD-10-CM

## 2012-09-01 DIAGNOSIS — E559 Vitamin D deficiency, unspecified: Secondary | ICD-10-CM | POA: Insufficient documentation

## 2012-09-01 DIAGNOSIS — W010XXA Fall on same level from slipping, tripping and stumbling without subsequent striking against object, initial encounter: Secondary | ICD-10-CM | POA: Insufficient documentation

## 2012-09-01 DIAGNOSIS — Y92410 Unspecified street and highway as the place of occurrence of the external cause: Secondary | ICD-10-CM | POA: Insufficient documentation

## 2012-09-01 DIAGNOSIS — F3289 Other specified depressive episodes: Secondary | ICD-10-CM | POA: Insufficient documentation

## 2012-09-01 DIAGNOSIS — N182 Chronic kidney disease, stage 2 (mild): Secondary | ICD-10-CM | POA: Insufficient documentation

## 2012-09-01 DIAGNOSIS — E1129 Type 2 diabetes mellitus with other diabetic kidney complication: Secondary | ICD-10-CM | POA: Insufficient documentation

## 2012-09-01 DIAGNOSIS — F0390 Unspecified dementia without behavioral disturbance: Secondary | ICD-10-CM | POA: Insufficient documentation

## 2012-09-01 LAB — URINALYSIS, ROUTINE W REFLEX MICROSCOPIC
Bilirubin Urine: NEGATIVE
Ketones, ur: NEGATIVE mg/dL
Leukocytes, UA: NEGATIVE
Nitrite: NEGATIVE
Urobilinogen, UA: 1 mg/dL (ref 0.0–1.0)
pH: 6 (ref 5.0–8.0)

## 2012-09-01 NOTE — ED Notes (Addendum)
Per EMS patient with early dementia fell walking through entranceway with a cane, staff witnessed patient trip and fall forehead and strike his head on the carpet without LOC. Per EMS patient has a small laceration to the forehead and abrasion to the nose, denies neck pain, back pain, vision changes, headache. Patient has atrial fibrillation and a pacemaker. Per EMS [facility] states patient is alert and oriented at baseline.

## 2012-09-01 NOTE — ED Notes (Signed)
Patient transported to CT 

## 2012-09-01 NOTE — ED Notes (Signed)
Patient walked >26ft with assistance. States that he uses a cane at home.

## 2012-09-01 NOTE — ED Notes (Signed)
Bed: WA18 Expected date:  Expected time:  Means of arrival:  Comments: ems 

## 2012-09-01 NOTE — ED Provider Notes (Signed)
CSN: [id number]     Arrival date & time 09/01/12  1547 History   None    Chief Complaint  Patient presents with  . Fall    HPI   Patrick Murray is a 79 year old male with a PMH of atrial fibrillation HTN, type II DM, sinoatrial node dysfunction s/p cardiac pacemaker, gastric ulcer, dementia, gait disorder, CKD, hx of falls, anemia, hearing loss, and syncope who presents to the ED for evaluation of a fall.  Patient is alert and oriented x 3 in the ED.  Throughout H&P does repeat himself and continue to ask questions that were already answered.  Patient was walking down the hallway with a cane a few hours prior to arrival, tripped and fell forward hitting his forehead.  He denies any LOC.  Fall was witnessed by staff.  Patient states he has some neck pain, but he states "that was there before the fall."  He denies any headache, dizziness, lightheadedness, chest pain, SOB, abdominal pain, nausea, emesis, pelvic pain, extremity pain, loss of sensation, or weakness.  He has a laceration to his forehead and a abrasion to his nose.  He denies any other wounds.  He denies being on any anti-coagulation.  He has a history of falls, but is unsure when his last fall was. Patient lives at an independent living facility.     Past Medical History  Diagnosis Date  . Cardiac pacemaker in situ     a.fib w/slow ventricular response  . Atrial fibrillation   . Myalgia and myositis, unspecified   . Unspecified essential hypertension   . Other and unspecified hyperlipidemia   . Type II or unspecified type diabetes mellitus with renal manifestations, not stated as uncontrolled(250.40)   . Sinoatrial node dysfunction   . Acute posthemorrhagic anemia   . Depression   . Gastric ulcer   . Dementia   . Vitamin D deficiency   . Gait disorder   . Chronic kidney disease, stage II (mild)   . Acute gastric ulcer with hemorrhage, without mention of obstruction   . Other malaise and fatigue   . Edema   . Personal  history of fall   . Hemorrhage of gastrointestinal tract, unspecified 10/13/11  . Muscle weakness (generalized)   . Anemia, unspecified   . Other specified disease of sebaceous glands     xerosis cutis  . Unspecified pruritic disorder   . Unspecified urinary incontinence 01/01/10  . Unspecified hearing loss   . Peripheral vascular disease, unspecified   . Syncope and collapse 08/08/2008  . Abrasion of face   . Cough    Past Surgical History  Procedure Laterality Date  . No past surgeries    . Esophagogastroduodenoscopy  10/08/2011    Procedure: ESOPHAGOGASTRODUODENOSCOPY (EGD);  Surgeon: Willis Modena, MD;  Location: [facility];  Service: Endoscopy;  Laterality: N/A;  . Pacemaker generator change  08/05/07    [facility]  . Pacemaker insertion  2003  . Appendectomy  1926  . Cholecystectomy  1956  . Tonsillectomy  1926   Family History  Problem Relation Age of Onset  . Heart attack Mother     had a couple  . Heart failure Mother   . Emphysema Father   . Cancer Sister    History  Substance Use Topics  . Smoking status: Former Smoker -- 5 years    Types: Pipe  . Smokeless tobacco: Never Used  . Alcohol Use: No    Review of Systems  HENT: Positive for hearing loss (at baseline), nosebleeds and neck pain. Negative for ear pain, congestion, sore throat, rhinorrhea, neck stiffness and sinus pressure.   Eyes: Negative for visual disturbance.  Respiratory: Negative for cough, shortness of breath and wheezing.   Cardiovascular: Positive for leg swelling (at baseline). Negative for chest pain.  Gastrointestinal: Negative for nausea, vomiting and abdominal pain.  Genitourinary: Negative for dysuria.  Musculoskeletal: Negative for back pain.  Skin: Positive for wound.  Neurological: Negative for dizziness, syncope, weakness, light-headedness, numbness and headaches.    Allergies  Aricept and Simvastatin  Home Medications   Current Outpatient Rx  Name  Route  Sig  Dispense   Refill  . Cholecalciferol (VITAMIN D) 2000 UNITS tablet   Oral   Take 2,000 Units by mouth daily.         Marland Kitchen galantamine (RAZADYNE) 8 MG tablet   Oral   Take 1 tablet (8 mg total) by mouth 2 (two) times daily.   180 tablet   0   . losartan (COZAAR) 25 MG tablet      TAKE 1 TABLET DAILY FOR BLOOD PRESSURE.   90 tablet   3   . pantoprazole (PROTONIX) 40 MG tablet      One daily to reduce stomach acid   30 tablet   3   . sertraline (ZOLOFT) 50 MG tablet   Oral   Take 50 mg by mouth daily.           BP 166/85  Pulse 64  Temp(Src) 98.3 F (36.8 C) (Oral)  Resp 23  SpO2 99%  Filed Vitals:   09/01/12 1548 09/01/12 1653 09/01/12 2032  BP: 166/85 186/73 174/87  Pulse: 64 76 92  Temp: 98.3 F (36.8 C) 98.7 F (37.1 C)   TempSrc: Oral Oral   Resp: 23 20 20   SpO2: 99% 96% 96%    Physical Exam  Nursing note and vitals reviewed. Constitutional: He is oriented to person, place, and time. He appears well-developed and well-nourished. No distress.  HENT:  Head: Normocephalic.    Right Ear: External ear normal.  Left Ear: External ear normal.  Nose: Nose normal.  Mouth/Throat: Oropharynx is clear and moist. No oropharyngeal exudate.  1 cm x 2 cm hematoma to the left forehead above the eyebrow with a 2 cm laceration within the hematoma. TM's gray and translucent bilaterally.  Hearing aids present bilaterally.  Nasal turbinates without hematoma or laceration.  No blood seen in the nasal cavities bilaterally.  No nasal bridge tenderness to palpation.    Eyes: Conjunctivae and EOM are normal. Pupils are equal, round, and reactive to light. Right eye exhibits no discharge. Left eye exhibits no discharge.  Neck: Normal range of motion. Neck supple.  No tenderness to palpation to the cervical or paraspinal muscles   Cardiovascular: Regular rhythm, normal heart sounds and intact distal pulses.  Exam reveals no gallop and no friction rub.   No murmur heard. Irregular rate.  Dorsalis pedis and radial pulses present and equal bilaterally  Pulmonary/Chest: Effort normal and breath sounds normal. No respiratory distress. He has no wheezes. He has no rales. He exhibits no tenderness.  Abdominal: Soft. Bowel sounds are normal. He exhibits no distension and no mass. There is no tenderness. There is no rebound and no guarding.  Musculoskeletal: Normal range of motion. He exhibits edema. He exhibits no tenderness.  Trace pitting edema bilaterally.  No thoracic or lumbar spinal tenderness throughout.  No paraspinal tenderness throughout.  Patient able to assist himself up for exam  Neurological: He is alert and oriented to person, place, and time.  GCS 15.  No focal neurological deficits.  CN 2-12 intact.  Gross sensation intact in the upper and lower extremities.  Strength 5/5 in the upper and lower extremities.  Skin: Skin is warm and dry. He is not diaphoretic.  Abrasion to the philtrum on the left.  No surrounding edema or erythema.      ED Course  LACERATION REPAIR Date/Time: 09/01/2012 7:00 PM Performed by: Coral Ceo K Authorized by: Jillyn Ledger Consent: Verbal consent obtained. Consent given by: patient Patient identity confirmed: verbally with patient Body area: head/neck Location details: forehead Laceration length: 2 cm Foreign bodies: no foreign bodies Tendon involvement: none Nerve involvement: none Vascular damage: no Anesthesia: local infiltration Local anesthetic: lidocaine 1% without epinephrine Anesthetic total: 5 ml Patient sedated: no Preparation: Patient was prepped and draped in the usual sterile fashion. Irrigation solution: saline Irrigation method: jet lavage Amount of cleaning: standard Debridement: none Degree of undermining: none Skin closure: Ethilon Number of sutures: 3 Technique: simple Approximation: close Approximation difficulty: simple Dressing: antibiotic ointment Patient tolerance: Patient tolerated the  procedure well with no immediate complications.   (including critical care time) Labs Review Labs Reviewed - No data to display Imaging Review No results found.      CT Head Wo Contrast (Final result)  Result time: 09/01/12 17:05:34    Final result by Rad Results In Interface (09/01/12 17:05:34)    Narrative:   CLINICAL DATA: Fall with injury to the forehead. Neck pain. Forehead laceration.  EXAM: CT HEAD WITHOUT CONTRAST  CT CERVICAL SPINE WITHOUT CONTRAST  TECHNIQUE: Multidetector CT imaging of the head and cervical spine was performed following the standard protocol without intravenous contrast. Multiplanar CT image reconstructions of the cervical spine were also generated.  COMPARISON: 04/05/2012  FINDINGS: CT HEAD FINDINGS  Chronic deformity, left maxillary sinus with a bony growth and irregularity along the inferior -lateral sinus as before  These cerebellum, brain stem, calamine, basilar ganglia, basilar cisterns, and ventricular system remain within normal limits, with ex vacuo prominence of the lateral ventricles which appear stable. Left frontal scalp hematoma with laceration observed. No intracranial hemorrhage, mass lesion, or acute CVA. Periventricular white matter and corona radiata hypodensities favor chronic ischemic microvascular white matter disease.  CT CERVICAL SPINE FINDINGS  Similar appearance of anterior bridging spurring at C2-C3-C4-C5, with loss of disc height at all levels between C4 and T1 but especially at C4-5 and C7-T1.  Posterior osseous ridging noted particularly at C4-5 and C5-6. Osseous foraminal narrowing due to uncinate and facet arthropathy on the right at C4-5 and C5-6, and on the left at C4-5, C5-6, and C6-7.  There is 1 mm of chronic degenerative anterior subluxation at C5-6.  Fragmented osteophyte is present between the be easy on and the tip of the odontoid. Bone island in the left pars region at C3, stable.  No  cervical spine fracture or static instability observed.  IMPRESSION: CT HEAD IMPRESSION  1. No acute intracranial findings. Left frontal forehead scalp hematoma and laceration. 2. Periventricular white matter and corona radiata hypodensities favor chronic ischemic microvascular white matter disease. 3. Chronic bony growth in the left maxillary sinus, unchanged from 2013, potentially from osteoma or fibrous dysplasia.  CT CERVICAL SPINE IMPRESSION  1. Prominent cervical spondylosis and degenerative disc disease, without fracture or acute subluxation observed.   Electronically Signed By: Herbie Baltimore On: 09/01/2012 17:05  Results for orders placed during the hospital encounter of 09/01/12  URINALYSIS, ROUTINE W REFLEX MICROSCOPIC      Result Value Range   Color, Urine YELLOW  YELLOW   APPearance CLEAR  CLEAR   Specific Gravity, Urine 1.018  1.005 - 1.030   pH 6.0  5.0 - 8.0   Glucose, UA NEGATIVE  NEGATIVE mg/dL   Hgb urine dipstick SMALL (*) NEGATIVE   Bilirubin Urine NEGATIVE  NEGATIVE   Ketones, ur NEGATIVE  NEGATIVE mg/dL   Protein, ur 30 (*) NEGATIVE mg/dL   Urobilinogen, UA 1.0  0.0 - 1.0 mg/dL   Nitrite NEGATIVE  NEGATIVE   Leukocytes, UA NEGATIVE  NEGATIVE  GLUCOSE, CAPILLARY      Result Value Range   Glucose-Capillary 165 (*) 70 - 99 mg/dL  URINE MICROSCOPIC-ADD ON      Result Value Range   Squamous Epithelial / LPF FEW (*) RARE   WBC, UA 3-6  <3 WBC/hpf   Bacteria, UA RARE  RARE  GLUCOSE, CAPILLARY      Result Value Range   Glucose-Capillary 134 (*) 70 - 99 mg/dL    Date: 16/10/9602  Rate: 81  Rhythm: atrial fibrillation  QRS Axis: normal  Intervals: normal  ST/T Wave abnormalities: normal  Conduction Disutrbances:none  Narrative Interpretation:   Old EKG Reviewed: 10/07/11 unchanged  MDM   1. Head injury, closed, without LOC, initial encounter   2. Neck pain   3. Laceration of forehead, initial encounter   4. Fall, initial encounter       JYQUAN KENLEY is a 79 year old male with a PMH of atrial fibrillation HTN, type II DM, sinoatrial node dysfunction s/p cardiac pacemaker, gastric ulcer, dementia, gait disorder, CKD, hx of falls, anemia, hearing loss, and syncope who presents to the ED for evaluation of a fall.  EKG, POC glucose, UA, head and neck CT ordered to further evaluate.  Last Tdap administration on = 04/05/2012.    Rechecks  4:45 PM = Patient's caregiver at bedside who states that the patient is at his baseline cognitively.  She also confirms the story that was given per EMS.  She was not present during fall.  Patient going for CT.   5:47 PM = Patient resting comfortably.  No complaints.   7:00 PM = Laceration repaired at bedside.  Abrasion dressed with ABX ointment and bandage.   7:50 PM = Spoke with patient's son who is agreement with discharge.  He states that the caregiver will go home and stay with the patient until he is asleep and she will return early in the morning to check on him.  He does not feel that [place] needs observation overnight and "this has happened before" and "he was not using his walker walking down the hallway" and "he has been told this before."      Patient was evaluated in the ED for a fall.  History provided by staff and patient suggest a mechanical fall. Head CT was negative for an acute intracranial injury.  His neurological examination revealed no focal neurological deficits.  Patient is at his baseline cognitively per home staff who is present in the ED.  His laceration was repaired in the ED.  Patient was instructed to have his sutures removed in 7 days.  He was instructed to cleans the wound daily.  Patient's cervical CT was negative for fracture or dislocation.  His neck pain appears chronic in nature.  Patient was observed in the ED for  5 hours and remained in no acute distress.  He will be going home with his caregiver who is present in the ED.  She will stay with him until he  falls asleep and check on him tomorrow morning.  Spoke with son who is in agreement with this plan.  Patient was instructed to return to the ED if he develops any severe headache, dizziness, lightheadedness, chest pain, SOB, severe abdominal pain, weakness, repeated vomiting, or other concerns.  He was in agreement with discharge and plan.  Patient was able to ambulate without difficulty upon discharge.     Final impressions: 1. Fall, mechanical 2. Head injury without LOC 3. Forehead laceration, left  4. Neck pain, chronic      Greer Ee Keilynn Marano PA-C    Jillyn Ledger, [place] 09/02/12 1009

## 2012-09-07 ENCOUNTER — Encounter: Payer: Self-pay | Admitting: Nurse Practitioner

## 2012-09-07 ENCOUNTER — Non-Acute Institutional Stay: Payer: Medicare Other | Admitting: Nurse Practitioner

## 2012-09-07 DIAGNOSIS — R269 Unspecified abnormalities of gait and mobility: Secondary | ICD-10-CM

## 2012-09-07 DIAGNOSIS — R1314 Dysphagia, pharyngoesophageal phase: Secondary | ICD-10-CM

## 2012-09-07 DIAGNOSIS — IMO0002 Reserved for concepts with insufficient information to code with codable children: Secondary | ICD-10-CM

## 2012-09-07 DIAGNOSIS — I10 Essential (primary) hypertension: Secondary | ICD-10-CM

## 2012-09-07 DIAGNOSIS — E1149 Type 2 diabetes mellitus with other diabetic neurological complication: Secondary | ICD-10-CM

## 2012-09-07 DIAGNOSIS — S0083XA Contusion of other part of head, initial encounter: Secondary | ICD-10-CM | POA: Insufficient documentation

## 2012-09-07 DIAGNOSIS — S0083XS Contusion of other part of head, sequela: Secondary | ICD-10-CM

## 2012-09-07 DIAGNOSIS — R2681 Unsteadiness on feet: Secondary | ICD-10-CM

## 2012-09-07 DIAGNOSIS — F0390 Unspecified dementia without behavioral disturbance: Secondary | ICD-10-CM

## 2012-09-07 NOTE — Progress Notes (Signed)
Patient ID: Patrick Murray, male   DOB: January 28, 1917, 79 y.o.   MRN: [id number] Code Status: Living Will  Allergies  Allergen Reactions  . Aricept [Donepezil Hcl] Other (See Comments)    Unknown   . Simvastatin Other (See Comments)    Unknown     Chief Complaint  Patient presents with  . Medical Managment of Chronic Issues    f/u s/p fall, soreness at multiple sites, left periorbital bruise, left eyebrow sutures x3    HPI: Patient is a 79 y.o. male seen in the clinic at [facility] today for f/u s/p fall, aches,  and other chronic medical conditions. Larey Seat 09/01/12, ER evaluation: CT head/Cervical spine unremarkable, left eyebrow 3 sutures-healed and removed today, left periorbital contusion-healing, c/o aches neck/shoudler blade, chest wall, left leg in the past a few day per his [employer] but Patrick Murray denied pain today upon my visit. Able to ambulates with walker and no decreased ROM of all limbs noted.  Problem List Items Addressed This Visit   Dementia (Chronic)     Dementia is progressing. Son is aware of this. Family has arranged for [employer] to give part time help in the apartment released a couple of hours per day 3 times per week. Patient scored poorly on his MMSE with a total score of 21/30. Patrick Murray failed the clock drawing test. Patrick Murray is lack of safety awareness and needs verbal cue for many things including walker.       Dysphagia, pharyngoesophageal phase     Diet modification. Recent swallow evaluation: Dysphagia, pharyngoesophageal phase: avoid dry crumbly food. Take small bitesation.     Facial contusion     Left periorbital-no change in vision, ear canal exam showed no s/o bleeding or bruise. Should heal.     Gait instability (Chronic)     Patient stumbled over the carpet in his own apartment. Patrick Murray had an abrasion of the left face and a cut over the left eyebrow. This required 2 stitches in the emergency room 04/05/12. Additional tests done at that time included a  CT of the head and CT of the cervical spine. Chronic brain changes and arthritis of the neck were seen.   Larey Seat 09/01/12, sustained left periorbital contusion, left eyebrow laceration with 3 sutures closures-healed and sutures removed today w/o difficulty. CT head/spine 09/01/12  CT HEAD: IMPRESSION  1. No acute intracranial findings. Left frontal forehead scalp hematoma and laceration. 2. Periventricular white matter and corona radiat hypodensities favor chronic ischemic microvascular white matter disease. 3. Chronic bony growth in the left maxillary sinus, unchanged from 2013, potentially from osteoma or fibrous dysplasia. CT CERVICAL SPINE IMPRESSION 1. Prominent cervical spondylosis and degenerative disc disease, without fracture or acutesubluxation observed.        HYPERTENSION (Chronic)     Well controlled on Losartan.         Type II or unspecified type diabetes mellitus with neurological manifestations, not stated as uncontrolled(250.60) - Primary (Chronic)     Off all blood sugar medications. Fasting CBG average 120-140s, continue diet control.            Review of Systems:  Review of Systems  Constitutional: Negative for fever, chills, weight loss, malaise/fatigue and diaphoresis.  HENT: Positive for hearing loss (hearing aids) and neck pain. Negative for congestion, sore throat and ear discharge.   Eyes: Negative for pain, discharge and redness.  Respiratory: Negative for cough, sputum production and wheezing.   Cardiovascular: Negative for chest pain, palpitations,  orthopnea, claudication, leg swelling and PND.  Gastrointestinal: Negative for heartburn, nausea, vomiting, abdominal pain, diarrhea, constipation and blood in stool.  Genitourinary: Positive for frequency. Negative for dysuria, urgency, hematuria and flank pain.  Musculoskeletal: Positive for back pain, joint pain and falls. Negative for myalgias.  Skin: Negative for itching and rash.       Left eyebrow  sutures removed with the site mild erythema. The left peri-orbital bruise is healing.   Neurological: Positive for weakness (generalized). Negative for dizziness, tingling, tremors, sensory change, speech change, focal weakness, seizures, loss of consciousness and headaches.  Endo/Heme/Allergies: Negative for environmental allergies and polydipsia. Does not bruise/bleed easily.  Psychiatric/Behavioral: Positive for memory loss. Negative for depression and hallucinations. The patient is nervous/anxious. The patient does not have insomnia.      Past Medical History  Diagnosis Date  . Cardiac pacemaker in situ     a.fib w/slow ventricular response  . Atrial fibrillation   . Myalgia and myositis, unspecified   . Unspecified essential hypertension   . Other and unspecified hyperlipidemia   . Type II or unspecified type diabetes mellitus with renal manifestations, not stated as uncontrolled(250.40)   . Sinoatrial node dysfunction   . Acute posthemorrhagic anemia   . Depression   . Gastric ulcer   . Dementia   . Vitamin D deficiency   . Gait disorder   . Chronic kidney disease, stage II (mild)   . Acute gastric ulcer with hemorrhage, without mention of obstruction   . Other malaise and fatigue   . Edema   . Personal history of fall   . Hemorrhage of gastrointestinal tract, unspecified 10/13/11  . Muscle weakness (generalized)   . Anemia, unspecified   . Other specified disease of sebaceous glands     xerosis cutis  . Unspecified pruritic disorder   . Unspecified urinary incontinence 01/01/10  . Unspecified hearing loss   . Peripheral vascular disease, unspecified   . Syncope and collapse 08/08/2008  . Abrasion of face   . Cough    Past Surgical History  Procedure Laterality Date  . No past surgeries    . Esophagogastroduodenoscopy  10/08/2011    Procedure: ESOPHAGOGASTRODUODENOSCOPY (EGD);  Surgeon: Willis Modena, MD;  Location: [facility];  Service: Endoscopy;  Laterality: N/A;   . Pacemaker generator change  08/05/07    [employer]  . Pacemaker insertion  2003  . Appendectomy  1926  . Cholecystectomy  1956  . Tonsillectomy  1926   Social History:   reports that Patrick Murray has quit smoking. His smoking use included Pipe. Patrick Murray has never used smokeless tobacco. Patrick Murray reports that Patrick Murray does not drink alcohol or use illicit drugs.  Family History  Problem Relation Age of Onset  . Heart attack Mother     had a couple  . Heart failure Mother   . Emphysema Father   . Cancer Sister     Medications: Patient's Medications  New Prescriptions   No medications on file  Previous Medications   CHOLECALCIFEROL (VITAMIN D) 2000 UNITS TABLET    Take 2,000 Units by mouth daily.   GALANTAMINE (RAZADYNE) 8 MG TABLET    Take 1 tablet (8 mg total) by mouth 2 (two) times daily.   LOSARTAN (COZAAR) 25 MG TABLET    TAKE 1 TABLET DAILY FOR BLOOD PRESSURE.   PANTOPRAZOLE (PROTONIX) 40 MG TABLET    One daily to reduce stomach acid   SERTRALINE (ZOLOFT) 50 MG TABLET    Take 50 mg by  mouth daily.   Modified Medications   No medications on file  Discontinued Medications   No medications on file     Physical Exam: Physical Exam  Constitutional: Patrick Murray appears well-developed and well-nourished.  Frail elderly male  HENT:  Head: Normocephalic and atraumatic.  Right Ear: Tympanic membrane and ear canal normal.  Left Ear: Tympanic membrane and ear canal normal.  Eyes: Conjunctivae are normal. Pupils are equal, round, and reactive to light.  Neck: Normal range of motion. Neck supple. No JVD present. No tracheal deviation present. No thyromegaly present.  Cardiovascular: Normal rate and intact distal pulses.  Exam reveals no gallop and no friction rub.   No murmur heard. Atrial fib  Pulmonary/Chest: Effort normal. No respiratory distress. Patrick Murray has no wheezes. Patrick Murray has no rales.  Bronchial rattle.  Abdominal: Soft. Bowel sounds are normal. Patrick Murray exhibits no distension and no mass. There is no tenderness.   Musculoskeletal: Normal range of motion. Patrick Murray exhibits no edema and no tenderness.  Poor balance. Ambulates with assistance of walker.   Lymphadenopathy:    Patrick Murray has no cervical adenopathy.  Neurological: Patrick Murray is alert. No cranial nerve deficit. Coordination abnormal.  Skin: Skin is warm and dry. No rash noted. No erythema. No pallor.  Healing left eyebrow laceration and the left periorbital contusion.   Psychiatric: Patrick Murray has a normal mood and affect.  Cheerful. Forgetful.     Labs reviewed:  CBC:  Recent Labs  10/10/11 0455 10/11/11 0500 10/12/11 0510  WBC 7.1 7.2 9.3  HGB 8.6* 9.2* 8.6*  HCT 24.4* 26.4* 24.9*  MCV 89.1 88.9 88.3  PLT 123* 144* 142*   Past Procedures: 09/01/12 CT head and cervical spine:  IMPRESSION:  CT HEAD IMPRESSION  1. No acute intracranial findings. Left frontal forehead scalp  hematoma and laceration.  2. Periventricular white matter and corona radiata hypodensities  favor chronic ischemic microvascular white matter disease.  3. Chronic bony growth in the left maxillary sinus, unchanged from  2013, potentially from osteoma or fibrous dysplasia.  CT CERVICAL SPINE IMPRESSION  1. Prominent cervical spondylosis and degenerative disc disease,  without fracture or acute subluxation observed.    Assessment/Plan Type II or unspecified type diabetes mellitus with neurological manifestations, not stated as uncontrolled(250.60) Off all blood sugar medications. Fasting CBG average 120-140s, continue diet control.       HYPERTENSION Well controlled on Losartan.       Dysphagia, pharyngoesophageal phase Diet modification. Recent swallow evaluation: Dysphagia, pharyngoesophageal phase: avoid dry crumbly food. Take small bitesation.   Gait instability Patient stumbled over the carpet in his own apartment. Patrick Murray had an abrasion of the left face and a cut over the left eyebrow. This required 2 stitches in the emergency room 04/05/12. Additional tests done at  that time included a CT of the head and CT of the cervical spine. Chronic brain changes and arthritis of the neck were seen.   Larey Seat 09/01/12, sustained left periorbital contusion, left eyebrow laceration with 3 sutures closures-healed and sutures removed today w/o difficulty. CT head/spine 09/01/12  CT HEAD: IMPRESSION  1. No acute intracranial findings. Left frontal forehead scalp hematoma and laceration. 2. Periventricular white matter and corona radiat hypodensities favor chronic ischemic microvascular white matter disease. 3. Chronic bony growth in the left maxillary sinus, unchanged from 2013, potentially from osteoma or fibrous dysplasia. CT CERVICAL SPINE IMPRESSION 1. Prominent cervical spondylosis and degenerative disc disease, without fracture or acutesubluxation observed.      Dementia Dementia is progressing. Son  is aware of this. Family has arranged for [employer] to give part time help in the apartment released a couple of hours per day 3 times per week. Patient scored poorly on his MMSE with a total score of 21/30. Patrick Murray failed the clock drawing test. Patrick Murray is lack of safety awareness and needs verbal cue for many things including walker.     Facial contusion Left periorbital-no change in vision, ear canal exam showed no s/o bleeding or bruise. Should heal.     Family/ Staff Communication: observe the patient.   Goals of Care: [place]  Labs/tests ordered: none

## 2012-09-07 NOTE — Assessment & Plan Note (Addendum)
Diet modification. Recent swallow evaluation: Dysphagia, pharyngoesophageal phase: avoid dry crumbly food. Take small bitesation.

## 2012-09-07 NOTE — Assessment & Plan Note (Signed)
Off all blood sugar medications. Fasting CBG average 120-140s, continue diet control.

## 2012-09-07 NOTE — Assessment & Plan Note (Signed)
Left periorbital-no change in vision, ear canal exam showed no s/o bleeding or bruise. Should heal.

## 2012-09-07 NOTE — ED Provider Notes (Signed)
Shared service with midlevel provider. I have personally seen and examined the patient, providing direct face to face care, presenting with the chief complaint of fall. Physical exam findings include laceration to the face, otherwise no gross trauma.  Plan will be ambulate, image appropriately, and discharge if normal on both.. I have reviewed the nursing documentation on past medical history, family history, and social history.   Derwood Kaplan, MD 09/07/12 [phone]

## 2012-09-07 NOTE — Assessment & Plan Note (Signed)
Well-controlled on Losartan.

## 2012-09-07 NOTE — Assessment & Plan Note (Signed)
Patient stumbled over the carpet in his own apartment. He had an abrasion of the left face and a cut over the left eyebrow. This required 2 stitches in the emergency room 04/05/12. Additional tests done at that time included a CT of the head and CT of the cervical spine. Chronic brain changes and arthritis of the neck were seen.   Larey Seat 09/01/12, sustained left periorbital contusion, left eyebrow laceration with 3 sutures closures-healed and sutures removed today w/o difficulty. CT head/spine 09/01/12  CT HEAD: IMPRESSION  1. No acute intracranial findings. Left frontal forehead scalp hematoma and laceration. 2. Periventricular white matter and corona radiat hypodensities favor chronic ischemic microvascular white matter disease. 3. Chronic bony growth in the left maxillary sinus, unchanged from 2013, potentially from osteoma or fibrous dysplasia. CT CERVICAL SPINE IMPRESSION 1. Prominent cervical spondylosis and degenerative disc disease, without fracture or acutesubluxation observed.

## 2012-09-07 NOTE — Assessment & Plan Note (Signed)
Dementia is progressing. Son is aware of this. Family has arranged for [employer] to give part time help in the apartment released a couple of hours per day 3 times per week. Patient scored poorly on his MMSE with a total score of 21/30. He failed the clock drawing test. He is lack of safety awareness and needs verbal cue for many things including walker.

## 2012-10-12 ENCOUNTER — Ambulatory Visit (INDEPENDENT_AMBULATORY_CARE_PROVIDER_SITE_OTHER): Payer: Medicare Other | Admitting: Cardiovascular Disease

## 2012-10-12 ENCOUNTER — Other Ambulatory Visit: Payer: Self-pay | Admitting: Cardiovascular Disease

## 2012-10-12 ENCOUNTER — Encounter: Payer: Self-pay | Admitting: Cardiovascular Disease

## 2012-10-12 VITALS — BP 158/76 | HR 84 | Ht 69.0 in | Wt 159.4 lb

## 2012-10-12 DIAGNOSIS — I4891 Unspecified atrial fibrillation: Secondary | ICD-10-CM

## 2012-10-12 DIAGNOSIS — Z950 Presence of cardiac pacemaker: Secondary | ICD-10-CM

## 2012-10-12 DIAGNOSIS — E11621 Type 2 diabetes mellitus with foot ulcer: Secondary | ICD-10-CM

## 2012-10-12 DIAGNOSIS — E1169 Type 2 diabetes mellitus with other specified complication: Secondary | ICD-10-CM

## 2012-10-12 DIAGNOSIS — L97509 Non-pressure chronic ulcer of other part of unspecified foot with unspecified severity: Secondary | ICD-10-CM

## 2012-10-12 LAB — PACEMAKER DEVICE OBSERVATION
BATTERY VOLTAGE: 2.79 V
BMOD-0002RV: 10
BRDY-0004RV: 110 {beats}/min
DEVICE MODEL PM: 2094981
VENTRICULAR PACING PM: 24

## 2012-10-12 NOTE — Patient Instructions (Addendum)
Your physician recommends that you schedule a follow-up appointment in: 6 months  

## 2012-10-13 DIAGNOSIS — E11621 Type 2 diabetes mellitus with foot ulcer: Secondary | ICD-10-CM | POA: Insufficient documentation

## 2012-10-13 NOTE — Assessment & Plan Note (Signed)
Not a candidate for anticoagulation - unsteady gait and prone to injury.

## 2012-10-13 NOTE — Progress Notes (Signed)
Patient ID: Patrick Murray, male   DOB: 07/02/1917, 79 y.o.   MRN: [id number]      Reason for office visit atrial fibrillation, pacemaker  [name] has not had any serious cardiovascular problems since last seen in this office. He remains unsteady and has fallen. He denies dyspnea, dizziness or syncope and is unaware of palpitations. He has a dual chamber pacemaker implanted for SSS in 2010, but has subsequently developed permanent AF and the device is programmed VVI. He is not on anticoagulants, but I believe he is receiving aspirin. He has DM and was on oral antidiabetics until recently, stopped secondary to good glycemic control with diet. He has a "sore" on his left foot for "a long time". It is sometimes very tender. Better since he changed shoes. No fever. His memory continues to deteriorate slowly.   Allergies  Allergen Reactions  . Aricept [Donepezil Hcl] Other (See Comments)    Unknown   . Simvastatin Other (See Comments)    Unknown     Current Outpatient Prescriptions  Medication Sig Dispense Refill  . Cholecalciferol (VITAMIN D) 2000 UNITS tablet Take 1,000 Units by mouth daily.       Marland Kitchen galantamine (RAZADYNE) 8 MG tablet Take 1 tablet (8 mg total) by mouth 2 (two) times daily.  180 tablet  0  . Glucosamine-Chondroitin (GLUCOSAMINE CHONDR COMPLEX PO) Take 1 capsule by mouth daily.      Marland Kitchen losartan (COZAAR) 25 MG tablet TAKE 1 TABLET DAILY FOR BLOOD PRESSURE.  90 tablet  3  . pantoprazole (PROTONIX) 40 MG tablet One daily to reduce stomach acid  30 tablet  3  . sertraline (ZOLOFT) 50 MG tablet Take 50 mg by mouth daily.        No current facility-administered medications for this visit.    Past Medical History  Diagnosis Date  . Cardiac pacemaker in situ     a.fib w/slow ventricular response  . Atrial fibrillation   . Myalgia and myositis, unspecified   . Unspecified essential hypertension   . Other and unspecified hyperlipidemia   . Type II or unspecified  type diabetes mellitus with renal manifestations, not stated as uncontrolled(250.40)   . Sinoatrial node dysfunction   . Acute posthemorrhagic anemia   . Depression   . Gastric ulcer   . Dementia   . Vitamin D deficiency   . Gait disorder   . Chronic kidney disease, stage II (mild)   . Acute gastric ulcer with hemorrhage, without mention of obstruction   . Other malaise and fatigue   . Edema   . Personal history of fall   . Hemorrhage of gastrointestinal tract, unspecified 10/13/11  . Muscle weakness (generalized)   . Anemia, unspecified   . Other specified disease of sebaceous glands     xerosis cutis  . Unspecified pruritic disorder   . Unspecified urinary incontinence 01/01/10  . Unspecified hearing loss   . Peripheral vascular disease, unspecified   . Syncope and collapse 08/08/2008  . Abrasion of face   . Cough     Past Surgical History  Procedure Laterality Date  . No past surgeries    . Esophagogastroduodenoscopy  10/08/2011    Procedure: ESOPHAGOGASTRODUODENOSCOPY (EGD);  Surgeon: Willis Modena, MD;  Location: [facility];  Service: Endoscopy;  Laterality: N/A;  . Pacemaker generator change  08/05/07    [employer]  . Pacemaker insertion  2003  . Appendectomy  1926  . Cholecystectomy  1956  . Tonsillectomy  1926  Family History  Problem Relation Age of Onset  . Heart attack Mother     had a couple  . Heart failure Mother   . Emphysema Father   . Cancer Sister     History   Social History  . Marital Status: Married    Spouse Name: N/A    Number of Children: N/A  . Years of Education: N/A   Occupational History  . Not on file.   Social History Main Topics  . Smoking status: Former Smoker -- 5 years    Types: Pipe  . Smokeless tobacco: Never Used  . Alcohol Use: No  . Drug Use: No  . Sexual Activity: No   Other Topics Concern  . Not on file   Social History Narrative   Retired. Walks 5 miles for exercise. Does not use ethanol.     Review of  systems: The patient specifically denies any chest pain at rest or with exertion, dyspnea at rest or with exertion, orthopnea, paroxysmal nocturnal dyspnea, syncope, palpitations, focal neurological deficits, intermittent claudication, lower extremity edema, unexplained weight gain, cough, hemoptysis or wheezing.  The patient also denies abdominal pain, nausea, vomiting, dysphagia, diarrhea, constipation, polyuria, polydipsia, dysuria, hematuria, frequency, urgency, abnormal bleeding or bruising, fever, chills, unexpected weight changes, mood swings, change in skin or hair texture, change in voice quality, auditory or visual problems, allergic reactions or rashes, new musculoskeletal complaints other than usual "aches and pains".   PHYSICAL EXAM BP 158/76  Pulse 84  Ht 5\' 9"  (1.753 m)  Wt 159 lb 6.4 oz (72.303 kg)  BMI 23.53 kg/m2  General: Alert, oriented x3, no distress Head: no evidence of trauma, PERRL, EOMI, no exophtalmos or lid lag, no myxedema, no xanthelasma; normal ears, nose and oropharynx Neck: normal jugular venous pulsations and no hepatojugular reflux; brisk carotid pulses without delay and no carotid bruits Chest: clear to auscultation, no signs of consolidation by percussion or palpation, normal fremitus, symmetrical and full respiratory excursions Cardiovascular: normal position and quality of the apical impulse, regular rhythm, normal first and second heart sounds, no murmurs, rubs or gallops Abdomen: no tenderness or distention, no masses by palpation, no abnormal pulsatility or arterial bruits, normal bowel sounds, no hepatosplenomegaly Extremities:  Discolored, slightly fluctuent dusky 2-3 cm area on the lateral aspect of the left foot at the proximal 5th metatarsal head no clubbing, cyanosis or edema; 2+ radial, ulnar and brachial pulses bilaterally; 2+ right femoral, posterior tibial and dorsalis pedis pulses; 2+ left femoral, posterior tibial and dorsalis pedis pulses;  no subclavian or femoral bruits Neurological: grossly nonfocal   EKG: atrial fibrillation, chronic T wave inversion inferiorly  Lipid Panel  No results found for this basename: chol, trig, hdl, cholhdl, vldl, ldlcalc    BMET    Component Value Date/Time   NA 137 10/12/2011 0510   K 3.6 10/12/2011 0510   CL 103 10/12/2011 0510   CO2 23 10/12/2011 0510   GLUCOSE 135* 10/12/2011 0510   BUN 38* 10/12/2011 0510   CREATININE 2.03* 10/12/2011 0510   CALCIUM 8.5 10/12/2011 0510   GFRNONAA 27* 10/12/2011 0510   GFRAA 31* 10/12/2011 0510     ASSESSMENT AND PLAN Cardiac pacemaker - [employer] Zephyr 2010 Dual chamber device programmed VVIR for permanent atrial fibrillation with slow ventricular response.  Roughly 25% V pacing (consistent with past history). Normal lead and battery function. Anticipated generator longevity of 10 years. No permanent programming changes.  Permanent atrial fibrillation Not a candidate for anticoagulation -  unsteady gait and prone to injury.  DM type 2 with diabetic foot ulcer His left foot has a pressure lesion at the head of the fifth metatarsal , on the lateral side. The area is not overtly infected, but there is a little fluctuence and the skin is dusky and thin. Needs pressure relief with appropriate shoes and close monitoring.  No orders of the defined types were placed in this encounter.   Meds ordered this encounter  Medications  . Glucosamine-Chondroitin (GLUCOSAMINE CHONDR COMPLEX PO)    Sig: Take 1 capsule by mouth daily.    Junious Silk, MD, [facility] [phone] office [phone] pager

## 2012-10-13 NOTE — Assessment & Plan Note (Signed)
His left foot has a pressure lesion at the head of the fifth metatarsal , on the lateral side. The area is not overtly infected, but there is a little fluctuence and the skin is dusky and thin. Needs pressure relief with appropriate shoes and close monitoring.

## 2012-10-13 NOTE — Assessment & Plan Note (Signed)
Dual chamber device programmed VVIR for permanent atrial fibrillation with slow ventricular response.  Roughly 25% V pacing (consistent with past history). Normal lead and battery function. Anticipated generator longevity of 10 years. No permanent programming changes.

## 2012-10-30 ENCOUNTER — Other Ambulatory Visit: Payer: Self-pay | Admitting: Internal Medicine

## 2012-11-01 ENCOUNTER — Other Ambulatory Visit: Payer: Self-pay | Admitting: Internal Medicine

## 2012-11-16 ENCOUNTER — Encounter: Payer: Self-pay | Admitting: Internal Medicine

## 2012-11-16 ENCOUNTER — Non-Acute Institutional Stay: Payer: Medicare Other | Admitting: Internal Medicine

## 2012-11-16 VITALS — BP 146/74 | HR 62 | Ht 69.0 in | Wt 161.0 lb

## 2012-11-16 DIAGNOSIS — I4891 Unspecified atrial fibrillation: Secondary | ICD-10-CM

## 2012-11-16 DIAGNOSIS — E1121 Type 2 diabetes mellitus with diabetic nephropathy: Secondary | ICD-10-CM | POA: Insufficient documentation

## 2012-11-16 DIAGNOSIS — R1314 Dysphagia, pharyngoesophageal phase: Secondary | ICD-10-CM

## 2012-11-16 DIAGNOSIS — I10 Essential (primary) hypertension: Secondary | ICD-10-CM

## 2012-11-16 DIAGNOSIS — E1149 Type 2 diabetes mellitus with other diabetic neurological complication: Secondary | ICD-10-CM

## 2012-11-16 DIAGNOSIS — L97509 Non-pressure chronic ulcer of other part of unspecified foot with unspecified severity: Secondary | ICD-10-CM

## 2012-11-16 DIAGNOSIS — L97529 Non-pressure chronic ulcer of other part of left foot with unspecified severity: Secondary | ICD-10-CM

## 2012-11-16 DIAGNOSIS — E11621 Type 2 diabetes mellitus with foot ulcer: Secondary | ICD-10-CM

## 2012-11-16 DIAGNOSIS — R2681 Unsteadiness on feet: Secondary | ICD-10-CM

## 2012-11-16 DIAGNOSIS — E785 Hyperlipidemia, unspecified: Secondary | ICD-10-CM

## 2012-11-16 DIAGNOSIS — F0390 Unspecified dementia without behavioral disturbance: Secondary | ICD-10-CM

## 2012-11-16 DIAGNOSIS — R269 Unspecified abnormalities of gait and mobility: Secondary | ICD-10-CM

## 2012-11-16 DIAGNOSIS — E1129 Type 2 diabetes mellitus with other diabetic kidney complication: Secondary | ICD-10-CM

## 2012-11-16 DIAGNOSIS — D649 Anemia, unspecified: Secondary | ICD-10-CM

## 2012-11-16 DIAGNOSIS — E1169 Type 2 diabetes mellitus with other specified complication: Secondary | ICD-10-CM

## 2012-11-16 DIAGNOSIS — N182 Chronic kidney disease, stage 2 (mild): Secondary | ICD-10-CM

## 2012-11-16 NOTE — Progress Notes (Signed)
Subjective:    Patient ID: Patrick Murray, male    DOB: 22-Feb-1917, 79 y.o.   MRN: [id number]  Chief Complaint  Patient presents with  . Medical Managment of Chronic Issues    blood sugar, blood pressure, dementia, gait.  Test blood sugar once a day before breakfast was 136 this morning.   . Wound Check    left foot, pressure lesion.                                          here with Care giver Velna Hatchet    HPI  HYPERTENSION: controlled  Permanent atrial fibrillation: unchanged. Rate controlled  Dysphagia, pharyngoesophageal phase: no weight loss. He thinks he is doing better.  Type II or unspecified type diabetes mellitus with neurological manifestations, not stated as uncontrolled(250.60); unchanged and controlled  Chronic kidney disease, stage II (mild): unchanged   Type II or unspecified type diabetes mellitus with renal manifestations, not stated as uncontrolled(250.40): unchanged and controlled  Gait instability: high risk for falls  HYPERLIPIDEMIA: not checked recently  DM type 2 with diabetic foot ulcer: pressure uler left 5ht MT complicated by DM  Anemia: improved. Hgb 12.4, MCV 87.7  Dementia: gradually worsening. Remains in his apt. Family wants to keep him in the apt. His son understands there will likely be a time when transfer to higher care level will be necessary.  Ulcer of left foot: painless and healing    Current Outpatient Prescriptions on File Prior to Visit  Medication Sig Dispense Refill  . Cholecalciferol (VITAMIN D) 2000 UNITS tablet Take 1,000 Units by mouth daily.       Marland Kitchen galantamine (RAZADYNE) 8 MG tablet TAKE 1 TABLET TWICE DAILY.  180 tablet  0  . Glucosamine-Chondroitin (GLUCOSAMINE CHONDR COMPLEX PO) Take 1 capsule by mouth daily.      Marland Kitchen losartan (COZAAR) 25 MG tablet TAKE 1 TABLET DAILY FOR BLOOD PRESSURE.  90 tablet  3  . pantoprazole (PROTONIX) 40 MG tablet One daily to reduce stomach acid  30 tablet  3  . sertraline (ZOLOFT) 50 MG  tablet TAKE ONE TABLET DAILY TO HELP WITH NERVES.  60 tablet  3   No current facility-administered medications on file prior to visit.    Review of Systems  Constitutional: Negative for fever, chills, diaphoresis, activity change, appetite change, fatigue and unexpected weight change.  HENT: Positive for hearing loss. Negative for congestion, ear pain, facial swelling and nosebleeds.   Eyes: Negative.        Corrective lenses.  Respiratory: Negative for cough, chest tightness and shortness of breath.   Cardiovascular: Negative for chest pain, palpitations and leg swelling.  Gastrointestinal: Negative for abdominal pain and abdominal distention.       Cough with eating and drinking.  Endocrine: Negative for cold intolerance, heat intolerance, polydipsia, polyphagia and polyuria.       Diabetic.  Genitourinary: Negative.   Musculoskeletal: Positive for arthralgias, gait problem and myalgias. Negative for neck pain and neck stiffness.  Skin:       Pressure ulcer of the 5th left MT proximally. Painless.  Neurological: Positive for dizziness and weakness. Negative for tremors, seizures, syncope, numbness and headaches.       Balance disturbance.  Hematological: Negative.   Psychiatric/Behavioral: Positive for confusion.       Objective:BP 146/74  Pulse 62  Ht 5\' 9"  (1.753 m)  Wt 161 lb (73.029 kg)  BMI 23.76 kg/m2    Physical Exam  Constitutional:  Frail elderly male  Eyes: Conjunctivae are normal. Pupils are equal, round, and reactive to light.  Neck: Normal range of motion. Neck supple. No JVD present. No tracheal deviation present. No thyromegaly present.  Cardiovascular: Normal rate and intact distal pulses.  Exam reveals no gallop and no friction rub.   No murmur heard. Atrial fib  Pulmonary/Chest: Effort normal. No respiratory distress. He has no wheezes. He has no rales.  Bronchial rattle.  Abdominal: Soft. Bowel sounds are normal. He exhibits no distension and no mass.  There is no tenderness.  Musculoskeletal: Normal range of motion. He exhibits no edema and no tenderness.  Poor balance.  Lymphadenopathy:    He has no cervical adenopathy.  Neurological: He is alert. No cranial nerve deficit. Coordination abnormal.  Skin: Skin is warm and dry. No rash noted. No erythema. No pallor.  Bruise of the left leg at kne and lateral to the knee. Scratched area of the right wrist ulnar side. Shallow ulcer f the left foot at the proximal 5th metatarsal head.   Psychiatric: He has a normal mood and affect.  Cheerful. Forgetful.          Assessment & Plan:  1. HYPERTENSION controlled  2. Permanent atrial fibrillation Rate controlled  3. Dysphagia, pharyngoesophageal phase improved  4. Type II or unspecified type diabetes mellitus with neurological manifestations, not stated as uncontrolled(250.60) controlled  5. Chronic kidney disease, stage II (mild) unchanged  6. Type II or unspecified type diabetes mellitus with renal manifestations, not stated as uncontrolled(250.40) controlled  7. Gait instability Recommended walker. At risk for falls  8. HYPERLIPIDEMIA Recheck next visit  9. 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 Anemia improved  11. Dementia progessing  12. Ulcer of left foot No infection. Continue home care and daily bandage change.

## 2013-01-10 ENCOUNTER — Telehealth: Payer: Self-pay

## 2013-01-10 NOTE — Telephone Encounter (Signed)
Received phone call that [name] has appointment today for hearing test at [facility] and needs referral. Phone [phone], fax [phone]. Called [facility] they need written referral for Medicare. Fax referral.

## 2013-01-22 ENCOUNTER — Other Ambulatory Visit: Payer: Self-pay | Admitting: Internal Medicine

## 2013-02-24 ENCOUNTER — Encounter: Payer: Self-pay | Admitting: Internal Medicine

## 2013-02-24 DIAGNOSIS — L97529 Non-pressure chronic ulcer of other part of left foot with unspecified severity: Secondary | ICD-10-CM | POA: Insufficient documentation

## 2013-03-10 LAB — BASIC METABOLIC PANEL
BUN: 45 mg/dL — AB (ref 4–21)
Creatinine: 2.7 mg/dL — AB (ref <=1.3)
Glucose: 112 mg/dL
POTASSIUM: 4.6 mmol/L (ref 3.4–5.3)
SODIUM: 139 mmol/L (ref 137–147)

## 2013-03-10 LAB — LIPID PANEL
CHOLESTEROL: 157 mg/dL (ref 0–200)
HDL: 43 mg/dL (ref 35–70)
LDL CALC: 95 mg/dL

## 2013-03-10 LAB — HEPATIC FUNCTION PANEL
ALT: 10 U/L (ref 10–40)
AST: 13 U/L — AB (ref 14–40)
Alkaline Phosphatase: 107 U/L (ref 25–125)
BILIRUBIN, TOTAL: 6.4 mg/dL

## 2013-03-10 LAB — HEMOGLOBIN A1C: Hgb A1c MFr Bld: 6.2 % — AB (ref 4.0–6.0)

## 2013-03-10 LAB — CBC AND DIFFERENTIAL
HEMATOCRIT: 36 % — AB (ref 41–53)
HEMOGLOBIN: 12 g/dL — AB (ref 13.5–17.5)
Platelets: 128 10*3/uL — AB (ref 150–399)
WBC: 6.4 10^3/mL

## 2013-03-11 ENCOUNTER — Encounter: Payer: Self-pay | Admitting: Internal Medicine

## 2013-03-11 LAB — LIPID PANEL: TRIGLYCERIDES: 93 mg/dL (ref 40–160)

## 2013-03-15 ENCOUNTER — Encounter: Payer: Self-pay | Admitting: Internal Medicine

## 2013-03-15 ENCOUNTER — Non-Acute Institutional Stay: Payer: Medicare Other | Admitting: Internal Medicine

## 2013-03-15 VITALS — BP 142/78 | HR 72 | Wt 161.0 lb

## 2013-03-15 DIAGNOSIS — L97529 Non-pressure chronic ulcer of other part of left foot with unspecified severity: Secondary | ICD-10-CM

## 2013-03-15 DIAGNOSIS — L97509 Non-pressure chronic ulcer of other part of unspecified foot with unspecified severity: Secondary | ICD-10-CM

## 2013-03-15 DIAGNOSIS — F0390 Unspecified dementia without behavioral disturbance: Secondary | ICD-10-CM

## 2013-03-15 DIAGNOSIS — E11621 Type 2 diabetes mellitus with foot ulcer: Secondary | ICD-10-CM

## 2013-03-15 DIAGNOSIS — R2681 Unsteadiness on feet: Secondary | ICD-10-CM

## 2013-03-15 DIAGNOSIS — R269 Unspecified abnormalities of gait and mobility: Secondary | ICD-10-CM

## 2013-03-15 DIAGNOSIS — E1149 Type 2 diabetes mellitus with other diabetic neurological complication: Secondary | ICD-10-CM

## 2013-03-15 DIAGNOSIS — E1169 Type 2 diabetes mellitus with other specified complication: Secondary | ICD-10-CM

## 2013-03-15 DIAGNOSIS — D649 Anemia, unspecified: Secondary | ICD-10-CM

## 2013-03-15 DIAGNOSIS — N182 Chronic kidney disease, stage 2 (mild): Secondary | ICD-10-CM

## 2013-03-15 NOTE — Progress Notes (Signed)
Patient ID: Patrick Murray, male   DOB: 1917-06-28, 80 y.o.   MRN: [id number]    Location:  [facility]   Place of Service: Clinic (12)    Allergies  Allergen Reactions  . Aricept [Donepezil Hcl] Other (See Comments)    Unknown   . Simvastatin Other (See Comments)    Unknown     Chief Complaint  Patient presents with  . Medical Managment of Chronic Issues    blood sugar, blood pressure, dementia, A-Fib, CKD. Check left foot wound    HPI:  Type II or unspecified type diabetes mellitus with neurological manifestations, not stated as uncontrolled(250.60): controlled  DM type 2 with diabetic foot ulcer: controlled  Chronic kidney disease, stage II (mild): moderately increased BUN and creatinine  Gait instability: unchanged. No recent falls  Anemia: Hgb  12.0, MCV 91.6  Ulcer of left foot: Healed with residual scaly rea of the left 5th MT proximal end. No ulcer.  Dementia: unchanged    Medications: Patient's Medications  New Prescriptions   No medications on file  Previous Medications   CHOLECALCIFEROL (VITAMIN D) 2000 UNITS TABLET    Take 1,000 Units by mouth daily.    FEEDING SUPPLEMENT, GLUCERNA SHAKE, (GLUCERNA SHAKE) LIQD    Take 237 mLs by mouth. Drink once a day   GALANTAMINE (RAZADYNE) 8 MG TABLET    TAKE 1 TABLET TWICE DAILY.   GLUCOSAMINE-CHONDROITIN (GLUCOSAMINE CHONDR COMPLEX PO)    Take 1 capsule by mouth daily.   LOSARTAN (COZAAR) 25 MG TABLET    TAKE 1 TABLET DAILY FOR BLOOD PRESSURE.   PANTOPRAZOLE (PROTONIX) 40 MG TABLET    One daily to reduce stomach acid   SERTRALINE (ZOLOFT) 50 MG TABLET    TAKE ONE TABLET DAILY TO HELP WITH NERVES.  Modified Medications   No medications on file  Discontinued Medications   No medications on file     Review of Systems  Constitutional: Negative for fever, chills, diaphoresis, activity change, appetite change, fatigue and unexpected weight change.  HENT: Positive for hearing loss. Negative for  congestion, ear pain, facial swelling and nosebleeds.   Eyes: Negative.        Corrective lenses.  Respiratory: Negative for cough, chest tightness and shortness of breath.   Cardiovascular: Negative for chest pain, palpitations and leg swelling.  Gastrointestinal: Negative for abdominal pain and abdominal distention.       Cough with eating and drinking.  Endocrine: Negative for cold intolerance, heat intolerance, polydipsia, polyphagia and polyuria.       Diabetic.  Genitourinary: Negative.   Musculoskeletal: Positive for arthralgias, gait problem and myalgias. Negative for neck pain and neck stiffness.  Skin:       Healed pressure ulcer of the 5th left MT proximally. Residual scaly area.  Neurological: Positive for dizziness and weakness. Negative for tremors, seizures, syncope, numbness and headaches.       Balance disturbance.  Hematological: Negative.   Psychiatric/Behavioral: Positive for confusion.    Filed Vitals:   03/15/13 0906  BP: 142/78  Pulse: 72  Weight: 161 lb (73.029 kg)   Physical Exam  Constitutional:  Frail elderly male  Eyes: Conjunctivae are normal. Pupils are equal, round, and reactive to light.  Neck: Normal range of motion. Neck supple. No JVD present. No tracheal deviation present. No thyromegaly present.  Cardiovascular: Normal rate and intact distal pulses.  Exam reveals no gallop and no friction rub.   No murmur heard. Atrial fib  Pulmonary/Chest: Effort  normal. No respiratory distress. He has no wheezes. He has no rales.  Bronchial rattle.  Abdominal: Soft. Bowel sounds are normal. He exhibits no distension and no mass. There is no tenderness.  Musculoskeletal: Normal range of motion. He exhibits no edema and no tenderness.  Poor balance.  Lymphadenopathy:    He has no cervical adenopathy.  Neurological: He is alert. No cranial nerve deficit. Coordination abnormal.  Skin: Skin is warm and dry. No rash noted. No erythema. No pallor.  Healed  ulcer of the left foot at the proximal 5th metatarsal head. Residual scaly area.  Psychiatric: He has a normal mood and affect.  Cheerful. Forgetful.     Labs reviewed: Nursing Home on 03/15/2013  Component Date Value Ref Range Status  . Hemoglobin 03/10/2013 12.0* 13.5 - 17.5 g/dL Final  . HCT 57/84/696203/05/2013 36* 41 - 53 % Final  . Platelets 03/10/2013 128* 150 - 399 K/L Final  . WBC 03/10/2013 6.4   Final  . Glucose 03/10/2013 112   Final  . BUN 03/10/2013 45* 4 - 21 mg/dL Final  . Creatinine 95/28/413203/05/2013 2.7* .6 - 1.3 mg/dL Final  . Potassium 44/01/027203/05/2013 4.6  3.4 - 5.3 mmol/L Final  . Sodium 03/10/2013 139  137 - 147 mmol/L Final  . Cholesterol 03/10/2013 157  0 - 200 mg/dL Final  . HDL 53/66/440303/05/2013 43  35 - 70 mg/dL Final  . LDL Cholesterol 03/10/2013 95   Final  . Triglycerides 03/11/2013 93  40 - 160 mg/dL Final  . Alkaline Phosphatase 03/10/2013 107  25 - 125 U/L Final  . ALT 03/10/2013 10  10 - 40 U/L Final  . AST 03/10/2013 13* 14 - 40 U/L Final  . Bilirubin, Total 03/10/2013 6.4   Final  . Hemoglobin A1C 03/10/2013 6.2* 4.0 - 6.0 % Final      Assessment/Plan  1. Type II or unspecified type diabetes mellitus with neurological manifestations, not stated as uncontrolled(250.60) controlled  2. DM type 2 with diabetic foot ulcer Controlled and foot ulcer is healed  3. Chronic kidney disease, stage II (mild) unchanged  4. Gait instability Chronic. High risk for falls  5. Anemia stable  6. Ulcer of left foot healed  7. Dementia unchanged

## 2013-03-30 ENCOUNTER — Encounter: Payer: Self-pay | Admitting: Internal Medicine

## 2013-04-12 ENCOUNTER — Encounter: Payer: Self-pay | Admitting: Internal Medicine

## 2013-04-15 ENCOUNTER — Other Ambulatory Visit: Payer: Self-pay | Admitting: Internal Medicine

## 2013-04-19 ENCOUNTER — Non-Acute Institutional Stay: Payer: Medicare Other | Admitting: Internal Medicine

## 2013-04-19 ENCOUNTER — Encounter: Payer: Self-pay | Admitting: Internal Medicine

## 2013-04-19 ENCOUNTER — Telehealth: Payer: Self-pay | Admitting: *Deleted

## 2013-04-19 VITALS — BP 130/62 | HR 80 | Temp 97.7°F | Wt 158.0 lb

## 2013-04-19 DIAGNOSIS — L299 Pruritus, unspecified: Secondary | ICD-10-CM

## 2013-04-19 DIAGNOSIS — B349 Viral infection, unspecified: Secondary | ICD-10-CM

## 2013-04-19 DIAGNOSIS — F0390 Unspecified dementia without behavioral disturbance: Secondary | ICD-10-CM

## 2013-04-19 DIAGNOSIS — I10 Essential (primary) hypertension: Secondary | ICD-10-CM

## 2013-04-19 DIAGNOSIS — B9789 Other viral agents as the cause of diseases classified elsewhere: Secondary | ICD-10-CM

## 2013-04-19 DIAGNOSIS — E1149 Type 2 diabetes mellitus with other diabetic neurological complication: Secondary | ICD-10-CM

## 2013-04-19 MED ORDER — DIPHENHYDRAMINE HCL 25 MG PO TABS: ORAL_TABLET | ORAL | Status: DC

## 2013-04-19 MED ORDER — DEXTROMETHORPHAN POLISTIREX 30 MG/5ML PO LQCR: 60.0000 mg | ORAL | Status: DC | PRN

## 2013-04-19 NOTE — Progress Notes (Signed)
Patient ID: Patrick Murray, male   DOB: 08/28/1917, 80 y.o.   MRN: [id number]    Location:  [facility]  Place of Service: CLINIC    Allergies  Allergen Reactions  . Aricept [Donepezil Hcl] Other (See Comments)    Unknown   . Simvastatin Other (See Comments)    Unknown     Chief Complaint  Patient presents with  . chest congestion    coughing, had runny nose, Thursday was out of it. Temp 99.8    HPI:  Acute viral syndrome: low grade fever. Increase in confusion. Incontinence of urine. Increased cough. Bronchial rattle. No sputum.   HYPERTENSION: controlled  Type II or unspecified type diabetes mellitus with neurological manifestations, not stated as uncontrolled(250.60): controlled  Dementia: progressing. Recent increase in confusion consistent with toxic encephalopathy associated with acute illness.  Pruritus: increased complaints of itching. No rash. Has dry skin.    Medications: Patient's Medications  New Prescriptions   No medications on file  Previous Medications   CHOLECALCIFEROL (VITAMIN D) 2000 UNITS TABLET    Take 1,000 Units by mouth daily.    FEEDING SUPPLEMENT, GLUCERNA SHAKE, (GLUCERNA SHAKE) LIQD    Take 237 mLs by mouth. Drink once a day   GALANTAMINE (RAZADYNE) 8 MG TABLET    TAKE 1 TABLET TWICE DAILY.   GLUCOSAMINE-CHONDROITIN (GLUCOSAMINE CHONDR COMPLEX PO)    Take 1 capsule by mouth daily.   LOSARTAN (COZAAR) 25 MG TABLET    TAKE 1 TABLET DAILY FOR BLOOD PRESSURE.   PANTOPRAZOLE (PROTONIX) 40 MG TABLET    TAKE 1 TABLET DAILY TO REDUCE STOMACH ACID.   SERTRALINE (ZOLOFT) 50 MG TABLET    TAKE ONE TABLET DAILY TO HELP WITH NERVES.  Modified Medications   No medications on file  Discontinued Medications   No medications on file     Review of Systems  Constitutional: Positive for fever and fatigue. Negative for chills, diaphoresis, activity change, appetite change and unexpected weight change.  HENT: Positive for hearing loss. Negative for  congestion, ear pain, facial swelling and nosebleeds.   Eyes: Negative.        Corrective lenses.  Respiratory: Negative for chest tightness and shortness of breath. Cough: dry.   Cardiovascular: Negative for chest pain, palpitations and leg swelling.  Gastrointestinal: Negative for abdominal pain and abdominal distention.       Cough with eating and drinking.  Endocrine: Negative for cold intolerance, heat intolerance, polydipsia, polyphagia and polyuria.       Diabetic.  Genitourinary: Negative.   Musculoskeletal: Positive for arthralgias, gait problem and myalgias. Negative for neck pain and neck stiffness.  Skin:       Healed pressure ulcer of the 5th left MT proximally. Residual scaly area.  Neurological: Positive for dizziness and weakness. Negative for tremors, seizures, syncope, numbness and headaches.       Balance disturbance. Dementia.  Hematological: Negative.   Psychiatric/Behavioral: Positive for behavioral problems and confusion.    Filed Vitals:   04/19/13 0902  BP: 130/62  Pulse: 80  Temp: 97.7 F (36.5 C)  TempSrc: Oral  Weight: 158 lb (71.668 kg)   Physical Exam  Constitutional:  Frail elderly male  Eyes: Conjunctivae are normal. Pupils are equal, round, and reactive to light.  Neck: Normal range of motion. Neck supple. No JVD present. No tracheal deviation present. No thyromegaly present.  Cardiovascular: Normal rate and intact distal pulses.  Exam reveals no gallop and no friction rub.   No murmur heard. Atrial  fib  Pulmonary/Chest: Effort normal. No respiratory distress. He has no wheezes. He has no rales.  Bronchial rattle.  Abdominal: Soft. Bowel sounds are normal. He exhibits no distension and no mass. There is no tenderness.  Musculoskeletal: Normal range of motion. He exhibits no edema and no tenderness.  Poor balance.  Lymphadenopathy:    He has no cervical adenopathy.  Neurological: He is alert. No cranial nerve deficit. Coordination abnormal.    Skin: Skin is warm and dry. No rash noted. No erythema. No pallor.  Healed ulcer of the left foot at the proximal 5th metatarsal head. Residual scaly area.  Psychiatric: He has a normal mood and affect.  Cheerful. Forgetful.     Labs reviewed: Nursing Home on 03/15/2013  Component Date Value Ref Range Status  . Hemoglobin 03/10/2013 12.0* 13.5 - 17.5 g/dL Final  . HCT 65/78/469603/05/2013 36* 41 - 53 % Final  . Platelets 03/10/2013 128* 150 - 399 K/L Final  . WBC 03/10/2013 6.4   Final  . Glucose 03/10/2013 112   Final  . BUN 03/10/2013 45* 4 - 21 mg/dL Final  . Creatinine 29/52/841303/05/2013 2.7* .6 - 1.3 mg/dL Final  . Potassium 24/40/102703/05/2013 4.6  3.4 - 5.3 mmol/L Final  . Sodium 03/10/2013 139  137 - 147 mmol/L Final  . Cholesterol 03/10/2013 157  0 - 200 mg/dL Final  . HDL 25/36/644003/05/2013 43  35 - 70 mg/dL Final  . LDL Cholesterol 03/10/2013 95   Final  . Triglycerides 03/11/2013 93  40 - 160 mg/dL Final  . Alkaline Phosphatase 03/10/2013 107  25 - 125 U/L Final  . ALT 03/10/2013 10  10 - 40 U/L Final  . AST 03/10/2013 13* 14 - 40 U/L Final  . Bilirubin, Total 03/10/2013 6.4   Final  . Hemoglobin A1C 03/10/2013 6.2* 4.0 - 6.0 % Final      Assessment/Plan  Acute viral syndrome -seems to be improving over the last 48 hours  Plan: dextromethorphan (DELSYM) 30 MG/5ML liquid, CBC With differential/Platelet, Comprehensive metabolic panel  HYPERTENSION: controlled  Type II or unspecified type diabetes mellitus with neurological manifestations, not stated as uncontrolled(250.60); controlled  Dementia: slowly progressing. Completely dependent on caretaker to allow him to live in the independent section  Pruritus - Plan: diphenhydrAMINE (BENADRYL) 25 MG tablet q6h prn itching

## 2013-04-19 NOTE — Telephone Encounter (Signed)
Patient son called and stated that he needed a letter stating that patient needs assistance with daily care with all daily activities. Son stated that he also sent you a detailed E-Mail. Please Advise.

## 2013-04-21 LAB — BASIC METABOLIC PANEL
BUN: 41 mg/dL — AB (ref 4–21)
Creatinine: 2.4 mg/dL — AB (ref 0.6–1.3)
GLUCOSE: 122 mg/dL
Potassium: 4.3 mmol/L (ref 3.4–5.3)
Sodium: 139 mmol/L (ref 137–147)

## 2013-04-21 LAB — CBC AND DIFFERENTIAL
HCT: 90 % — AB (ref 41–53)
Hemoglobin: 12.2 g/dL — AB (ref 13.5–17.5)
NEUTROS ABS: 5 /uL
Platelets: 151 10*3/uL (ref 150–399)
WBC: 6.6 10^3/mL

## 2013-04-21 LAB — HEPATIC FUNCTION PANEL
ALT: 8 U/L — AB (ref 10–40)
AST: 12 U/L — AB (ref 14–40)
Alkaline Phosphatase: 102 U/L (ref 25–125)
BILIRUBIN, TOTAL: 0.7 mg/dL

## 2013-04-22 ENCOUNTER — Encounter: Payer: Self-pay | Admitting: Cardiovascular Disease

## 2013-04-22 ENCOUNTER — Ambulatory Visit (INDEPENDENT_AMBULATORY_CARE_PROVIDER_SITE_OTHER): Payer: Medicare Other | Admitting: Cardiovascular Disease

## 2013-04-22 VITALS — BP 128/68 | HR 75 | Ht 68.0 in | Wt 158.6 lb

## 2013-04-22 DIAGNOSIS — I4891 Unspecified atrial fibrillation: Secondary | ICD-10-CM

## 2013-04-22 LAB — PACEMAKER DEVICE OBSERVATION

## 2013-04-22 NOTE — Patient Instructions (Signed)
Your physician recommends that you schedule a follow-up appointment in: 6 months with Dr.Croitoru    

## 2013-04-24 NOTE — Progress Notes (Signed)
Patient ID: Patrick Murray, male   DOB: 06/03/1917, 80 y.o.   MRN: [id number]      Reason for office visit Atrial fibrillation with slow ventricular response, pacemaker followup  There has not been any significant change in his health since his last appointment in October. He remains very unsteady on his feet and has fallen several more times. He is always in good spirits. He denies dyspnea, dizziness, syncope, palpitations or chest discomfort. He has a dual-chamber permanent pacemaker that has been programmed VVI R as he is now in permanent atrial fibrillation with slow ventricular response. He is unable to take anticoagulants due to his frequent falls. He has diet-controlled diabetes mellitus. The sore on his left foot is healing slowly.  Allergies  Allergen Reactions  . Aricept [Donepezil Hcl] Other (See Comments)    Unknown   . Simvastatin Other (See Comments)    Unknown     Current Outpatient Prescriptions  Medication Sig Dispense Refill  . Cholecalciferol (VITAMIN D) 2000 UNITS tablet Take 1,000 Units by mouth daily.       Marland Kitchen. dextromethorphan (DELSYM) 30 MG/5ML liquid Take 10 mLs (60 mg total) by mouth as needed for cough.  90 mL  3  . diphenhydrAMINE (BENADRYL) 25 MG tablet One every 6 hours if needed for itching  30 tablet  0  . feeding supplement, GLUCERNA SHAKE, (GLUCERNA SHAKE) LIQD Take 237 mLs by mouth. Drink once a day      . galantamine (RAZADYNE) 8 MG tablet TAKE 1 TABLET TWICE DAILY.  180 tablet  1  . Glucosamine-Chondroitin (GLUCOSAMINE CHONDR COMPLEX PO) Take 1 capsule by mouth daily.      Marland Kitchen. losartan (COZAAR) 25 MG tablet TAKE 1 TABLET DAILY FOR BLOOD PRESSURE.  90 tablet  1  . pantoprazole (PROTONIX) 40 MG tablet TAKE 1 TABLET DAILY TO REDUCE STOMACH ACID.  90 tablet  1  . sertraline (ZOLOFT) 50 MG tablet TAKE ONE TABLET DAILY TO HELP WITH NERVES.  60 tablet  3   No current facility-administered medications for this visit.    Past Medical History  Diagnosis  Date  . Cardiac pacemaker in situ     a.fib w/slow ventricular response  . Atrial fibrillation   . Myalgia and myositis, unspecified   . Unspecified essential hypertension   . Other and unspecified hyperlipidemia   . Type II or unspecified type diabetes mellitus with renal manifestations, not stated as uncontrolled   . Sinoatrial node dysfunction   . Acute posthemorrhagic anemia   . Depression   . Gastric ulcer   . Dementia   . Vitamin D deficiency   . Gait disorder   . Chronic kidney disease, stage II (mild)   . Acute gastric ulcer with hemorrhage, without mention of obstruction   . Other malaise and fatigue   . Edema   . Personal history of fall   . Hemorrhage of gastrointestinal tract, unspecified 10/13/11  . Muscle weakness (generalized)   . Anemia, unspecified   . Other specified disease of sebaceous glands     xerosis cutis  . Unspecified pruritic disorder   . Unspecified urinary incontinence 01/01/10  . Unspecified hearing loss   . Peripheral vascular disease, unspecified   . Syncope and collapse 08/08/2008  . Abrasion of face   . Cough     Past Surgical History  Procedure Laterality Date  . No past surgeries    . Esophagogastroduodenoscopy  10/08/2011    Procedure: ESOPHAGOGASTRODUODENOSCOPY (EGD);  Surgeon: Chrissie NoaWilliam  Dulce Sellar, MD;  Location: [facility];  Service: Endoscopy;  Laterality: N/A;  . Pacemaker generator change  08/05/07    [employer]  . Pacemaker insertion  2003  . Appendectomy  1926  . Cholecystectomy  1956  . Tonsillectomy  1926    Family History  Problem Relation Age of Onset  . Heart attack Mother     had a couple  . Heart failure Mother   . Emphysema Father   . Cancer Sister     lung    History   Social History  . Marital Status: Married    Spouse Name: N/A    Number of Children: N/A  . Years of Education: N/A   Occupational History  . retired     Social History Main Topics  . Smoking status: Former Smoker -- 5 years    Types: Pipe    . Smokeless tobacco: Never Used  . Alcohol Use: No  . Drug Use: No  . Sexual Activity: No   Other Topics Concern  . Not on file   Social History Narrative   Retired. Walks 5 miles for exercise. Does not use ethanol.    Lives at [facility]    Was in [place] WWII   Has Pacemaker   Living Will    Review of systems: The patient specifically denies any chest pain at rest or with exertion, dyspnea at rest or with exertion, orthopnea, paroxysmal nocturnal dyspnea, syncope, palpitations, focal neurological deficits, intermittent claudication, lower extremity edema, unexplained weight gain, cough, hemoptysis or wheezing.  The patient also denies abdominal pain, nausea, vomiting, dysphagia, diarrhea, constipation, polyuria, polydipsia, dysuria, hematuria, frequency, urgency, abnormal bleeding or bruising, fever, chills, unexpected weight changes, mood swings, change in skin or hair texture, change in voice quality, auditory or visual problems, allergic reactions or rashes, new musculoskeletal complaints other than usual "aches and pains".   PHYSICAL EXAM BP 128/68  Pulse 75  Ht 5\' 8"  (1.727 m)  Wt 158 lb 9.6 oz (71.94 kg)  BMI 24.12 kg/m2 General: Alert, oriented x3, no distress  Head: no evidence of trauma, PERRL, EOMI, no exophtalmos or lid lag, no myxedema, no xanthelasma; normal ears, nose and oropharynx  Neck: normal jugular venous pulsations and no hepatojugular reflux; brisk carotid pulses without delay and no carotid bruits  Chest: clear to auscultation, no signs of consolidation by percussion or palpation, normal fremitus, symmetrical and full respiratory excursions  Cardiovascular: normal position and quality of the apical impulse, irregular rhythm, normal first and second heart sounds, no murmurs, rubs or gallops  Abdomen: no tenderness or distention, no masses by palpation, no abnormal pulsatility or arterial bruits, normal bowel sounds, no hepatosplenomegaly  Extremities:   Improved appearance of the sore at the the proximal 5th metatarsal head  no clubbing, cyanosis or edema; 2+ radial, ulnar and brachial pulses bilaterally; 2+ right femoral, posterior tibial and dorsalis pedis pulses; 2+ left femoral, posterior tibial and dorsalis pedis pulses; no subclavian or femoral bruits  Neurological: grossly nonfocal   EKG: atrial fibrillation, chronic T wave inversion inferiorly, occasional ventricular pacing  Pacemaker interrogation shows normal lead parameters. There is only 21% ventricular pacing, otherwise ventricular sensed. Estimated battery longevity roughly 9-10 years. No reprogramming changes performed   Lipid Panel     Component Value Date/Time   CHOL 157 03/10/2013   TRIG 93 03/11/2013   HDL 43 03/10/2013   LDLCALC 95 03/10/2013    BMET    Component Value Date/Time   NA 139  03/10/2013   NA 137 10/12/2011 0510   K 4.6 03/10/2013   CL 103 10/12/2011 0510   CO2 23 10/12/2011 0510   GLUCOSE 135* 10/12/2011 0510   BUN 45* 03/10/2013   BUN 38* 10/12/2011 0510   CREATININE 2.7* 03/10/2013   CREATININE 2.03* 10/12/2011 0510   CALCIUM 8.5 10/12/2011 0510   GFRNONAA 27* 10/12/2011 0510   GFRAA 31* 10/12/2011 0510     ASSESSMENT AND PLAN  Cardiac pacemaker - [employer] Zephyr 2010  Dual chamber device programmed VVIR for permanent atrial fibrillation with slow ventricular response. Roughly 25% V pacing (consistent with past history). Normal lead and battery function. Anticipated generator longevity of 10 years. No permanent programming changes. Followup in office every 6 months Permanent atrial fibrillation  Not a candidate for anticoagulation - unsteady gait and prone to injury.  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 EKG 12-Lead   No orders of the defined types were placed in  this encounter.    Patrick Murray  Patrick FairMihai Aleathia Purdy, MD, [facility] [phone] office [phone] pager

## 2013-04-28 LAB — MDC_IDC_ENUM_SESS_TYPE_INCLINIC
Battery Impedance: 1 Ohm — CL
Battery Voltage: 2.79 V
Brady Statistic RV Percent Paced: 21 %
Implantable Pulse Generator Serial Number: 2094981
Lead Channel Sensing Intrinsic Amplitude: 7.5 mV
Lead Channel Setting Pacing Pulse Width: 0.5 ms
Lead Channel Setting Sensing Sensitivity: 2.5 mV
MDC IDC MSMT LEADCHNL RA IMPEDANCE VALUE: 521 Ohm
MDC IDC MSMT LEADCHNL RV PACING THRESHOLD AMPLITUDE: 0.75 V
MDC IDC MSMT LEADCHNL RV PACING THRESHOLD PULSEWIDTH: 0.5 ms
MDC IDC SET LEADCHNL RV PACING AMPLITUDE: 2.5 V

## 2013-04-30 ENCOUNTER — Encounter: Payer: Self-pay | Admitting: Internal Medicine

## 2013-05-03 ENCOUNTER — Encounter: Payer: Self-pay | Admitting: Cardiovascular Disease

## 2013-05-18 NOTE — Telephone Encounter (Signed)
I have not seen an email. What address did he send it to? Can he copy and fax to our office.

## 2013-06-02 NOTE — Telephone Encounter (Signed)
Dr. Chilton Si spoke with Mr Hot

## 2013-06-03 ENCOUNTER — Encounter: Payer: Self-pay | Admitting: Internal Medicine

## 2013-06-22 ENCOUNTER — Encounter: Payer: Self-pay | Admitting: Internal Medicine

## 2013-07-04 ENCOUNTER — Other Ambulatory Visit: Payer: Self-pay | Admitting: Internal Medicine

## 2013-07-11 LAB — BASIC METABOLIC PANEL
BUN: 35 mg/dL — AB (ref 4–21)
CREATININE: 2.5 mg/dL — AB (ref 0.6–1.3)
Glucose: 108 mg/dL
POTASSIUM: 4.2 mmol/L (ref 3.4–5.3)
SODIUM: 137 mmol/L (ref 137–147)

## 2013-07-11 LAB — HEMOGLOBIN A1C: HEMOGLOBIN A1C: 6.3 % — AB (ref 4.0–6.0)

## 2013-07-19 ENCOUNTER — Non-Acute Institutional Stay: Payer: Medicare Other | Admitting: Internal Medicine

## 2013-07-19 ENCOUNTER — Encounter: Payer: Self-pay | Admitting: Internal Medicine

## 2013-07-19 DIAGNOSIS — W19XXXD Unspecified fall, subsequent encounter: Secondary | ICD-10-CM

## 2013-07-19 DIAGNOSIS — R1314 Dysphagia, pharyngoesophageal phase: Secondary | ICD-10-CM

## 2013-07-19 DIAGNOSIS — I482 Chronic atrial fibrillation, unspecified: Secondary | ICD-10-CM

## 2013-07-19 DIAGNOSIS — R269 Unspecified abnormalities of gait and mobility: Secondary | ICD-10-CM

## 2013-07-19 DIAGNOSIS — E785 Hyperlipidemia, unspecified: Secondary | ICD-10-CM

## 2013-07-19 DIAGNOSIS — I4891 Unspecified atrial fibrillation: Secondary | ICD-10-CM

## 2013-07-19 DIAGNOSIS — I10 Essential (primary) hypertension: Secondary | ICD-10-CM

## 2013-07-19 DIAGNOSIS — W19XXXA Unspecified fall, initial encounter: Secondary | ICD-10-CM

## 2013-07-19 DIAGNOSIS — N182 Chronic kidney disease, stage 2 (mild): Secondary | ICD-10-CM

## 2013-07-19 DIAGNOSIS — Z5189 Encounter for other specified aftercare: Secondary | ICD-10-CM

## 2013-07-19 DIAGNOSIS — E1149 Type 2 diabetes mellitus with other diabetic neurological complication: Secondary | ICD-10-CM

## 2013-07-19 DIAGNOSIS — R2681 Unsteadiness on feet: Secondary | ICD-10-CM

## 2013-07-19 DIAGNOSIS — F0390 Unspecified dementia without behavioral disturbance: Secondary | ICD-10-CM

## 2013-07-19 DIAGNOSIS — L97509 Non-pressure chronic ulcer of other part of unspecified foot with unspecified severity: Secondary | ICD-10-CM

## 2013-07-19 DIAGNOSIS — L97529 Non-pressure chronic ulcer of other part of left foot with unspecified severity: Secondary | ICD-10-CM

## 2013-07-19 DIAGNOSIS — E1129 Type 2 diabetes mellitus with other diabetic kidney complication: Secondary | ICD-10-CM

## 2013-07-19 NOTE — Progress Notes (Signed)
Patient ID: Patrick Murray, male   DOB: November 22, 1917, 80 y.o.   MRN: [id number]    Location:  [facility]   Place of Service: Clinic (12)    Allergies  Allergen Reactions  . Aricept [Donepezil Hcl] Other (See Comments)    Unknown   . Simvastatin Other (See Comments)    Unknown     Chief Complaint  Patient presents with  . Medical Management of Chronic Issues    blood pressure, blood sugar, dementia. Here with [facility]    HPI:  Fall, subsequent encounter: multiple and without significant injury  Type II or unspecified type diabetes mellitus with neurological manifestations, not stated as uncontrolled(250.60):controlled  Gait instability: chronic  Dementia, without behavioral disturbance: stable  HYPERTENSION: controlled  HYPERLIPIDEMIA: needs lab  Chronic atrial fibrillation: rate controlled  Chronic kidney disease, stage II (mild): unchanged  Type II or unspecified type diabetes mellitus with renal manifestations, not stated as uncontrolled(250.40): controlled  Dysphagia, pharyngoesophageal phase:occ cough at meals  Ulcer of left foot: improved. No pain.    Medications: Patient's Medications  New Prescriptions   No medications on file  Previous Medications   CHOLECALCIFEROL (VITAMIN D) 2000 UNITS TABLET    Take 1,000 Units by mouth daily.    DEXTROMETHORPHAN (DELSYM) 30 MG/5ML LIQUID    Take 10 mLs (60 mg total) by mouth as needed for cough.   DIPHENHYDRAMINE (BENADRYL) 25 MG TABLET    One every 6 hours if needed for itching   ENSURE (ENSURE)    Take 237 mLs by mouth. One can daily   GALANTAMINE (RAZADYNE) 8 MG TABLET    TAKE 1 TABLET TWICE DAILY.   GLUCOSAMINE-CHONDROITIN (GLUCOSAMINE CHONDR COMPLEX PO)    Take 1 capsule by mouth daily.   LOSARTAN (COZAAR) 25 MG TABLET    TAKE 1 TABLET DAILY FOR BLOOD PRESSURE.   PANTOPRAZOLE (PROTONIX) 40 MG TABLET    TAKE 1 TABLET DAILY TO REDUCE STOMACH ACID.   SERTRALINE (ZOLOFT) 50 MG TABLET    TAKE  ONE TABLET DAILY TO HELP WITH NERVES.  Modified Medications   No medications on file  Discontinued Medications   FEEDING SUPPLEMENT, GLUCERNA SHAKE, (GLUCERNA SHAKE) LIQD    Take 237 mLs by mouth. Drink once a day     Review of Systems  Constitutional: Positive for fever and fatigue. Negative for chills, diaphoresis, activity change, appetite change and unexpected weight change.  HENT: Positive for hearing loss. Negative for congestion, ear pain, facial swelling and nosebleeds.   Eyes: Negative.        Corrective lenses.  Respiratory: Negative for chest tightness and shortness of breath. Cough: dry.   Cardiovascular: Negative for chest pain, palpitations and leg swelling.  Gastrointestinal: Negative for abdominal pain and abdominal distention.       Cough with eating and drinking.  Endocrine: Negative for cold intolerance, heat intolerance, polydipsia, polyphagia and polyuria.       Diabetic.  Genitourinary: Negative.   Musculoskeletal: Positive for arthralgias, gait problem and myalgias. Negative for neck pain and neck stiffness.  Skin:       Healed pressure ulcer of the 5th left MT proximally. Residual scaly area.  Neurological: Positive for dizziness and weakness. Negative for tremors, seizures, syncope, numbness and headaches.       Balance disturbance. Dementia.  Hematological: Negative.   Psychiatric/Behavioral: Positive for behavioral problems and confusion.    There were no vitals filed for this visit. There is no weight on file to  calculate BMI.  Physical Exam  Constitutional:  Frail elderly male  Eyes: Conjunctivae are normal. Pupils are equal, round, and reactive to light.  Neck: Normal range of motion. Neck supple. No JVD present. No tracheal deviation present. No thyromegaly present.  Cardiovascular: Normal rate and intact distal pulses.  Exam reveals no gallop and no friction rub.   No murmur heard. Atrial fib  Pulmonary/Chest: Effort normal. No respiratory  distress. He has no wheezes. He has no rales.  Bronchial rattle.  Abdominal: Soft. Bowel sounds are normal. He exhibits no distension and no mass. There is no tenderness.  Musculoskeletal: Normal range of motion. He exhibits no edema and no tenderness.  Poor balance.  Lymphadenopathy:    He has no cervical adenopathy.  Neurological: He is alert. No cranial nerve deficit. Coordination abnormal.  Skin: Skin is warm and dry. No rash noted. No erythema. No pallor.  Healed ulcer of the left foot at the proximal 5th metatarsal head. Residual scaly area.  Psychiatric: He has a normal mood and affect.  Cheerful. Forgetful.     Labs reviewed: Nursing Home on 07/19/2013  Component Date Value Ref Range Status  . Glucose 07/11/2013 108   Final  . BUN 07/11/2013 35* 4 - 21 mg/dL Final  . Creatinine 25/36/644007/06/2013 2.5* 0.6 - 1.3 mg/dL Final  . Potassium 34/74/259507/06/2013 4.2  3.4 - 5.3 mmol/L Final  . Sodium 07/11/2013 137  137 - 147 mmol/L Final  . Hemoglobin A1C 07/11/2013 6.3* 4.0 - 6.0 % Final  Documentation on 04/30/2013  Component Date Value Ref Range Status  . Hemoglobin 04/21/2013 12.2* 13.5 - 17.5 g/dL Final  . HCT 63/87/564304/16/2015 90* 41 - 53 % Final  . Neutrophils Absolute 04/21/2013 5   Final  . Platelets 04/21/2013 151  150 - 399 K/L Final  . WBC 04/21/2013 6.6   Final  . Glucose 04/21/2013 122   Final  . BUN 04/21/2013 41* 4 - 21 mg/dL Final  . Creatinine 32/95/188404/16/2015 2.4* 0.6 - 1.3 mg/dL Final  . Potassium 16/60/630104/16/2015 4.3  3.4 - 5.3 mmol/L Final  . Sodium 04/21/2013 139  137 - 147 mmol/L Final  . Alkaline Phosphatase 04/21/2013 102  25 - 125 U/L Final  . ALT 04/21/2013 8* 10 - 40 U/L Final  . AST 04/21/2013 12* 14 - 40 U/L Final  . Bilirubin, Total 04/21/2013 0.7   Final  Office Visit on 04/22/2013  Component Date Value Ref Range Status  . Pulse Generator Manufacturer 04/28/2013 [employer]   Final  . Pulse Gen Model 04/28/2013 5826 Zephyr XL DR   Final  . Pulse Gen Serial Number  04/28/2013 [id number]   Final  . RV Sense Sensitivity 04/28/2013 2.5   Final  . RV Pace PulseWidth 04/28/2013 0.5   Final  . RV Pace Amplitude 04/28/2013 2.5   Final  . RA Impedance 04/28/2013 521   Final  . RV Amplitude 04/28/2013 7.5   Final  . RV Pacing Amplitude 04/28/2013 0.75   Final  . RV Pacing PulseWidth 04/28/2013 0.5   Final  . Battery Longevity 04/28/2013 8.75->10 years   Final  . Battery Voltage 04/28/2013 2.79   Final  . Battery Impedance 04/28/2013 1*  Final  . Huston FoleyBrady RV Perc Paced 04/28/2013 21   Final  . Eval Rhythm 04/28/2013 VS 70s-90s   Final  . Miscellaneous Comment 04/28/2013    Final  Value:Pacemaker check in clinic. Normal device function. Threshold, sensing, and impedance consistent with previous measurements. Device programmed to maximize longevity. Permanent AF (not a coumadin candidate). No high ventricular rates noted. Device                          programmed at appropriate safety margins. Histogram distribution appropriate for patient activity level. Device programmed to optimize intrinsic conduction. Estimated longevity 8.75->10 years. Patient to follow up with [facility] in 6 months.      Assessment/Plan  1. Fall, subsequent encounter Chronic and at risk for further falls  2. Type II or unspecified type diabetes mellitus with neurological manifestations, not stated as uncontrolled(250.60) controlled  3. Gait instability Needs hands on assistance or walker  4. Dementia, without behavioral disturbance unchanged  5. HYPERTENSION controlled  6. HYPERLIPIDEMIA -lipids, future  7. Chronic atrial fibrillation Rate controlled  8. Chronic kidney disease, stage II (mild) UNCHANGED  9. Type II or unspecified type diabetes mellitus with renal manifestations, not stated as uncontrolled(250.40) Controlled  10. Dysphagia, pharyngoesophageal phase improved  11. Ulcer of left foot improved

## 2013-07-23 ENCOUNTER — Other Ambulatory Visit: Payer: Self-pay | Admitting: Internal Medicine

## 2013-07-25 ENCOUNTER — Encounter: Payer: Self-pay | Admitting: Internal Medicine

## 2013-10-18 ENCOUNTER — Encounter: Payer: Self-pay | Admitting: Cardiovascular Disease

## 2013-10-18 ENCOUNTER — Ambulatory Visit (INDEPENDENT_AMBULATORY_CARE_PROVIDER_SITE_OTHER): Payer: Medicare Other | Admitting: Cardiovascular Disease

## 2013-10-18 VITALS — BP 148/70 | HR 72 | Resp 16 | Ht 68.0 in | Wt 156.1 lb

## 2013-10-18 DIAGNOSIS — I482 Chronic atrial fibrillation, unspecified: Secondary | ICD-10-CM

## 2013-10-18 DIAGNOSIS — E1122 Type 2 diabetes mellitus with diabetic chronic kidney disease: Secondary | ICD-10-CM

## 2013-10-18 DIAGNOSIS — N182 Chronic kidney disease, stage 2 (mild): Secondary | ICD-10-CM

## 2013-10-18 DIAGNOSIS — I129 Hypertensive chronic kidney disease with stage 1 through stage 4 chronic kidney disease, or unspecified chronic kidney disease: Secondary | ICD-10-CM

## 2013-10-18 DIAGNOSIS — Z950 Presence of cardiac pacemaker: Secondary | ICD-10-CM

## 2013-10-18 DIAGNOSIS — E1121 Type 2 diabetes mellitus with diabetic nephropathy: Secondary | ICD-10-CM

## 2013-10-18 DIAGNOSIS — E1142 Type 2 diabetes mellitus with diabetic polyneuropathy: Secondary | ICD-10-CM

## 2013-10-18 DIAGNOSIS — I10 Essential (primary) hypertension: Secondary | ICD-10-CM

## 2013-10-18 LAB — MDC_IDC_ENUM_SESS_TYPE_INCLINIC
Battery Impedance: 1000 Ohm — CL
Battery Voltage: 2.79 V
Date Time Interrogation Session: 20151013135642
Implantable Pulse Generator Model: 5826
Implantable Pulse Generator Serial Number: 2094981
Lead Channel Setting Sensing Sensitivity: 2.5 mV
MDC IDC MSMT LEADCHNL RV IMPEDANCE VALUE: 513 Ohm
MDC IDC MSMT LEADCHNL RV PACING THRESHOLD AMPLITUDE: 0.5 V
MDC IDC MSMT LEADCHNL RV PACING THRESHOLD PULSEWIDTH: 0.5 ms
MDC IDC MSMT LEADCHNL RV SENSING INTR AMPL: 6.1 mV
MDC IDC SET LEADCHNL RV PACING AMPLITUDE: 2.5 V
MDC IDC SET LEADCHNL RV PACING PULSEWIDTH: 0.5 ms

## 2013-10-18 LAB — PACEMAKER DEVICE OBSERVATION

## 2013-10-18 NOTE — Progress Notes (Signed)
Patient ID: Patrick Murray, male   DOB: 08/29/1917, 80 y.o.   MRN: [id number]     Reason for office visit Permanent atrial fibrillation with slow ventricular response, pacemaker followup  [name] is doing well but continues to be unsteady on his feet and has had falls. As always, he says "I don't know if I deserve it, but I feel well all the time". His memory continues to slowly decline in his hearing is poor despite bilateral hearing aids.  He has permanent atrial fibrillation with slow ventricular response and has a dual chamber permanent pacemaker that is programmed VVIR. Device function is normal. Estimated battery longevity is around 9 years. There are and no episodes of high ventricular rates to speak of. He paces the ventricle roughly 30% of the time, has native rhythm roughly 70% of the time. Lead parameters are excellent and no permanent change in programming was necessary   Allergies  Allergen Reactions  . Aricept [Donepezil Hcl] Other (See Comments)    Unknown   . Simvastatin Other (See Comments)    Unknown     Current Outpatient Prescriptions  Medication Sig Dispense Refill  . Cholecalciferol (VITAMIN D) 2000 UNITS tablet Take 1,000 Units by mouth daily.       Marland Kitchen. dextromethorphan (DELSYM) 30 MG/5ML liquid Take 10 mLs (60 mg total) by mouth as needed for cough.  90 mL  3  . diphenhydrAMINE (BENADRYL) 25 MG tablet One every 6 hours if needed for itching  30 tablet  0  . ENSURE (ENSURE) Take 237 mLs by mouth. One can daily      . galantamine (RAZADYNE) 8 MG tablet TAKE 1 TABLET TWICE DAILY.  180 tablet  1  . Glucosamine-Chondroitin (GLUCOSAMINE CHONDR COMPLEX PO) Take 1 capsule by mouth daily.      Marland Kitchen. losartan (COZAAR) 25 MG tablet TAKE 1 TABLET DAILY FOR BLOOD PRESSURE.  90 tablet  1  . pantoprazole (PROTONIX) 40 MG tablet TAKE 1 TABLET DAILY TO REDUCE STOMACH ACID.  90 tablet  1  . sertraline (ZOLOFT) 50 MG tablet TAKE ONE TABLET DAILY TO HELP WITH NERVES.  30  tablet  5   No current facility-administered medications for this visit.    Past Medical History  Diagnosis Date  . Cardiac pacemaker in situ     a.fib w/slow ventricular response  . Atrial fibrillation   . Myalgia and myositis, unspecified   . Unspecified essential hypertension   . Other and unspecified hyperlipidemia   . Type II or unspecified type diabetes mellitus with renal manifestations, not stated as uncontrolled   . Sinoatrial node dysfunction   . Acute posthemorrhagic anemia   . Depression   . Gastric ulcer   . Dementia   . Vitamin D deficiency   . Gait disorder   . Chronic kidney disease, stage II (mild)   . Acute gastric ulcer with hemorrhage, without mention of obstruction   . Other malaise and fatigue   . Edema   . Personal history of fall   . Hemorrhage of gastrointestinal tract, unspecified 10/13/11  . Muscle weakness (generalized)   . Anemia, unspecified   . Other specified disease of sebaceous glands     xerosis cutis  . Unspecified pruritic disorder   . Unspecified urinary incontinence 01/01/10  . Unspecified hearing loss   . Peripheral vascular disease, unspecified   . Syncope and collapse 08/08/2008  . Abrasion of face   . Cough     Past Surgical History  Procedure Laterality Date  . No past surgeries    . Esophagogastroduodenoscopy  10/08/2011    Procedure: ESOPHAGOGASTRODUODENOSCOPY (EGD);  Surgeon: Willis ModenaWilliam Outlaw, MD;  [facility];  Service: Endoscopy;  Laterality: N/A;  . Pacemaker generator change  08/05/07    [employer]  . Pacemaker insertion  2003  . Appendectomy  1926  . Cholecystectomy  1956  . Tonsillectomy  1926    Family History  Problem Relation Age of Onset  . Heart attack Mother     had a couple  . Heart failure Mother   . Emphysema Father   . Cancer Sister     lung    History   Social History  . Marital Status: Married    Spouse Name: N/A    Number of Children: N/A  . Years of Education: N/A   Occupational  History  . retired     Social History Main Topics  . Smoking status: Former Smoker -- 5 years    Types: Pipe  . Smokeless tobacco: Never Used  . Alcohol Use: No  . Drug Use: No  . Sexual Activity: No   Other Topics Concern  . Not on file   Social History Narrative   Retired. Walks 5 miles for exercise. Does not use ethanol.    [facility]    [place]   Has Pacemaker   Living Will    Review of systems: The patient specifically denies any chest pain at rest or with exertion, dyspnea at rest or with exertion, orthopnea, paroxysmal nocturnal dyspnea, syncope, palpitations, focal neurological deficits, intermittent claudication, lower extremity edema, unexplained weight gain, cough, hemoptysis or wheezing.  The patient also denies abdominal pain, nausea, vomiting, dysphagia, diarrhea, constipation, polyuria, polydipsia, dysuria, hematuria, frequency, urgency, abnormal bleeding or bruising, fever, chills, unexpected weight changes, mood swings, change in skin or hair texture, change in voice quality, auditory or visual problems, allergic reactions or rashes, new musculoskeletal complaints other than usual "aches and pains".   PHYSICAL EXAM BP 148/70  Pulse 72  Resp 16  Ht 5\' 8"  (1.727 m)  Wt 70.806 kg (156 lb 1.6 oz)  BMI 23.74 kg/m2 General: Alert, oriented x3, no distress  Head: no evidence of trauma, PERRL, EOMI, no exophtalmos or lid lag, no myxedema, no xanthelasma; normal ears, nose and oropharynx  Neck: normal jugular venous pulsations and no hepatojugular reflux; brisk carotid pulses without delay and no carotid bruits  Chest: clear to auscultation, no signs of consolidation by percussion or palpation, normal fremitus, symmetrical and full respiratory excursions , healthy pacemaker site Cardiovascular: normal position and quality of the apical impulse, irregular rhythm, normal first and second heart sounds, no murmurs, rubs or gallops  Abdomen: no  tenderness or distention, no masses by palpation, no abnormal pulsatility or arterial bruits, normal bowel sounds, no hepatosplenomegaly  Extremities: no clubbing, cyanosis or edema; 2+ radial, ulnar and brachial pulses bilaterally; 2+ right femoral, posterior tibial and dorsalis pedis pulses; 2+ left femoral, posterior tibial and dorsalis pedis pulses; no subclavian or femoral bruits  Neurological: grossly nonfocal    EKG: Atrial fibrillation with intermittent ventricular pacing  Lipid Panel     Component Value Date/Time   CHOL 157 03/10/2013   TRIG 93 03/11/2013   HDL 43 03/10/2013   LDLCALC 95 03/10/2013    BMET    Component Value Date/Time   NA 137 07/11/2013   NA 137 10/12/2011 0510   K 4.2 07/11/2013   CL  103 10/12/2011 0510   CO2 23 10/12/2011 0510   GLUCOSE 135* 10/12/2011 0510   BUN 35* 07/11/2013   BUN 38* 10/12/2011 0510   CREATININE 2.5* 07/11/2013   CREATININE 2.03* 10/12/2011 0510   CALCIUM 8.5 10/12/2011 0510   GFRNONAA 27* 10/12/2011 0510   GFRAA 31* 10/12/2011 0510     ASSESSMENT AND PLAN Cardiac pacemaker - [employer] Zephyr 2010  Dual chamber device programmed VVIR for permanent atrial fibrillation with slow ventricular response. Roughly 30% V pacing (consistent with past history). Normal lead and battery function. Anticipated generator longevity of 9 years. No permanent programming changes. Followup in office every 6 months  Permanent atrial fibrillation  Not a candidate for anticoagulation - unsteady gait and prone to injury. He does have a very remote history of gastric ulcer and GI bleeding, but this is many years in the past. He takes a daily proton pump inhibitor. I think it is safe to try enteric-coated aspirin 81 mg daily to prevent stroke. 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 Blood pressure is slightly high today, which is unusual  for him, but I would not change his medicines just for this.  Junious Silk, MD, [facility] [phone] office [phone] pager

## 2013-10-18 NOTE — Patient Instructions (Addendum)
Your physician recommends that you schedule a follow-up appointment in: 6 months with Dr.Croitoru   Your physician recommends that you schedule a follow-up appointment in: Start Aspirin 81 mg daily make sure it Enteric Coated

## 2013-10-21 ENCOUNTER — Telehealth: Payer: Self-pay

## 2013-10-21 NOTE — Telephone Encounter (Signed)
Had received form from [facility] for testing supplies for blood sugar. Called Patrick Murray [facility](Care Giver) she had been testing once a day, [name] is not on any medication. She has been doing this for a while. Spoke with Dr. Chilton SiGreen, they can stop doing finger sticks, patient is diet control, we will do lab work to keep watch on his blood sugar. Called Patrick Murray back and told her she can stop doing finger sticks. I will fax form back to [facility] to let them know that Mr Patrick Murray doesn't need the supplies. Patrick Murray appreciated that because they call all the time.

## 2013-10-24 ENCOUNTER — Encounter: Payer: Self-pay | Admitting: Cardiovascular Disease

## 2013-10-29 ENCOUNTER — Other Ambulatory Visit: Payer: Self-pay | Admitting: Internal Medicine

## 2013-10-31 ENCOUNTER — Ambulatory Visit (INDEPENDENT_AMBULATORY_CARE_PROVIDER_SITE_OTHER): Payer: Medicare Other | Admitting: Internal Medicine

## 2013-10-31 ENCOUNTER — Encounter: Payer: Self-pay | Admitting: Internal Medicine

## 2013-10-31 VITALS — BP 128/80 | HR 56 | Temp 97.4°F | Resp 10 | Ht 68.0 in | Wt 152.0 lb

## 2013-10-31 DIAGNOSIS — R2681 Unsteadiness on feet: Secondary | ICD-10-CM

## 2013-10-31 DIAGNOSIS — J209 Acute bronchitis, unspecified: Secondary | ICD-10-CM

## 2013-10-31 DIAGNOSIS — W19XXXD Unspecified fall, subsequent encounter: Secondary | ICD-10-CM

## 2013-10-31 MED ORDER — AZITHROMYCIN 250 MG PO TABS: ORAL_TABLET | ORAL | Status: DC

## 2013-10-31 MED ORDER — SACCHAROMYCES BOULARDII 250 MG PO CAPS: 250.0000 mg | ORAL_CAPSULE | Freq: Two times a day (BID) | ORAL | Status: DC

## 2013-10-31 NOTE — Progress Notes (Signed)
Patient ID: Patrick Murray, male   DOB: 02-07-1917, 80 y.o.   MRN: [id number]   Location:  [facility]  Code Status: full code  Allergies  Allergen Reactions  . Aricept [Donepezil Hcl] Other (See Comments)    Unknown   . Simvastatin Other (See Comments)    Unknown     Chief Complaint  Patient presents with  . Acute Visit    Cough(productive), weakness and runny nose   . Fall    Patient fell friday night- ? details of fall     HPI: Patient is a 80 y.o. male with h/o DMII with neurologic manifestations and nephropathy, hyperlipidemia, afib, DKII, dementia, pharyngoesophageal phase dysphagia seen in the office today for an acute visit for productive cough, weakness and runny nose.  He also fell Friday.  He hit his head.  No new pain.  He is accompanied by his caregiver.  He says he feels good.  Some coughing this morning.  Not nearly as bad.  Drank almost 8 oz of water.  Appetite has been iffy, but not as bad as when this began last week.  Sleeps like a log. No chills.  No fever here.  Has improved since got appt.  Did cough up mucus Saturday morning--not clear.  Some sputum was on the floor when the other caretaker was there.    Had flu shot last week.    Review of Systems:  Review of Systems  Constitutional: Negative for fever, chills and malaise/fatigue.  HENT: Positive for congestion.        Runny nose  Eyes: Negative for blurred vision.  Respiratory: Positive for cough and sputum production. Negative for shortness of breath.   Cardiovascular: Negative for chest pain and leg swelling.  Gastrointestinal: Negative for abdominal pain, blood in stool and melena.  Musculoskeletal: Positive for falls.       Larey Seat and has bruises on head, left 5th digit, right arm  Neurological: Negative for dizziness, weakness and headaches.       Aphasia  Endo/Heme/Allergies: Bruises/bleeds easily.  Psychiatric/Behavioral: Positive for memory loss.  Negative for depression.     Past Medical History  Diagnosis Date  . Cardiac pacemaker in situ     a.fib w/slow ventricular response  . Atrial fibrillation   . Myalgia and myositis, unspecified   . Unspecified essential hypertension   . Other and unspecified hyperlipidemia   . Type II or unspecified type diabetes mellitus with renal manifestations, not stated as uncontrolled   . Sinoatrial node dysfunction   . Acute posthemorrhagic anemia   . Depression   . Gastric ulcer   . Dementia   . Vitamin D deficiency   . Gait disorder   . Chronic kidney disease, stage II (mild)   . Acute gastric ulcer with hemorrhage, without mention of obstruction   . Other malaise and fatigue   . Edema   . Personal history of fall   . Hemorrhage of gastrointestinal tract, unspecified 10/13/11  . Muscle weakness (generalized)   . Anemia, unspecified   . Other specified disease of sebaceous glands     xerosis cutis  . Unspecified pruritic disorder   . Unspecified urinary incontinence 01/01/10  . Unspecified hearing loss   . Peripheral vascular disease, unspecified   . Syncope and collapse 08/08/2008  . Abrasion of face   . Cough     Past Surgical History  Procedure Laterality Date  . No past surgeries    .  Esophagogastroduodenoscopy  10/08/2011    Procedure: ESOPHAGOGASTRODUODENOSCOPY (EGD);  Surgeon: Willis ModenaWilliam Outlaw, MD;  [facility];  Service: Endoscopy;  Laterality: N/A;  . Pacemaker generator change  08/05/07    [employer]  . Pacemaker insertion  2003  . Appendectomy  1926  . Cholecystectomy  1956  . Tonsillectomy  1926    Social History:   reports that he has quit smoking. His smoking use included Pipe. He has never used smokeless tobacco. He reports that he does not drink alcohol or use illicit drugs.  Family History  Problem Relation Age of Onset  . Heart attack Mother     had a couple  . Heart failure Mother   . Emphysema Father   . Cancer Sister     lung     Medications: Patient's Medications  New Prescriptions   No medications on file  Previous Medications   CHOLECALCIFEROL (VITAMIN D) 2000 UNITS TABLET    Take 1,000 Units by mouth daily.    DEXTROMETHORPHAN (DELSYM) 30 MG/5ML LIQUID    Take 10 mLs (60 mg total) by mouth as needed for cough.   DIPHENHYDRAMINE (BENADRYL) 25 MG TABLET    One every 6 hours if needed for itching   ENSURE (ENSURE)    Take 237 mLs by mouth. One can daily   GALANTAMINE (RAZADYNE) 8 MG TABLET    TAKE 1 TABLET TWICE DAILY.   GLUCOSAMINE-CHONDROITIN (GLUCOSAMINE CHONDR COMPLEX PO)    Take 1 capsule by mouth daily.   LOSARTAN (COZAAR) 25 MG TABLET    TAKE 1 TABLET DAILY FOR BLOOD PRESSURE.   PANTOPRAZOLE (PROTONIX) 40 MG TABLET    TAKE 1 TABLET DAILY TO REDUCE STOMACH ACID.   SERTRALINE (ZOLOFT) 50 MG TABLET    TAKE ONE TABLET DAILY TO HELP WITH NERVES.  Modified Medications   No medications on file  Discontinued Medications   No medications on file     Physical Exam: Filed Vitals:   10/31/13 1146  BP: 128/80  Pulse: 56  Temp: 97.4 F (36.3 C)  TempSrc: Oral  Resp: 10  Height: 5\' 8"  (1.727 m)  Weight: 152 lb (68.947 kg)  SpO2: 99%  Physical Exam  Constitutional:  Frail white male seated in wheelchair today;  Normally walks with walker at home (not adherent per caregiver)  HENT:  Head: Normocephalic and atraumatic.  Cardiovascular: Normal rate, regular rhythm, normal heart sounds and intact distal pulses.   Pulmonary/Chest: Effort normal. No respiratory distress.  Coarse wet rhonchi throughout both lung fields  Abdominal: Soft. Bowel sounds are normal. He exhibits no distension and no mass. There is no tenderness.  Musculoskeletal: Normal range of motion. He exhibits no edema and no tenderness.  Neurological: He is alert.  Oriented to person  Skin: Skin is warm and dry.  Bruises of left scalp and has excoriation of left 5th digit, small ecchymoses of right upper arm  Psychiatric: He has a normal  mood and affect.    Labs reviewed: Basic Metabolic Panel:  Recent Labs  16/10/9601/05/15 04/21/13 07/11/13  NA 139 139 137  K 4.6 4.3 4.2  BUN 45* 41* 35*  CREATININE 2.7* 2.4* 2.5*   Liver Function Tests:  Recent Labs  03/10/13 04/21/13  AST 13* 12*  ALT 10 8*  ALKPHOS 107 102   No results found for this basename: LIPASE, AMYLASE,  in the last 8760 hours No results found for this basename: AMMONIA,  in the last 8760 hours CBC:  Recent Labs  03/10/13  04/21/13  WBC 6.4 6.6  NEUTROABS  --  5  HGB 12.0* 12.2*  HCT 36* 90*  PLT 128* 151   Lipid Panel:  Recent Labs  03/10/13 03/11/13  CHOL 157  --   HDL 43  --   LDLCALC 95  --   TRIG  --  93   Lab Results  Component Value Date   HGBA1C 6.3* 07/11/2013    Assessment/Plan 1. Acute bronchitis, unspecified organism -seems to be improving on his own -encourage fluids, conservative mgt, plus abx with zpak due to lung sounds  - azithromycin (ZITHROMAX) 250 MG tablet; 2 tabs today and one tab daily for 4 days  Dispense: 6 tablet; Refill: 0 - saccharomyces boulardii (FLORASTOR) 250 MG capsule; Take 1 capsule (250 mg total) by mouth 2 (two) times daily.  Dispense: 14 capsule; Refill: 0 - DG Chest 2 View to r/o pneumonia (has h/o dysphagia on his chart)  2. Fall, subsequent encounter -encouraged use of his walker regularly, but short term memory loss so unlikely to remember if caregivers are not present -fall could have been due to bronchitis beginning and acute illness  3. Gait instability -use walker, has just has  Labs/tests ordered: Orders Placed This Encounter  Procedures  . DG Chest 2 View    Order Specific Question:  Reason for Exam (SYMPTOM  OR DIAGNOSIS REQUIRED)    Answer:  coarse wet cough, rhonchi    Order Specific Question:  Preferred imaging location?    Answer:  [id number]    Next appt:  Prn, and keep with Dr. Chilton SiGreen for regular visits  Jakaylee Sasaki L. Elim Peale, D.O. Geriatrics [facility] [address] [place], [place] [address] Cell Phone (Mon-Fri 8am-5pm):  [phone] On Call:  [phone] & follow prompts after 5pm & weekends Office Phone:  [phone] Office Fax:  [phone]

## 2013-10-31 NOTE — Patient Instructions (Signed)
Push po fluids Keep head of bed elevated to help prevent aspiration Please get chest xray done to rule out pneumonia Suspect bronchitis You may use the delsym syrup for the cough and congestion

## 2013-11-01 ENCOUNTER — Ambulatory Visit
Admission: RE | Admit: 2013-11-01 | Discharge: 2013-11-01 | Disposition: A | Discharge status: Home / Self Care | Payer: Medicare Other | Source: Ambulatory Visit | Attending: Internal Medicine | Admitting: Internal Medicine

## 2013-11-12 ENCOUNTER — Other Ambulatory Visit: Payer: Self-pay | Admitting: Internal Medicine

## 2013-11-14 LAB — BASIC METABOLIC PANEL
BUN: 37 mg/dL — AB (ref 4–21)
Creatinine: 2.4 mg/dL — AB (ref 0.6–1.3)
GLUCOSE: 107 mg/dL
Potassium: 4.7 mmol/L (ref 3.4–5.3)
Sodium: 137 mmol/L (ref 137–147)

## 2013-11-14 LAB — LIPID PANEL
Cholesterol: 162 mg/dL (ref 0–200)
HDL: 39 mg/dL (ref 35–70)
LDL CALC: 97 mg/dL
LDL/HDL RATIO: 4.2
Triglycerides: 128 mg/dL (ref 40–160)

## 2013-11-22 ENCOUNTER — Encounter: Payer: Self-pay | Admitting: Internal Medicine

## 2013-11-22 ENCOUNTER — Non-Acute Institutional Stay: Payer: Medicare Other | Admitting: Internal Medicine

## 2013-11-22 VITALS — BP 130/76 | HR 68 | Temp 97.6°F | Wt 156.0 lb

## 2013-11-22 DIAGNOSIS — W19XXXD Unspecified fall, subsequent encounter: Secondary | ICD-10-CM

## 2013-11-22 DIAGNOSIS — L97529 Non-pressure chronic ulcer of other part of left foot with unspecified severity: Secondary | ICD-10-CM

## 2013-11-22 DIAGNOSIS — L97509 Non-pressure chronic ulcer of other part of unspecified foot with unspecified severity: Secondary | ICD-10-CM

## 2013-11-22 DIAGNOSIS — E1121 Type 2 diabetes mellitus with diabetic nephropathy: Secondary | ICD-10-CM

## 2013-11-22 DIAGNOSIS — R1314 Dysphagia, pharyngoesophageal phase: Secondary | ICD-10-CM

## 2013-11-22 DIAGNOSIS — E785 Hyperlipidemia, unspecified: Secondary | ICD-10-CM

## 2013-11-22 DIAGNOSIS — L299 Pruritus, unspecified: Secondary | ICD-10-CM

## 2013-11-22 DIAGNOSIS — N182 Chronic kidney disease, stage 2 (mild): Secondary | ICD-10-CM

## 2013-11-22 DIAGNOSIS — E1142 Type 2 diabetes mellitus with diabetic polyneuropathy: Secondary | ICD-10-CM

## 2013-11-22 DIAGNOSIS — F0390 Unspecified dementia without behavioral disturbance: Secondary | ICD-10-CM

## 2013-11-22 DIAGNOSIS — E11621 Type 2 diabetes mellitus with foot ulcer: Secondary | ICD-10-CM

## 2013-11-22 DIAGNOSIS — I10 Essential (primary) hypertension: Secondary | ICD-10-CM

## 2013-11-22 MED ORDER — PYRITHIONE ZINC 1 % EX SHAM: MEDICATED_SHAMPOO | CUTANEOUS | Status: AC

## 2013-11-22 NOTE — Progress Notes (Signed)
Failed clock

## 2013-11-22 NOTE — Progress Notes (Signed)
Patient ID: Patrick Murray, male   DOB: 10/13/1917, 80 y.o.   MRN: [id number]    [facility]     [facility] of Service: Clinic (12)    Allergies  Allergen Reactions  . Aricept [Donepezil Hcl] Other (See Comments)    Unknown   . Simvastatin Other (See Comments)    Unknown     Chief Complaint  Patient presents with  . Medical Management of Chronic Issues    blood pressure, blood sugar, dementia, history of fall.  . Bronchitis    saw Dr. Renato Gailseed 10/31/13, better per Care Giver, but he seems to have some clear phlegm after eating, coughing and nose runs while eating.. Hasn't fallen any since last time.  . Wound Infection    for several days on little toe on left foot    HPI:  DM type 2 with diabetic foot ulcer: ulcers present on the left foot proximal left fifth metatarsal.  This area has a small scab but there is no deep ulceration.  It is tender to palpation.  Essential hypertension: controlled  Fall, subsequent encounter: no residual injury  Ulcer of left foot: left 5th metatarsal proximally. Also has healing area of the left 5th toe medially at the toenail  Dementia, without behavioral disturbance: unchanged. He is dependent on his caretaker for all ADL.  Chronic kidney disease, stage II (mild): unchanged  Dysphagia, pharyngoesophageal phase: coughs at meals. Worse with crackers that his son purchases.  Hyperlipemia: controlled  Type 2 diabetes mellitus with diabetic polyneuropathy: controlled and unchanged.  Diabetic nephropathy associated with type 2 diabetes mellitus: controlled and unchanged  Pruritus: scalp itching now. Has had prior issues with truncal itching, but this seems like it is under control.    Medications: Patient's Medications  New Prescriptions   No medications on file  Previous Medications   CHOLECALCIFEROL (VITAMIN D) 2000 UNITS TABLET    Take 1,000 Units by mouth daily.    DEXTROMETHORPHAN (DELSYM) 30 MG/5ML LIQUID    Take 10  mLs (60 mg total) by mouth as needed for cough.   DIPHENHYDRAMINE (BENADRYL) 25 MG TABLET    One every 6 hours if needed for itching   ENSURE (ENSURE)    Take 237 mLs by mouth. One can daily   GALANTAMINE (RAZADYNE) 8 MG TABLET    TAKE 1 TABLET TWICE DAILY.   GLUCOSAMINE-CHONDROITIN (GLUCOSAMINE CHONDR COMPLEX PO)    Take 1 capsule by mouth daily.   LOSARTAN (COZAAR) 25 MG TABLET    TAKE 1 TABLET DAILY FOR BLOOD PRESSURE.   PANTOPRAZOLE (PROTONIX) 40 MG TABLET    TAKE 1 TABLET DAILY TO REDUCE STOMACH ACID.   SACCHAROMYCES BOULARDII (FLORASTOR) 250 MG CAPSULE    Take 1 capsule (250 mg total) by mouth 2 (two) times daily.   SERTRALINE (ZOLOFT) 50 MG TABLET    TAKE ONE TABLET DAILY TO HELP WITH NERVES.  Modified Medications   No medications on file  Discontinued Medications   AZITHROMYCIN (ZITHROMAX) 250 MG TABLET    2 tabs today and one tab daily for 4 days     Review of Systems  Constitutional: Positive for fever and fatigue. Negative for chills, diaphoresis, activity change, appetite change and unexpected weight change.  HENT: Positive for hearing loss. Negative for congestion, ear pain, facial swelling and nosebleeds.   Eyes: Negative.        Corrective lenses.  Respiratory: Negative for chest tightness and shortness of breath. Cough: dry.   Cardiovascular: Negative for  chest pain, palpitations and leg swelling.  Gastrointestinal: Negative for abdominal pain and abdominal distention.       Cough with eating and drinking.  Endocrine: Negative for cold intolerance, heat intolerance, polydipsia, polyphagia and polyuria.       Diabetic.  Genitourinary: Negative.   Musculoskeletal: Positive for myalgias, arthralgias and gait problem. Negative for neck pain and neck stiffness.  Skin:       Healed pressure ulcer of the 5th left MT proximally. Residual scaly area.  Neurological: Positive for dizziness and weakness. Negative for tremors, seizures, syncope, numbness and headaches.        Balance disturbance. Dementia.  Hematological: Negative.   Psychiatric/Behavioral: Positive for behavioral problems and confusion.    Filed Vitals:   11/22/13 1121  BP: 130/76  Pulse: 68  Temp: 97.6 F (36.4 C)  TempSrc: Oral  Weight: 156 lb (70.761 kg)   Body mass index is 23.73 kg/(m^2).  Physical Exam  Constitutional:  Frail elderly male  Eyes: Conjunctivae are normal. Pupils are equal, round, and reactive to light.  Neck: Normal range of motion. Neck supple. No JVD present. No tracheal deviation present. No thyromegaly present.  Cardiovascular: Normal rate and intact distal pulses.  Exam reveals no gallop and no friction rub.   No murmur heard. Atrial fib  Pulmonary/Chest: Effort normal. No respiratory distress. He has no wheezes. He has no rales.  Bronchial rattle.  Abdominal: Soft. Bowel sounds are normal. He exhibits no distension and no mass. There is no tenderness.  Musculoskeletal: Normal range of motion. He exhibits no edema or tenderness.  Poor balance.  Lymphadenopathy:    He has no cervical adenopathy.  Neurological: He is alert. No cranial nerve deficit. Coordination abnormal.  11/22/13 MMSE 17/30. Failed clock drawing.  Skin: Skin is warm and dry. No rash noted. No erythema. No pallor.  Healed ulcer of the left foot at the proximal 5th metatarsal head. Residual scaly area.  Psychiatric: He has a normal mood and affect.  Cheerful. Forgetful.     Labs reviewed: Nursing Home on 11/22/2013  Component Date Value Ref Range Status  . Glucose 11/14/2013 107   Final  . BUN 11/14/2013 37* 4 - 21 mg/dL Final  . Creatinine 16/10/9602 2.4* 0.6 - 1.3 mg/dL Final  . Potassium 54/09/8117 4.7  3.4 - 5.3 mmol/L Final  . Sodium 11/14/2013 137  137 - 147 mmol/L Final  . LDl/HDL Ratio 11/14/2013 4.2   Final  . Triglycerides 11/14/2013 128  40 - 160 mg/dL Final  . Cholesterol 14/78/2956 162  0 - 200 mg/dL Final  . HDL 21/30/8657 39  35 - 70 mg/dL Final  . LDL  Cholesterol 11/14/2013 97   Final  Office Visit on 10/18/2013  Component Date Value Ref Range Status  . Date Time Interrogation Session 10/18/2013 [id number]   Final  . Pulse Generator Manufacturer 10/18/2013 [employer]   Final  . Pulse Gen Model 10/18/2013 [address]   Final  . Pulse Gen Serial Number 10/18/2013 [id number]   Final  . RV Sense Sensitivity 10/18/2013 2.5   Final  . RV Pace PulseWidth 10/18/2013 0.5   Final  . RV Pace Amplitude 10/18/2013 2.50   Final  . RV IMPEDANCE 10/18/2013 513   Final  . RV Amplitude 10/18/2013 6.1   Final  . RV Pacing Amplitude 10/18/2013 0.50   Final  . RV Pacing PulseWidth 10/18/2013 0.5   Final  . Battery Status 10/18/2013 Unknown   Final  .  Battery Longevity 10/18/2013 8.50 - >10 years   Final  . Battery Voltage 10/18/2013 2.79   Final  . Battery Impedance 10/18/2013 1000*  Final  . Eval Rhythm 10/18/2013 VS   Final  . Miscellaneous Comment 10/18/2013    Final                   Value:Pacemaker check in clinic. Normal device function. Threshold, sensing, and impedance consistent with previous measurements. Device programmed to maximize longevity. Permanent AF---fall risk. No high ventricular rates noted. Device programmed at                          appropriate safety margins. Histogram distribution appropriate for patient activity level. Device programmed to optimize intrinsic conduction. Estimated longevity 8.5->10 years. [facility]     Assessment/Plan 1. DM type 2 with diabetic foot ulcer Foot ulcers seem to be healing on the left foot. -A1c, CMP, urine microalbumin (future)  2. Essential hypertension -CMP  3. Fall, subsequent encounter No residual problems  4. Ulcer of left foot healing  5. Dementia, without behavioral disturbance Slow deterioration.  MMSE is falling.  6. Chronic kidney disease, stage II (mild) Chronically elevated BUN and creatinine.  Stable.  7. Dysphagia,  pharyngoesophageal phase Persistent problem with coughing at meals.  8. Hyperlipemia controlled  9. Type 2 diabetes mellitus with diabetic polyneuropathy Controlled  10. Diabetic nephropathy associated with type 2 diabetes mellitus controlled  11. Pruritus - pyrithione zinc (SELSUN BLUE DRY SCALP) 1 % shampoo; Shampoo at least 3 times weekly  Dispense: 400 mL; Refill: 12

## 2013-12-06 ENCOUNTER — Encounter: Payer: Self-pay | Admitting: Internal Medicine

## 2014-01-31 ENCOUNTER — Non-Acute Institutional Stay: Payer: Medicare Other | Admitting: Internal Medicine

## 2014-01-31 ENCOUNTER — Encounter: Payer: Self-pay | Admitting: Internal Medicine

## 2014-01-31 VITALS — BP 166/68 | HR 68 | Temp 97.3°F

## 2014-01-31 DIAGNOSIS — I10 Essential (primary) hypertension: Secondary | ICD-10-CM

## 2014-01-31 DIAGNOSIS — L600 Ingrowing nail: Secondary | ICD-10-CM

## 2014-01-31 MED ORDER — CEPHALEXIN 500 MG PO CAPS: ORAL_CAPSULE | ORAL | Status: DC

## 2014-01-31 NOTE — Progress Notes (Signed)
Patient ID: Patrick Murray, male   DOB: 05/30/1917, 81 y.o.   MRN: [id number]    [facility]     [facility] of Service: Clinic (12)    Allergies  Allergen Reactions  . Aricept [Donepezil Hcl] Other (See Comments)    Unknown   . Simvastatin Other (See Comments)    Unknown     Chief Complaint  Patient presents with  . infected toe    right big, since last night  . swelling hand    right off and on    HPI:  Ingrown right greater toenail: present for a couple of weeks. Acutely swollen since last night. Some pain. Sitter has been filing the nail.  Essential hypertension: elevated SBP today. No dizziness, headache, chest discomfort or palpitations.    Medications: Patient's Medications  New Prescriptions   No medications on file  Previous Medications   ASPIRIN 81 MG TABLET    Take 81 mg by mouth daily.   CHOLECALCIFEROL (VITAMIN D) 2000 UNITS TABLET    Take 1,000 Units by mouth daily.    DEXTROMETHORPHAN (DELSYM) 30 MG/5ML LIQUID    Take 10 mLs (60 mg total) by mouth as needed for cough.   DIPHENHYDRAMINE (BENADRYL) 25 MG TABLET    One every 6 hours if needed for itching   ENSURE (ENSURE)    Take 237 mLs by mouth. One can daily   GALANTAMINE (RAZADYNE) 8 MG TABLET    TAKE 1 TABLET TWICE DAILY.   GLUCOSAMINE-CHONDROITIN (GLUCOSAMINE CHONDR COMPLEX PO)    Take 1 capsule by mouth daily.   LOSARTAN (COZAAR) 25 MG TABLET    TAKE 1 TABLET DAILY FOR BLOOD PRESSURE.   PANTOPRAZOLE (PROTONIX) 40 MG TABLET    TAKE 1 TABLET DAILY TO REDUCE STOMACH ACID.   PYRITHIONE ZINC (SELSUN BLUE DRY SCALP) 1 % SHAMPOO    Shampoo at least 3 times weekly   SACCHAROMYCES BOULARDII (FLORASTOR) 250 MG CAPSULE    Take 1 capsule (250 mg total) by mouth 2 (two) times daily.   SERTRALINE (ZOLOFT) 50 MG TABLET    TAKE ONE TABLET DAILY TO HELP WITH NERVES.  Modified Medications   No medications on file  Discontinued Medications   No medications on file     Review of Systems    Constitutional: Positive for fever and fatigue. Negative for chills, diaphoresis, activity change, appetite change and unexpected weight change.  HENT: Positive for hearing loss. Negative for congestion, ear pain, facial swelling and nosebleeds.   Eyes: Negative.        Corrective lenses.  Respiratory: Negative for chest tightness and shortness of breath. Cough: dry.   Cardiovascular: Negative for chest pain, palpitations and leg swelling.  Gastrointestinal: Negative for abdominal pain and abdominal distention.       Cough with eating and drinking.  Endocrine: Negative for cold intolerance, heat intolerance, polydipsia, polyphagia and polyuria.       Diabetic.  Genitourinary: Negative.   Musculoskeletal: Positive for myalgias, arthralgias and gait problem. Negative for neck pain and neck stiffness.  Skin:       Inflamed right great toe and an area medially of ingrown nail that has been filed..  Neurological: Positive for dizziness and weakness. Negative for tremors, seizures, syncope, numbness and headaches.       Balance disturbance. Dementia.  Hematological: Negative.   Psychiatric/Behavioral: Positive for behavioral problems and confusion.    Filed Vitals:   01/31/14 1111  BP: 166/68  Pulse: 68  Temp:  97.3 F (36.3 C)  TempSrc: Oral   There is no weight on file to calculate BMI.  Physical Exam  Constitutional:  Frail elderly male  Eyes: Conjunctivae are normal. Pupils are equal, round, and reactive to light.  Neck: Normal range of motion. Neck supple. No JVD present. No tracheal deviation present. No thyromegaly present.  Cardiovascular: Normal rate and intact distal pulses.  Exam reveals no gallop and no friction rub.   No murmur heard. Atrial fib  Pulmonary/Chest: Effort normal. No respiratory distress. He has no wheezes. He has no rales.  Bronchial rattle.  Abdominal: Soft. Bowel sounds are normal. He exhibits no distension and no mass. There is no tenderness.   Musculoskeletal: Normal range of motion. He exhibits no edema or tenderness.  Poor balance.  Lymphadenopathy:    He has no cervical adenopathy.  Neurological: He is alert. No cranial nerve deficit. Coordination abnormal.  11/22/13 MMSE 17/30. Failed clock drawing.  Skin: Skin is warm and dry. No rash noted. No erythema. No pallor.  Healed ulcer of the left foot at the proximal 5th metatarsal head. Residual scaly area. Inflamed right great toe medially.  Psychiatric: He has a normal mood and affect.  Cheerful. Forgetful.     Labs reviewed: Nursing Home on 11/22/2013  Component Date Value Ref Range Status  . Glucose 11/14/2013 107   Final  . BUN 11/14/2013 37* 4 - 21 mg/dL Final  . Creatinine 92/11/9415 2.4* 0.6 - 1.3 mg/dL Final  . Potassium 40/81/4481 4.7  3.4 - 5.3 mmol/L Final  . Sodium 11/14/2013 137  137 - 147 mmol/L Final  . LDl/HDL Ratio 11/14/2013 4.2   Final  . Triglycerides 11/14/2013 128  40 - 160 mg/dL Final  . Cholesterol 85/63/1497 162  0 - 200 mg/dL Final  . HDL 02/63/7858 39  35 - 70 mg/dL Final  . LDL Cholesterol 11/14/2013 97   Final     Assessment/Plan  1. Ingrown right greater toenail -soak in warm water 20 min daily - cephALEXin (KEFLEX) 500 MG capsule; One 3 times daily to treat infection  Dispense: 21 capsule; Refill: 1  2. Essential hypertension Elevated SBP. No change in medications. Recheck next visit.

## 2014-02-07 ENCOUNTER — Other Ambulatory Visit: Payer: Self-pay | Admitting: Internal Medicine

## 2014-02-12 ENCOUNTER — Other Ambulatory Visit: Payer: Self-pay | Admitting: Internal Medicine

## 2014-02-14 ENCOUNTER — Ambulatory Visit (INDEPENDENT_AMBULATORY_CARE_PROVIDER_SITE_OTHER): Payer: Medicare Other | Admitting: Internal Medicine

## 2014-02-14 ENCOUNTER — Encounter: Payer: Self-pay | Admitting: Internal Medicine

## 2014-02-14 VITALS — BP 150/72 | HR 59 | Temp 97.4°F | Resp 20

## 2014-02-14 DIAGNOSIS — H9222 Otorrhagia, left ear: Secondary | ICD-10-CM

## 2014-02-14 DIAGNOSIS — H9220 Otorrhagia, unspecified ear: Secondary | ICD-10-CM | POA: Insufficient documentation

## 2014-02-14 NOTE — Progress Notes (Signed)
Patient ID: Patrick Murray, male   DOB: 03/31/1917, 81 y.o.   MRN: [id number]    Facility  [facility]    Place of Service:   OFFICE   Allergies  Allergen Reactions  . Aricept [Donepezil Hcl] Other (See Comments)    Unknown   . Simvastatin Other (See Comments)    Unknown     Chief Complaint  Patient presents with  . Acute Visit    Dried blood seen on pillow on Monday morning by caregiver    HPI:  Patient was brought to visit today because of caretakers viewing of blood on a pillow yesterday morning. He has some old bruises above the left elbow. He had a fall last week. There was no other substantial injury and it is unknown whether the blood on the pillowcase had anything to do with traumatic injury to the ears. Patient denies pain in the ears. There are some tiny scabs on the face without active bleeding at this time.  Medications: Patient's Medications  New Prescriptions   No medications on file  Previous Medications   ASPIRIN 81 MG TABLET    Take 81 mg by mouth daily.   CHOLECALCIFEROL (VITAMIN D) 2000 UNITS TABLET    Take 1,000 Units by mouth daily.    DEXTROMETHORPHAN (DELSYM) 30 MG/5ML LIQUID    Take 10 mLs (60 mg total) by mouth as needed for cough.   DIPHENHYDRAMINE (BENADRYL) 25 MG TABLET    One every 6 hours if needed for itching   ENSURE (ENSURE)    Take 237 mLs by mouth. One can daily   GALANTAMINE (RAZADYNE) 8 MG TABLET    TAKE 1 TABLET TWICE DAILY.   GLUCOSAMINE-CHONDROITIN (GLUCOSAMINE CHONDR COMPLEX PO)    Take 1 capsule by mouth daily.   LOSARTAN (COZAAR) 25 MG TABLET    TAKE 1 TABLET DAILY FOR BLOOD PRESSURE.   PANTOPRAZOLE (PROTONIX) 40 MG TABLET    TAKE 1 TABLET DAILY TO REDUCE STOMACH ACID.   PYRITHIONE ZINC (SELSUN BLUE DRY SCALP) 1 % SHAMPOO    Shampoo at least 3 times weekly   SERTRALINE (ZOLOFT) 50 MG TABLET    TAKE ONE TABLET DAILY TO HELP WITH NERVES.  Modified Medications   No medications on file  Discontinued Medications   CEPHALEXIN (KEFLEX) 500  MG CAPSULE    One 3 times daily to treat infection   SACCHAROMYCES BOULARDII (FLORASTOR) 250 MG CAPSULE    Take 1 capsule (250 mg total) by mouth 2 (two) times daily.     Review of Systems  Constitutional: Positive for fever and fatigue. Negative for chills, diaphoresis, activity change, appetite change and unexpected weight change.  HENT: Positive for hearing loss. Negative for congestion, ear pain, facial swelling and nosebleeds.   Eyes: Negative.        Corrective lenses.  Respiratory: Negative for chest tightness and shortness of breath. Cough: dry.   Cardiovascular: Negative for chest pain, palpitations and leg swelling.  Gastrointestinal: Negative for abdominal pain and abdominal distention.       Cough with eating and drinking.  Endocrine: Negative for cold intolerance, heat intolerance, polydipsia, polyphagia and polyuria.       Diabetic.  Genitourinary: Negative.   Musculoskeletal: Positive for myalgias, arthralgias and gait problem. Negative for neck pain and neck stiffness.  Skin:       Inflamed right great toe and an area medially of ingrown nail that has been filed..  Neurological: Positive for dizziness and weakness. Negative for tremors,  seizures, syncope, numbness and headaches.       Balance disturbance. Dementia.  Hematological: Negative.   Psychiatric/Behavioral: Positive for behavioral problems and confusion.     Filed Vitals:   02/14/14 1524  BP: 150/72  Pulse: 59  Temp: 97.4 F (36.3 C)  TempSrc: Oral  Resp: 20  SpO2: 96%   There is no weight on file to calculate BMI.  Physical Exam  Constitutional:  Frail elderly male  HENT:  Bilateral hearing aids. Loss of hearing. Rusty wax present more prominently in the left external auditory canal. No bleeding or scabs. Tympanic membranes have some scarring bilaterally but no acute injury.  Eyes: Conjunctivae are normal. Pupils are equal, round, and reactive to light.  Neck: Normal range of motion. Neck supple.  No JVD present. No tracheal deviation present. No thyromegaly present.  Cardiovascular: Normal rate and intact distal pulses.  Exam reveals no gallop and no friction rub.   No murmur heard. Atrial fib  Pulmonary/Chest: Effort normal. No respiratory distress. He has no wheezes. He has no rales.  Bronchial rattle.  Abdominal: Soft. Bowel sounds are normal. He exhibits no distension and no mass. There is no tenderness.  Musculoskeletal: Normal range of motion. He exhibits no edema or tenderness.  Poor balance.  Lymphadenopathy:    He has no cervical adenopathy.  Neurological: He is alert. No cranial nerve deficit. Coordination abnormal.  11/22/13 MMSE 17/30. Failed clock drawing.  Skin: Skin is warm and dry. No rash noted. No erythema. No pallor.  Healed ulcer of the left foot at the proximal 5th metatarsal head. Residual scaly area. Inflamed right great toe medially.  Psychiatric: He has a normal mood and affect.  Cheerful. Forgetful.     Labs reviewed: Nursing Home on 11/22/2013  Component Date Value Ref Range Status  . Glucose 11/14/2013 107   Final  . BUN 11/14/2013 37* 4 - 21 mg/dL Final  . Creatinine 16/10/9602 2.4* 0.6 - 1.3 mg/dL Final  . Potassium 54/09/8117 4.7  3.4 - 5.3 mmol/L Final  . Sodium 11/14/2013 137  137 - 147 mmol/L Final  . LDl/HDL Ratio 11/14/2013 4.2   Final  . Triglycerides 11/14/2013 128  40 - 160 mg/dL Final  . Cholesterol 14/78/2956 162  0 - 200 mg/dL Final  . HDL 21/30/8657 39  35 - 70 mg/dL Final  . LDL Cholesterol 11/14/2013 97   Final     Assessment/Plan  1. Bleeding from ear, left Following caretaker thought he was bleeding, we are unable to confirm this today. There is no evidence of active bleeding and there are not any bloody scabs present. He does have bilateral hearing loss and wears a hearing aid bilaterally. She says there was blood on the left hearing aid yesterday. She did clean it off and there has not been any evidence of blood since  then. Patient shows no evidence of tenderness on careful examination of both external auditory canals and tympanic membranes.

## 2014-02-16 ENCOUNTER — Telehealth: Payer: Self-pay | Admitting: *Deleted

## 2014-02-16 DIAGNOSIS — Z0110 Encounter for examination of ears and hearing without abnormal findings: Secondary | ICD-10-CM

## 2014-02-16 NOTE — Telephone Encounter (Signed)
Patient caregiver, Patrick HatchetSheila called and stated that patient would like to have a hearing evaluation because he still cannot hear very well. Needs a referral to [facility] Fax # [phone].  Patient does have an appointment already scheduled for Monday 2/15 but needs a referral from PCP. Please Advise.

## 2014-02-18 NOTE — Telephone Encounter (Signed)
Please go ahead with the referral.

## 2014-02-20 NOTE — Telephone Encounter (Signed)
Referral Placed 

## 2014-02-28 ENCOUNTER — Encounter: Payer: Self-pay | Admitting: Internal Medicine

## 2014-02-28 ENCOUNTER — Non-Acute Institutional Stay: Payer: Medicare Other | Admitting: Internal Medicine

## 2014-02-28 VITALS — BP 116/64 | HR 68

## 2014-02-28 DIAGNOSIS — H9222 Otorrhagia, left ear: Secondary | ICD-10-CM

## 2014-02-28 DIAGNOSIS — E11621 Type 2 diabetes mellitus with foot ulcer: Secondary | ICD-10-CM

## 2014-02-28 DIAGNOSIS — W19XXXD Unspecified fall, subsequent encounter: Secondary | ICD-10-CM

## 2014-02-28 DIAGNOSIS — L97509 Non-pressure chronic ulcer of other part of unspecified foot with unspecified severity: Secondary | ICD-10-CM | POA: Diagnosis not present

## 2014-02-28 DIAGNOSIS — N182 Chronic kidney disease, stage 2 (mild): Secondary | ICD-10-CM | POA: Diagnosis not present

## 2014-02-28 DIAGNOSIS — F0390 Unspecified dementia without behavioral disturbance: Secondary | ICD-10-CM

## 2014-02-28 DIAGNOSIS — I10 Essential (primary) hypertension: Secondary | ICD-10-CM

## 2014-02-28 NOTE — Progress Notes (Signed)
Patient ID: Patrick Murray, male   DOB: 01-Nov-1917, 81 y.o.   MRN: [id number]    [facility]    [facility] ([facility]   Allergies  Allergen Reactions  . Aricept [Donepezil Hcl] Other (See Comments)    Unknown   . Simvastatin Other (See Comments)    Unknown     Chief Complaint  Patient presents with  . Medical Management of Chronic Issues    follow up right big toe and left ear  . Fall    again since last seen, on the 15th and 16th. Caregiver came in apartment and patient was on the floor, she doesn't know how long he was there .  His walker was not near him.     HPI:  Essential hypertension: Blood pressure is actually little bit low today. He has suffered another fall. It is not clear whether low blood pressures may be contributing to risk for falls. He has a balance disorder.  DM type 2 with diabetic foot ulcer: Diabetes seems better controlled and the foot ulcer is healed.   Dementia, without behavioral disturbance: Unchanged  Chronic kidney disease, stage II (mild): Unchanged  Bleeding from ear, left: No further episodes. Resolved.  Fall, subsequent encounter: Found on the floor in his room by his caretaker. Had a hematoma of the left posterior occipital scalp. It is resolving. No other abnormalities noted.    Medications: Patient's Medications  New Prescriptions   No medications on file  Previous Medications   ASPIRIN 81 MG TABLET    Take 81 mg by mouth daily.   CHOLECALCIFEROL (VITAMIN D) 2000 UNITS TABLET    Take 1,000 Units by mouth daily.    DEXTROMETHORPHAN (DELSYM) 30 MG/5ML LIQUID    Take 10 mLs (60 mg total) by mouth as needed for cough.   DIPHENHYDRAMINE (BENADRYL) 25 MG TABLET    One every 6 hours if needed for itching   ENSURE (ENSURE)    Take 237 mLs by mouth. One can daily   GALANTAMINE (RAZADYNE) 8 MG TABLET    TAKE 1 TABLET TWICE DAILY.   GLUCOSAMINE-CHONDROITIN (GLUCOSAMINE CHONDR COMPLEX PO)    Take 1 capsule  by mouth daily.   LOSARTAN (COZAAR) 25 MG TABLET    TAKE 1 TABLET DAILY FOR BLOOD PRESSURE.   PANTOPRAZOLE (PROTONIX) 40 MG TABLET    TAKE 1 TABLET DAILY TO REDUCE STOMACH ACID.   PYRITHIONE ZINC (SELSUN BLUE DRY SCALP) 1 % SHAMPOO    Shampoo at least 3 times weekly   SERTRALINE (ZOLOFT) 50 MG TABLET    TAKE ONE TABLET DAILY TO HELP WITH NERVES.  Modified Medications   No medications on file  Discontinued Medications   No medications on file     Review of Systems  Constitutional: Positive for fever and fatigue. Negative for chills, diaphoresis, activity change, appetite change and unexpected weight change.  HENT: Positive for hearing loss. Negative for congestion, ear pain, facial swelling and nosebleeds.   Eyes: Negative.        Corrective lenses.  Respiratory: Negative for chest tightness and shortness of breath. Cough: dry.   Cardiovascular: Negative for chest pain, palpitations and leg swelling.  Gastrointestinal: Negative for abdominal pain and abdominal distention.       Cough with eating and drinking.  Endocrine: Negative for cold intolerance, heat intolerance, polydipsia, polyphagia and polyuria.       Diabetic.  Genitourinary: Negative.   Musculoskeletal: Positive for myalgias, arthralgias and gait  problem. Negative for neck pain and neck stiffness.  Skin:       Inflamed right great toe and an area medially of ingrown nail that has been filed..  Neurological: Positive for dizziness and weakness. Negative for tremors, seizures, syncope, numbness and headaches.       Balance disturbance. Dementia.  Hematological: Negative.   Psychiatric/Behavioral: Positive for behavioral problems and confusion.    Filed Vitals:   02/28/14 1118  BP: 116/64  Pulse: 68  SpO2: 99%   There is no weight on file to calculate BMI.  Physical Exam  Constitutional:  Frail elderly male  HENT:  Bilateral hearing aids. Loss of hearing. Rusty wax present more prominently in the left external  auditory canal. No bleeding or scabs. Tympanic membranes have some scarring bilaterally but no acute injury.  Eyes: Conjunctivae are normal. Pupils are equal, round, and reactive to light.  Neck: Normal range of motion. Neck supple. No JVD present. No tracheal deviation present. No thyromegaly present.  Cardiovascular: Normal rate and intact distal pulses.  Exam reveals no gallop and no friction rub.   No murmur heard. Atrial fib  Pulmonary/Chest: Effort normal. No respiratory distress. He has no wheezes. He has no rales.  Bronchial rattle.  Abdominal: Soft. Bowel sounds are normal. He exhibits no distension and no mass. There is no tenderness.  Musculoskeletal: Normal range of motion. He exhibits no edema or tenderness.  Poor balance.  Lymphadenopathy:    He has no cervical adenopathy.  Neurological: He is alert. No cranial nerve deficit. Coordination abnormal.  11/22/13 MMSE 17/30. Failed clock drawing.  Skin: Skin is warm and dry. No rash noted. No erythema. No pallor.  Healed ulcer of the left foot at the proximal 5th metatarsal head. Residual scaly area. Resolving hematoma of the left occipital scalp.  Psychiatric: He has a normal mood and affect.  Cheerful. Forgetful.     Labs reviewed: No visits with results within 3 Month(s) from this visit. Latest known visit with results is:  Nursing Home on 11/22/2013  Component Date Value Ref Range Status  . Glucose 11/14/2013 107   Final  . BUN 11/14/2013 37* 4 - 21 mg/dL Final  . Creatinine 30/86/578411/09/2013 2.4* 0.6 - 1.3 mg/dL Final  . Potassium 69/62/952811/09/2013 4.7  3.4 - 5.3 mmol/L Final  . Sodium 11/14/2013 137  137 - 147 mmol/L Final  . LDl/HDL Ratio 11/14/2013 4.2   Final  . Triglycerides 11/14/2013 128  40 - 160 mg/dL Final  . Cholesterol 41/32/440111/09/2013 162  0 - 200 mg/dL Final  . HDL 02/72/536611/09/2013 39  35 - 70 mg/dL Final  . LDL Cholesterol 11/14/2013 97   Final     Assessment/Plan  1. Essential hypertension Stop losartan  2. DM type  2 with diabetic foot ulcer No change in orders  3. Dementia, without behavioral disturbance Stable  4. Chronic kidney disease, stage II (mild) Stable  5. Bleeding from ear, left Resolved  6. Fall, subsequent encounter Stop losartan

## 2014-03-27 LAB — HEPATIC FUNCTION PANEL
ALK PHOS: 103 U/L (ref 25–125)
ALT: 10 U/L (ref 10–40)
AST: 18 U/L (ref 14–40)
BILIRUBIN, TOTAL: 0.7 mg/dL

## 2014-03-27 LAB — LIPID PANEL
Cholesterol: 131 mg/dL (ref 0–200)
HDL: 39 mg/dL (ref 35–70)
LDL Cholesterol: 73 mg/dL
LDl/HDL Ratio: 3.4
TRIGLYCERIDES: 97 mg/dL (ref 40–160)

## 2014-03-27 LAB — HEMOGLOBIN A1C: Hgb A1c MFr Bld: 6.3 % — AB (ref 4.0–6.0)

## 2014-03-27 LAB — BASIC METABOLIC PANEL
BUN: 30 mg/dL — AB (ref 4–21)
Creatinine: 2.4 mg/dL — AB (ref 0.6–1.3)
GLUCOSE: 125 mg/dL
Potassium: 4.7 mmol/L (ref 3.4–5.3)
Sodium: 138 mmol/L (ref 137–147)

## 2014-04-04 ENCOUNTER — Encounter: Payer: Self-pay | Admitting: Internal Medicine

## 2014-04-04 ENCOUNTER — Non-Acute Institutional Stay: Payer: Medicare Other | Admitting: Internal Medicine

## 2014-04-04 VITALS — BP 142/78 | HR 84 | Temp 97.9°F

## 2014-04-04 DIAGNOSIS — H9193 Unspecified hearing loss, bilateral: Secondary | ICD-10-CM | POA: Diagnosis not present

## 2014-04-04 DIAGNOSIS — E1142 Type 2 diabetes mellitus with diabetic polyneuropathy: Secondary | ICD-10-CM

## 2014-04-04 DIAGNOSIS — N182 Chronic kidney disease, stage 2 (mild): Secondary | ICD-10-CM | POA: Diagnosis not present

## 2014-04-04 DIAGNOSIS — W19XXXD Unspecified fall, subsequent encounter: Secondary | ICD-10-CM

## 2014-04-04 DIAGNOSIS — R58 Hemorrhage, not elsewhere classified: Secondary | ICD-10-CM

## 2014-04-04 DIAGNOSIS — H9190 Unspecified hearing loss, unspecified ear: Secondary | ICD-10-CM | POA: Insufficient documentation

## 2014-04-04 DIAGNOSIS — E785 Hyperlipidemia, unspecified: Secondary | ICD-10-CM

## 2014-04-04 DIAGNOSIS — F0390 Unspecified dementia without behavioral disturbance: Secondary | ICD-10-CM | POA: Diagnosis not present

## 2014-04-04 DIAGNOSIS — R2681 Unsteadiness on feet: Secondary | ICD-10-CM | POA: Diagnosis not present

## 2014-04-04 DIAGNOSIS — I10 Essential (primary) hypertension: Secondary | ICD-10-CM | POA: Diagnosis not present

## 2014-04-04 HISTORY — DX: Hemorrhage, not elsewhere classified: R58

## 2014-04-04 NOTE — Progress Notes (Signed)
Patient ID: Patrick Murray, male   DOB: Jun 25, 1917, 81 y.o.   MRN: [id number]    [facility]     Place of Service: Clinic (12)    Allergies  Allergen Reactions  . Aricept [Donepezil Hcl] Other (See Comments)    Unknown   . Simvastatin Other (See Comments)    Unknown     Chief Complaint  Patient presents with  . Medical Management of Chronic Issues    blood sugar, blood pressure, dementia, History of falls  . Fall    patient was found on bedroom floor 04/02/14, has large bruise right eye  . Audiology testing    03/09/14 [facility] recommend ENT consult moderate to severe sensorineural hearing loss left; severe to profound hearing loss right    HPI:  Type 2 diabetes mellitus with diabetic polyneuropathy: controlled  Essential hypertension: most are in normal range although some SBP are high  Chronic kidney disease, stage II (mild): stable  Gait instability: unchanged and worse  Fall, subsequent encounter: fell again 04/02/14 in his room  Hyperlipemia: controlled  Hearing loss, bilateral: has seen audiologist. Hearing loss is severe. Wears bilateral aids.  Ecchymosis: Large and right sided    Medications: Patient's Medications  New Prescriptions   No medications on file  Previous Medications   ASPIRIN 81 MG TABLET    Take 81 mg by mouth daily.   CHOLECALCIFEROL (VITAMIN D) 2000 UNITS TABLET    Take 1,000 Units by mouth daily.    DEXTROMETHORPHAN (DELSYM) 30 MG/5ML LIQUID    Take 10 mLs (60 mg total) by mouth as needed for cough.   ENSURE (ENSURE)    Take 237 mLs by mouth. One can daily   GALANTAMINE (RAZADYNE) 8 MG TABLET    TAKE 1 TABLET TWICE DAILY.   GLUCOSAMINE-CHONDROITIN (GLUCOSAMINE CHONDR COMPLEX PO)    Take 1 capsule by mouth daily.   PANTOPRAZOLE (PROTONIX) 40 MG TABLET    TAKE 1 TABLET DAILY TO REDUCE STOMACH ACID.   PYRITHIONE ZINC (SELSUN BLUE DRY SCALP) 1 % SHAMPOO    Shampoo at least 3 times weekly   SALINE NASAL SPRAY NA    Place  into the nose. One spray in each nostril as needed   SERTRALINE (ZOLOFT) 50 MG TABLET    TAKE ONE TABLET DAILY TO HELP WITH NERVES.  Modified Medications   No medications on file  Discontinued Medications   DIPHENHYDRAMINE (BENADRYL) 25 MG TABLET    One every 6 hours if needed for itching     Review of Systems  Constitutional: Positive for fever and fatigue. Negative for chills, diaphoresis, activity change, appetite change and unexpected weight change.  HENT: Positive for hearing loss. Negative for congestion, ear pain, facial swelling and nosebleeds.   Eyes: Negative.        Corrective lenses.  Respiratory: Negative for chest tightness and shortness of breath. Cough: dry.   Cardiovascular: Negative for chest pain, palpitations and leg swelling.  Gastrointestinal: Negative for abdominal pain and abdominal distention.       Cough with eating and drinking.  Endocrine: Negative for cold intolerance, heat intolerance, polydipsia, polyphagia and polyuria.       Diabetic.  Genitourinary: Negative.   Musculoskeletal: Positive for myalgias, arthralgias and gait problem. Negative for neck pain and neck stiffness.  Skin:       Inflamed right great toe and an area medially of ingrown nail that has been filed..  Neurological: Positive for dizziness and weakness. Negative for  tremors, seizures, syncope, numbness and headaches.       Balance disturbance. Dementia.  Hematological: Negative.   Psychiatric/Behavioral: Positive for behavioral problems and confusion.    Filed Vitals:   04/04/14 0923  BP: 142/78  Pulse: 84  Temp: 97.9 F (36.6 C)  TempSrc: Oral  SpO2: 99%   Body mass index is 0.00 kg/(m^2).  Physical Exam  Constitutional:  Frail elderly male  HENT:  Bilateral hearing aids. Loss of hearing. Rusty wax present more prominently in the left external auditory canal. No bleeding or scabs. Tympanic membranes have some scarring bilaterally but no acute injury.  Eyes: Conjunctivae  are normal. Pupils are equal, round, and reactive to light.  Neck: Normal range of motion. Neck supple. No JVD present. No tracheal deviation present. No thyromegaly present.  Deviated spine of the nose.  Cardiovascular: Normal rate and intact distal pulses.  Exam reveals no gallop and no friction rub.   No murmur heard. Atrial fib Diminished DP and PT bilaterally  Pulmonary/Chest: Effort normal. No respiratory distress. He has no wheezes. He has no rales.  Bronchial rattle.  Abdominal: Soft. Bowel sounds are normal. He exhibits no distension and no mass. There is no tenderness.  Musculoskeletal: Normal range of motion. He exhibits no edema or tenderness.  Poor balance.  Lymphadenopathy:    He has no cervical adenopathy.  Neurological: He is alert. No cranial nerve deficit. Coordination abnormal.  11/22/13 MMSE 17/30. Failed clock drawing. Diminished sensation to vibration and to monofilament testing bilaterally.  Skin: Skin is warm and dry. No rash noted. No erythema. No pallor.  Healed ulcer of the left foot at the proximal 5th metatarsal head. Residual scaly area. Large ecchymosis involving the right periorbital area, right neck, right postauricular area. Other areas of bruising include the right shoulder and right hand.  Psychiatric: He has a normal mood and affect.  Cheerful. Forgetful.     Labs reviewed: Nursing Home on 04/04/2014  Component Date Value Ref Range Status  . Glucose 03/27/2014 125   Final  . BUN 03/27/2014 30* 4 - 21 mg/dL Final  . Creatinine 40/98/1191 2.4* 0.6 - 1.3 mg/dL Final  . Potassium 47/82/9562 4.7  3.4 - 5.3 mmol/L Final  . Sodium 03/27/2014 138  137 - 147 mmol/L Final  . LDl/HDL Ratio 03/27/2014 3.4   Final  . Triglycerides 03/27/2014 97  40 - 160 mg/dL Final  . Cholesterol 13/08/6576 131  0 - 200 mg/dL Final  . HDL 46/96/2952 39  35 - 70 mg/dL Final  . LDL Cholesterol 03/27/2014 73   Final  . Alkaline Phosphatase 03/27/2014 103  25 - 125 U/L Final   . ALT 03/27/2014 10  10 - 40 U/L Final  . AST 03/27/2014 18  14 - 40 U/L Final  . Bilirubin, Total 03/27/2014 0.7   Final  . Hgb A1c MFr Bld 03/27/2014 6.3* 4.0 - 6.0 % Final     Assessment/Plan  1. Type 2 diabetes mellitus with diabetic polyneuropathy controlled  2. Essential hypertension Controlled  3. Chronic kidney disease, stage II (mild) Controlled  4. Gait instability Unchanged  5. Fall, subsequent encounter Significant ecchymoses and contusions to the right side of the body from the eye, right postauricular area, neck, shoulder, and hand.  6. Hyperlipemia Controlled  7. Hearing loss, bilateral Severe, bilaterally.  8. Ecchymosis Large on the right side of the body  9. Dementia, without behavioral disturbance Worsening. Some wandering at night. Seems more difficult for him to understand  what is being asked of him. Loss of hearing may compound the problem.

## 2014-04-05 ENCOUNTER — Telehealth: Payer: Self-pay

## 2014-04-05 NOTE — Telephone Encounter (Signed)
Message left on triage voicemail: Since office visit yesterday patient is feeling more off, not thinking clearly, and very tired. Shelia questions if patient should have a neurology referral.  Please advise

## 2014-04-06 MED ORDER — AMBULATORY NON FORMULARY MEDICATION: Status: DC

## 2014-04-06 NOTE — Telephone Encounter (Signed)
I don't think this is necessary. Please call [facility] nurse and arrange for patient to have CBC, CMP, urinalysis, and TSH. Diagnosis for this would be change in mental status and weakness.

## 2014-04-06 NOTE — Telephone Encounter (Signed)
I called Patrick Murray and discussed Dr.Greens response. [facility] is located a [facility]. [facility]

## 2014-04-10 ENCOUNTER — Encounter: Payer: Self-pay | Admitting: *Deleted

## 2014-04-10 LAB — CBC AND DIFFERENTIAL
HCT: 32 % — AB (ref 41–53)
HEMOGLOBIN: 10.6 g/dL — AB (ref 13.5–17.5)
PLATELETS: 150 10*3/uL (ref 150–399)
WBC: 6.5 10^3/mL

## 2014-04-10 LAB — BASIC METABOLIC PANEL
BUN: 31 mg/dL — AB (ref 4–21)
CREATININE: 2.4 mg/dL — AB (ref 0.6–1.3)
Glucose: 127 mg/dL
POTASSIUM: 4.4 mmol/L (ref 3.4–5.3)
Sodium: 137 mmol/L (ref 137–147)

## 2014-04-10 LAB — HEPATIC FUNCTION PANEL
ALT: 10 U/L (ref 10–40)
AST: 12 U/L — AB (ref 14–40)
Alkaline Phosphatase: 106 U/L (ref 25–125)
Bilirubin, Total: 1.1 mg/dL

## 2014-04-10 LAB — TSH: TSH: 1.48 u[IU]/mL (ref 0.41–5.90)

## 2014-04-13 ENCOUNTER — Telehealth: Payer: Self-pay | Admitting: *Deleted

## 2014-04-13 NOTE — Telephone Encounter (Signed)
LMOM to return call.

## 2014-04-13 NOTE — Telephone Encounter (Signed)
Caregiver called and stated that patient has some places on his arm that are itching. Doesn't want to use Benadryl. Wants to know if there is a cream they can try for this. Please Advise.

## 2014-04-13 NOTE — Telephone Encounter (Signed)
Benadryl is available as a cream that could be used. Alternatively can get 1% hydrocortisone cream over-the-counter and use that

## 2014-04-18 NOTE — Telephone Encounter (Signed)
Patrick HatchetSheila Notified and agreed. She will call is any changes occur or if it worsens.

## 2014-04-19 ENCOUNTER — Inpatient Hospital Stay (HOSPITAL_COMMUNITY)
Admission: EM | Admit: 2014-04-19 | Discharge: 2014-04-22 | DRG: 083 | Disposition: A | Discharge status: Skilled Nursing Facility | Payer: Medicare Other | Attending: Internal Medicine | Admitting: Internal Medicine

## 2014-04-19 ENCOUNTER — Encounter (HOSPITAL_COMMUNITY): Payer: Self-pay | Admitting: Emergency Medicine

## 2014-04-19 ENCOUNTER — Emergency Department (HOSPITAL_COMMUNITY): Payer: Medicare Other

## 2014-04-19 DIAGNOSIS — I739 Peripheral vascular disease, unspecified: Secondary | ICD-10-CM | POA: Diagnosis present

## 2014-04-19 DIAGNOSIS — Z7982 Long term (current) use of aspirin: Secondary | ICD-10-CM

## 2014-04-19 DIAGNOSIS — E785 Hyperlipidemia, unspecified: Secondary | ICD-10-CM | POA: Diagnosis present

## 2014-04-19 DIAGNOSIS — I482 Chronic atrial fibrillation, unspecified: Secondary | ICD-10-CM

## 2014-04-19 DIAGNOSIS — S065XAA Traumatic subdural hemorrhage with loss of consciousness status unknown, initial encounter: Secondary | ICD-10-CM | POA: Diagnosis present

## 2014-04-19 DIAGNOSIS — E1122 Type 2 diabetes mellitus with diabetic chronic kidney disease: Secondary | ICD-10-CM | POA: Diagnosis present

## 2014-04-19 DIAGNOSIS — F329 Major depressive disorder, single episode, unspecified: Secondary | ICD-10-CM | POA: Diagnosis present

## 2014-04-19 DIAGNOSIS — W19XXXA Unspecified fall, initial encounter: Secondary | ICD-10-CM | POA: Diagnosis present

## 2014-04-19 DIAGNOSIS — I951 Orthostatic hypotension: Secondary | ICD-10-CM | POA: Diagnosis present

## 2014-04-19 DIAGNOSIS — I495 Sick sinus syndrome: Secondary | ICD-10-CM | POA: Diagnosis present

## 2014-04-19 DIAGNOSIS — Z888 Allergy status to other drugs, medicaments and biological substances status: Secondary | ICD-10-CM | POA: Diagnosis not present

## 2014-04-19 DIAGNOSIS — R2681 Unsteadiness on feet: Secondary | ICD-10-CM

## 2014-04-19 DIAGNOSIS — Z950 Presence of cardiac pacemaker: Secondary | ICD-10-CM

## 2014-04-19 DIAGNOSIS — I4891 Unspecified atrial fibrillation: Secondary | ICD-10-CM | POA: Diagnosis not present

## 2014-04-19 DIAGNOSIS — R296 Repeated falls: Secondary | ICD-10-CM | POA: Diagnosis present

## 2014-04-19 DIAGNOSIS — N184 Chronic kidney disease, stage 4 (severe): Secondary | ICD-10-CM | POA: Diagnosis not present

## 2014-04-19 DIAGNOSIS — Z66 Do not resuscitate: Secondary | ICD-10-CM | POA: Diagnosis present

## 2014-04-19 DIAGNOSIS — Z87891 Personal history of nicotine dependence: Secondary | ICD-10-CM

## 2014-04-19 DIAGNOSIS — I129 Hypertensive chronic kidney disease with stage 1 through stage 4 chronic kidney disease, or unspecified chronic kidney disease: Secondary | ICD-10-CM | POA: Diagnosis present

## 2014-04-19 DIAGNOSIS — F0390 Unspecified dementia without behavioral disturbance: Secondary | ICD-10-CM | POA: Diagnosis present

## 2014-04-19 DIAGNOSIS — W19XXXD Unspecified fall, subsequent encounter: Secondary | ICD-10-CM | POA: Diagnosis not present

## 2014-04-19 DIAGNOSIS — S065X9A Traumatic subdural hemorrhage with loss of consciousness of unspecified duration, initial encounter: Secondary | ICD-10-CM | POA: Diagnosis not present

## 2014-04-19 DIAGNOSIS — H9190 Unspecified hearing loss, unspecified ear: Secondary | ICD-10-CM | POA: Diagnosis present

## 2014-04-19 DIAGNOSIS — Z9049 Acquired absence of other specified parts of digestive tract: Secondary | ICD-10-CM | POA: Diagnosis present

## 2014-04-19 DIAGNOSIS — R52 Pain, unspecified: Secondary | ICD-10-CM

## 2014-04-19 DIAGNOSIS — I6200 Nontraumatic subdural hemorrhage, unspecified: Secondary | ICD-10-CM | POA: Diagnosis not present

## 2014-04-19 HISTORY — DX: Traumatic subdural hemorrhage with loss of consciousness of unspecified duration, initial encounter: S06.5X9A

## 2014-04-19 HISTORY — DX: Traumatic subdural hemorrhage with loss of consciousness status unknown, initial encounter: S06.5XAA

## 2014-04-19 LAB — CBC WITH DIFFERENTIAL/PLATELET
Basophils Absolute: 0 10*3/uL (ref 0.0–0.1)
Basophils Relative: 0 % (ref 0–1)
Eosinophils Absolute: 0.1 10*3/uL (ref 0.0–0.7)
Eosinophils Relative: 1 % (ref 0–5)
HEMATOCRIT: 35.6 % — AB (ref 39.0–52.0)
HEMOGLOBIN: 11.6 g/dL — AB (ref 13.0–17.0)
Lymphocytes Relative: 11 % — ABNORMAL LOW (ref 12–46)
Lymphs Abs: 0.8 10*3/uL (ref 0.7–4.0)
MCH: 30.9 pg (ref 26.0–34.0)
MCHC: 32.6 g/dL (ref 30.0–36.0)
MCV: 94.7 fL (ref 78.0–100.0)
MONO ABS: 0.6 10*3/uL (ref 0.1–1.0)
MONOS PCT: 9 % (ref 3–12)
Neutro Abs: 5.3 10*3/uL (ref 1.7–7.7)
Neutrophils Relative %: 79 % — ABNORMAL HIGH (ref 43–77)
Platelets: 140 10*3/uL — ABNORMAL LOW (ref 150–400)
RBC: 3.76 MIL/uL — AB (ref 4.22–5.81)
RDW: 13.5 % (ref 11.5–15.5)
WBC: 6.7 10*3/uL (ref 4.0–10.5)

## 2014-04-19 LAB — COMPREHENSIVE METABOLIC PANEL
ALBUMIN: 3.8 g/dL (ref 3.5–5.2)
ALT: 13 U/L (ref 0–53)
AST: 17 U/L (ref 0–37)
Alkaline Phosphatase: 106 U/L (ref 39–117)
Anion gap: 9 (ref 5–15)
BUN: 29 mg/dL — AB (ref 6–23)
CALCIUM: 8.8 mg/dL (ref 8.4–10.5)
CO2: 23 mmol/L (ref 19–32)
Chloride: 106 mmol/L (ref 96–112)
Creatinine, Ser: 2.38 mg/dL — ABNORMAL HIGH (ref 0.50–1.35)
GFR calc non Af Amer: 22 mL/min — ABNORMAL LOW (ref >90)
GFR, EST AFRICAN AMERICAN: 25 mL/min — AB (ref >90)
GLUCOSE: 133 mg/dL — AB (ref 70–99)
Potassium: 4.5 mmol/L (ref 3.5–5.1)
Sodium: 138 mmol/L (ref 135–145)
TOTAL PROTEIN: 6.9 g/dL (ref 6.0–8.3)
Total Bilirubin: 1.4 mg/dL — ABNORMAL HIGH (ref 0.3–1.2)

## 2014-04-19 LAB — URINALYSIS, ROUTINE W REFLEX MICROSCOPIC
BILIRUBIN URINE: NEGATIVE
GLUCOSE, UA: NEGATIVE mg/dL
Hgb urine dipstick: NEGATIVE
Ketones, ur: NEGATIVE mg/dL
Leukocytes, UA: NEGATIVE
NITRITE: NEGATIVE
PH: 5.5 (ref 5.0–8.0)
Protein, ur: 30 mg/dL — AB
Specific Gravity, Urine: 1.017 (ref 1.005–1.030)
Urobilinogen, UA: 1 mg/dL (ref 0.0–1.0)

## 2014-04-19 LAB — URINE MICROSCOPIC-ADD ON

## 2014-04-19 LAB — CK: Total CK: 61 U/L (ref 7–232)

## 2014-04-19 LAB — TROPONIN I: Troponin I: 0.03 ng/mL (ref <0.031)

## 2014-04-19 MED ORDER — ONDANSETRON HCL 4 MG PO TABS: 4.0000 mg | ORAL_TABLET | Freq: Four times a day (QID) | ORAL | Status: DC | PRN

## 2014-04-19 MED ORDER — GLUCOSAMINE-CHONDROITIN 500-400 MG PO CAPS: ORAL_CAPSULE | Freq: Every day | ORAL | Status: DC

## 2014-04-19 MED ORDER — SODIUM CHLORIDE 0.9 % IV SOLN
INTRAVENOUS | Status: DC
  Administered 2014-04-19 – 2014-04-20 (×3): via INTRAVENOUS

## 2014-04-19 MED ORDER — ALUM & MAG HYDROXIDE-SIMETH 200-200-20 MG/5ML PO SUSP: 30.0000 mL | Freq: Four times a day (QID) | ORAL | Status: DC | PRN

## 2014-04-19 MED ORDER — PANTOPRAZOLE SODIUM 40 MG PO TBEC
40.0000 mg | DELAYED_RELEASE_TABLET | Freq: Every day | ORAL | Status: DC
  Administered 2014-04-19 – 2014-04-22 (×4): 40 mg via ORAL
  Filled 2014-04-19 (×4): qty 1

## 2014-04-19 MED ORDER — ONDANSETRON HCL 4 MG/2ML IJ SOLN: 4.0000 mg | Freq: Four times a day (QID) | INTRAMUSCULAR | Status: DC | PRN

## 2014-04-19 MED ORDER — ACETAMINOPHEN 325 MG PO TABS: 650.0000 mg | ORAL_TABLET | Freq: Four times a day (QID) | ORAL | Status: DC | PRN

## 2014-04-19 MED ORDER — SODIUM CHLORIDE 0.9 % IV BOLUS (SEPSIS)
500.0000 mL | Freq: Once | INTRAVENOUS | Status: AC
  Administered 2014-04-19: 500 mL via INTRAVENOUS

## 2014-04-19 MED ORDER — GALANTAMINE HYDROBROMIDE 4 MG PO TABS
8.0000 mg | ORAL_TABLET | Freq: Two times a day (BID) | ORAL | Status: DC
  Administered 2014-04-19 – 2014-04-22 (×6): 8 mg via ORAL
  Filled 2014-04-19 (×7): qty 2

## 2014-04-19 MED ORDER — VITAMIN D3 25 MCG (1000 UNIT) PO TABS
1000.0000 [IU] | ORAL_TABLET | Freq: Every day | ORAL | Status: DC
  Administered 2014-04-19 – 2014-04-22 (×4): 1000 [IU] via ORAL
  Filled 2014-04-19 (×4): qty 1

## 2014-04-19 MED ORDER — ACETAMINOPHEN 650 MG RE SUPP: 650.0000 mg | Freq: Four times a day (QID) | RECTAL | Status: DC | PRN

## 2014-04-19 MED ORDER — SERTRALINE HCL 50 MG PO TABS
50.0000 mg | ORAL_TABLET | Freq: Every day | ORAL | Status: DC
  Administered 2014-04-19 – 2014-04-22 (×4): 50 mg via ORAL
  Filled 2014-04-19 (×4): qty 1

## 2014-04-19 MED ORDER — BISACODYL 10 MG RE SUPP: 10.0000 mg | Freq: Every day | RECTAL | Status: DC | PRN

## 2014-04-19 MED ORDER — POLYETHYLENE GLYCOL 3350 17 G PO PACK: 17.0000 g | PACK | Freq: Every day | ORAL | Status: DC | PRN

## 2014-04-19 MED ORDER — DEXTROMETHORPHAN POLISTIREX 30 MG/5ML PO LQCR
60.0000 mg | Freq: Two times a day (BID) | ORAL | Status: DC | PRN
  Filled 2014-04-19: qty 10

## 2014-04-19 NOTE — ED Notes (Signed)
Bed: WA23 Expected date:  Expected time:  Means of arrival:  Comments: fall 

## 2014-04-19 NOTE — H&P (Signed)
Patient Demographics  Patrick Murray, is a 81 y.o. male  MRN: [id number]   DOB - [birth year]  Admit Date - 04/19/2014  Outpatient Primary MD for the patient is GREEN, Lenon Curt, MD   With History of -  Past Medical History  Diagnosis Date  . Cardiac pacemaker in situ     a.fib w/slow ventricular response  . Atrial fibrillation   . Myalgia and myositis, unspecified   . Unspecified essential hypertension   . Other and unspecified hyperlipidemia   . Sinoatrial node dysfunction   . Acute posthemorrhagic anemia   . Depression   . Gastric ulcer   . Dementia   . Vitamin D deficiency   . Gait disorder   . Chronic kidney disease, stage II (mild)   . Acute gastric ulcer with hemorrhage, without mention of obstruction   . Other malaise and fatigue   . Edema   . Personal history of fall   . Hemorrhage of gastrointestinal tract, unspecified 10/13/11  . Muscle weakness (generalized)   . Anemia, unspecified   . Other specified disease of sebaceous glands     xerosis cutis  . Unspecified pruritic disorder   . Unspecified urinary incontinence 01/01/10  . Unspecified hearing loss   . Peripheral vascular disease, unspecified   . Syncope and collapse 08/08/2008  . Abrasion of face   . Cough   . Type II or unspecified type diabetes mellitus with renal manifestations, not stated as uncontrolled       Past Surgical History  Procedure Laterality Date  . No past surgeries    . Esophagogastroduodenoscopy  10/08/2011    Procedure: ESOPHAGOGASTRODUODENOSCOPY (EGD);  Surgeon: Willis Modena, MD;  Location: [facility];  Service: Endoscopy;  Laterality: N/A;  . Pacemaker generator change  08/05/07    [facility]  . Pacemaker insertion  2003  . Appendectomy  1926  . Cholecystectomy  1956  . Tonsillectomy  1926    in for   Chief Complaint  Patient presents with  . Fall      HPI  Patrick Murray  is a 81 y.o. male, with history of advanced dementia, sick sinus syndrome status  post permanent pacemaker, atrial fibrillation not on anticoagulation secondary to multiple falls and history of GI bleed, presents with fall, patient lives by himself on Independent living facility with caretaker during daytime, was found on the morning sitting on the floor by his caretaker at 7:00 a.m., patient was found to have multiple bruising in the chest and the neck and face area ( caretaker reports this is secondary to previous fall a few weeks ago), patient had CT head without contrast was significant for minimal subdural hematoma, isn't extremely poor historian can't give any reliable history, does not recall how or when he fell, ED discussed with neurosurgery who didn't recommend any workup at this point, hospitalist requested to admit the patient.    Review of Systems    In addition to the HPI above, patient has advanced dementia, very poor historian, his review of system is unreliable.     Social History History  Substance Use Topics  . Smoking status: Former Smoker -- 5 years    Types: Pipe  . Smokeless tobacco: Never Used  . Alcohol Use: No     Family History Family History  Problem Relation Age of Onset  . Heart attack Mother     had a couple  . Heart failure Mother   . Emphysema Father   .  Cancer Sister     lung     Prior to Admission medications   Medication Sig Start Date End Date Taking? Authorizing Provider  aspirin 81 MG tablet Take 81 mg by mouth daily.   Yes Historical Provider, MD  Cholecalciferol (VITAMIN D) 2000 UNITS tablet Take 1,000 Units by mouth daily.    Yes Historical Provider, MD  dextromethorphan (DELSYM) 30 MG/5ML liquid Take 10 mLs (60 mg total) by mouth as needed for cough. 04/19/13  Yes Kimber Relic, MD  ENSURE (ENSURE) Take 237 mLs by mouth. One can daily   Yes Historical Provider, MD  galantamine (RAZADYNE) 8 MG tablet TAKE 1 TABLET TWICE DAILY. Patient taking differently: take two tablets every morning 02/07/14  Yes Kimber Relic, MD    Glucosamine-Chondroitin (GLUCOSAMINE CHONDR COMPLEX PO) Take 1 capsule by mouth daily.   Yes Historical Provider, MD  pantoprazole (PROTONIX) 40 MG tablet TAKE 1 TABLET DAILY TO REDUCE STOMACH ACID. 10/31/13  Yes Kimber Relic, MD  pyrithione zinc (SELSUN BLUE DRY SCALP) 1 % shampoo Shampoo at least 3 times weekly 11/22/13  Yes Kimber Relic, MD  SALINE NASAL SPRAY NA Place into the nose. One spray in each nostril as needed   Yes Historical Provider, MD  sertraline (ZOLOFT) 50 MG tablet TAKE ONE TABLET DAILY TO HELP WITH NERVES. 02/07/14  Yes Kimber Relic, MD    Allergies  Allergen Reactions  . Aricept [Donepezil Hcl] Other (See Comments)    Unknown   . Simvastatin Other (See Comments)    Unknown     Physical Exam  Vitals  Blood pressure 156/69, pulse 67, temperature 97.3 F (36.3 C), temperature source Oral, resp. rate 20, SpO2 100 %.   1. General frail elderly male lying in bed in NAD,    2. Flat affect, confused, oriented to name only.  3. No F.N deficits, ALL C.Nerves Intact, Strength grossly intact all 4 extremities,  Plantars down going.  4. Ears and Eyes appear Normal, Conjunctivae clear, PERRLA. Moist Oral Mucosa.  5. Supple Neck, No JVD, No cervical lymphadenopathy appriciated, No Carotid Bruits.  6. Symmetrical Chest wall movement, Good air movement bilaterally, CTAB.  7. , No Gallops, Rubs or Murmurs, No Parasternal Heave.  8. Positive Bowel Sounds, Abdomen Soft, No tenderness, No organomegaly appriciated,No rebound -guarding or rigidity.  9.  No Cyanosis, Normal Skin Turgor, No Skin Rash , agent has significant bruising of the anterior chest area, neck area, and right face  10. Good muscle tone,  joints appear normal , no effusions,     Data Review  CBC  Recent Labs Lab 04/19/14 0917  WBC 6.7  HGB 11.6*  HCT 35.6*  PLT 140*  MCV 94.7  MCH 30.9  MCHC 32.6  RDW 13.5  LYMPHSABS 0.8  MONOABS 0.6  EOSABS 0.1  BASOSABS 0.0    ------------------------------------------------------------------------------------------------------------------  Chemistries   Recent Labs Lab 04/19/14 0917  NA 138  K 4.5  CL 106  CO2 23  GLUCOSE 133*  BUN 29*  CREATININE 2.38*  CALCIUM 8.8  AST 17  ALT 13  ALKPHOS 106  BILITOT 1.4*   ------------------------------------------------------------------------------------------------------------------ CrCl cannot be calculated (Unknown ideal weight.). ------------------------------------------------------------------------------------------------------------------ No results for input(s): TSH, T4TOTAL, T3FREE, THYROIDAB in the last 72 hours.  Invalid input(s): FREET3   Coagulation profile No results for input(s): INR, PROTIME in the last 168 hours. ------------------------------------------------------------------------------------------------------------------- No results for input(s): DDIMER in the last 72 hours. -------------------------------------------------------------------------------------------------------------------  Cardiac Enzymes  Recent Labs Lab  04/19/14 0917  TROPONINI 0.03   ------------------------------------------------------------------------------------------------------------------ Invalid input(s): POCBNP   ---------------------------------------------------------------------------------------------------------------  Urinalysis    Component Value Date/Time   COLORURINE YELLOW 04/19/2014 0929   APPEARANCEUR CLEAR 04/19/2014 0929   LABSPEC 1.017 04/19/2014 0929   PHURINE 5.5 04/19/2014 0929   GLUCOSEU NEGATIVE 04/19/2014 0929   HGBUR NEGATIVE 04/19/2014 0929   BILIRUBINUR NEGATIVE 04/19/2014 0929   KETONESUR NEGATIVE 04/19/2014 0929   PROTEINUR 30* 04/19/2014 0929   UROBILINOGEN 1.0 04/19/2014 0929   NITRITE NEGATIVE 04/19/2014 0929   LEUKOCYTESUR NEGATIVE 04/19/2014 0929     ----------------------------------------------------------------------------------------------------------------  Imaging results:   Dg Lumbar Spine Complete  04/19/2014   CLINICAL DATA:  81 year old male with unwitnessed fall. Bruising. Initial encounter.  EXAM: LUMBAR SPINE - COMPLETE 4+ VIEW  COMPARISON:  None.  FINDINGS: Normal lumbar segmentation. Partially visible cardiac pacemaker lead. Suture material in the mid abdomen. Severe or complete disc space loss at L4-L5, there may be interbody ankylosis. There is moderate to severe facet hypertrophy from L3 to the sacrum. Bulky endplate osteophytes elsewhere in the lumbar spine with relatively preserved disc spaces. No compression fracture. Both SI joints may be ankylosed. Aortoiliac calcified atherosclerosis noted. Grossly intact visible lower thoracic levels.  IMPRESSION: 1.  No acute fracture or listhesis identified in the lumbar spine. 2. 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   Electronically Signed   By: Odessa Fleming M.D.   On: 04/19/2014 10:24   Dg Pelvis 1-2 Views  04/19/2014   CLINICAL DATA:  Unwitnessed fall, history dementia, bruising to face and neck  EXAM: PELVIS - 1-2 VIEW  COMPARISON:  None  FINDINGS: Osseous demineralization.  Mild narrowing of the hip joints bilaterally.  SI joints poorly visualized due to degree of demineralization.  No acute fracture, dislocation or bone destruction.  Degenerative disc disease changes at visualized lower lumbar spine.  Scattered atherosclerotic calcifications.  IMPRESSION: Osseous demineralization.  No acute abnormalities.   Electronically Signed   By: Ulyses Southward M.D.   On: 04/19/2014 10:25   Ct Head Wo Contrast  04/19/2014   CLINICAL DATA:  Fall, dementia  EXAM: CT HEAD WITHOUT CONTRAST  CT CERVICAL SPINE WITHOUT CONTRAST  TECHNIQUE: Multidetector CT imaging of the head and cervical spine was performed following the standard protocol without intravenous contrast. Multiplanar  CT image reconstructions of the cervical spine were also generated.  COMPARISON:  09/01/2012  FINDINGS: CT HEAD FINDINGS  No skull fracture is noted. Again noted central heterogeneous opacification left maxillary sinus. Thickening of the lateral wall of left maxillary sinus is stable. Stable cystic changes in left sphenoid bone.  There is new subdural collection along left hemisphere which is isodense measures about 9 mm thickness. Only small amount of anterior increased attenuation axial image 20 probable subacute blood products. No significant mass effect or midline shift. Stable cerebral atrophy. Stable periventricular and patchy subcortical chronic white matter disease. No definite acute cortical infarction. No mass lesion is noted.  CT CERVICAL SPINE FINDINGS  Axial images of the cervical spine shows no acute fracture or or subluxation. Atherosclerotic calcification bilateral carotid bifurcation. Computer processed images shows no acute fracture or subluxation. There is significant disc space flattening at C4-C5 level. Mild disc space flattening at C2-C3 and C5-C6 level. Mild disc space flattening at C6-C7 and C7-T1 level. Large anterior bridging osteophytes at C2-C3 C3-C4 and C4-C5 level. Degenerative changes C1-C2 articulation. Multilevel facet degenerative changes. There is no pneumothorax in visualized lung apices.  IMPRESSION: 1. There is  new isodense subdural left hemispheric collection which measures 9 mm maximum thickness. Only small amount of subacute blood products anteriorly see axial image 20. No mass effect or midline shift. Stable atrophy and chronic white matter disease. No definite acute cortical infarction. No mass lesion is noted on this unenhanced scan 2. No cervical spine acute fracture or subluxation. Degenerative changes as described above. These results were called by telephone at the time of interpretation on 04/19/2014 at 10:15 am to Dr. Bethann BerkshireJOSEPH ZAMMIT , who verbally acknowledged these  results.   Electronically Signed   By: Natasha MeadLiviu  Pop M.D.   On: 04/19/2014 10:15   Ct Cervical Spine Wo Contrast  04/19/2014   CLINICAL DATA:  Fall, dementia  EXAM: CT HEAD WITHOUT CONTRAST  CT CERVICAL SPINE WITHOUT CONTRAST  TECHNIQUE: Multidetector CT imaging of the head and cervical spine was performed following the standard protocol without intravenous contrast. Multiplanar CT image reconstructions of the cervical spine were also generated.  COMPARISON:  09/01/2012  FINDINGS: CT HEAD FINDINGS  No skull fracture is noted. Again noted central heterogeneous opacification left maxillary sinus. Thickening of the lateral wall of left maxillary sinus is stable. Stable cystic changes in left sphenoid bone.  There is new subdural collection along left hemisphere which is isodense measures about 9 mm thickness. Only small amount of anterior increased attenuation axial image 20 probable subacute blood products. No significant mass effect or midline shift. Stable cerebral atrophy. Stable periventricular and patchy subcortical chronic white matter disease. No definite acute cortical infarction. No mass lesion is noted.  CT CERVICAL SPINE FINDINGS  Axial images of the cervical spine shows no acute fracture or or subluxation. Atherosclerotic calcification bilateral carotid bifurcation. Computer processed images shows no acute fracture or subluxation. There is significant disc space flattening at C4-C5 level. Mild disc space flattening at C2-C3 and C5-C6 level. Mild disc space flattening at C6-C7 and C7-T1 level. Large anterior bridging osteophytes at C2-C3 C3-C4 and C4-C5 level. Degenerative changes C1-C2 articulation. Multilevel facet degenerative changes. There is no pneumothorax in visualized lung apices.  IMPRESSION: 1. There is new isodense subdural left hemispheric collection which measures 9 mm maximum thickness. Only small amount of subacute blood products anteriorly see axial image 20. No mass effect or midline  shift. Stable atrophy and chronic white matter disease. No definite acute cortical infarction. No mass lesion is noted on this unenhanced scan 2. No cervical spine acute fracture or subluxation. Degenerative changes as described above. These results were called by telephone at the time of interpretation on 04/19/2014 at 10:15 am to Dr. Bethann BerkshireJOSEPH ZAMMIT , who verbally acknowledged these results.   Electronically Signed   By: Natasha MeadLiviu  Pop M.D.   On: 04/19/2014 10:15        Assessment & Plan  Active Problems:   Permanent atrial fibrillation   SICK SINUS SYNDROME   Fall   Gait instability   Chronic kidney disease, stage II (mild)   Dementia   Subdural hematoma    Full/subdural hematoma - ED physician discussed with Dr.Cabel from neurosurgery, at this point he did not recommend any further workup. - Will admit for observation, patient has history of multiple falls recently, will monitor on telemetry, check 2-D echo, will consult cardiology to interrogate his pacemaker. - This is most likely related to his gait instability , as had multiple falls recently ,We will consult PT. - Continue with neuro checks every 4 hours . - On fall precaution.   Chronic kidney disease - Creatinine appears  to be at baseline, continue to monitor closely.  Atrial fibrillation/sick sinus syndrome - Status post permanent pacemaker. - Heart rate controlled,  - ChadVasc >2, but not a candidate for anticoagulation given his recurrent falls, and history of GI bleed.  Dementia - Continue with Galatamin    DVT Prophylaxis  SCDs given subdural hematoma  AM Labs Ordered, also please review Full Orders  Family Communication: Admission, patients condition and plan of care including tests being ordered have been discussed with the caregiver and son  who indicate understanding and agree with the plan and Code Status.  Code Status DNR .  Likely DC to  Pending PT evaluation.  Condition GUARDED    Time spent in  minutes : 55 minutes    Kayloni Rocco M.D on 04/19/2014 at 12:04 PM  Between 7am to 7pm - Pager - [phone]  After 7pm go to [email or web address] - password [email or web address]  And look for the night coverage person covering me after hours  [facility] Office  [phone]   **Disclaimer: This note may have been dictated with voice recognition software. Similar sounding words can inadvertently be transcribed and this note may contain transcription errors which may not have been corrected upon publication of note.**

## 2014-04-19 NOTE — Progress Notes (Signed)
Utilization review completed.  

## 2014-04-19 NOTE — Progress Notes (Signed)
[facility] arrived from ED, Alert to self, disoriented to place and time, family at bedside. MD notified of pt's location. Will continue with current plan of care.

## 2014-04-19 NOTE — ED Notes (Signed)
Zammit at bedside assessing pt; Zammit removes c collar; pt denies complaint.

## 2014-04-19 NOTE — ED Notes (Signed)
[facility]; pt found sitting at foot of the bed. Pt denies pain. Pt hx of dementia.Upon assessment pt has generalized bruising to face and neck; nurse aide at bedside states bruising from fall a few weeks ago; nurse aide states does not recognize any NEW bruising or injury. Pt NOT on blood thinners.

## 2014-04-19 NOTE — Progress Notes (Signed)
PHARMACIST - PHYSICIAN ORDER COMMUNICATION  CONCERNING: P&T Medication Policy on Herbal Medications  DESCRIPTION:  This patient's order for:  Glucosamine/chondroitin has been noted.  This product(s) is classified as an "herbal" or natural product. Due to a lack of definitive safety studies or FDA approval, nonstandard manufacturing practices, plus the potential risk of unknown drug-drug interactions while on inpatient medications, the Pharmacy and Therapeutics Committee does not permit the use of "herbal" or natural products of this type within [facility].   ACTION TAKEN: The pharmacy department is unable to verify this order at this time and your patient has been informed of this safety policy. Please reevaluate patient's clinical condition at discharge and address if the herbal or natural product(s) should be resumed at that time.  Juliette Alcideustin Crystal Ellwood, PharmD, BCPS.   Pager: [phone] 04/19/2014 2:42 PM

## 2014-04-19 NOTE — ED Notes (Signed)
Pt attempting to provide urine sample at present time.

## 2014-04-19 NOTE — ED Provider Notes (Signed)
CSN: [id number]     Arrival date & time 04/19/14  0845 History   First MD Initiated Contact with Patient 04/19/14 0848     Chief Complaint  Patient presents with  . Fall     (Consider location/radiation/quality/duration/timing/severity/associated sxs/prior Treatment) Patient is a 81 y.o. male presenting with fall. The history is provided by a caregiver (pt was found on the floor today by his care giver.  the pt is left at home alone for 14 hours a day.  he had a fall  within the last month also).  Fall This is a new problem. The current episode started 12 to 24 hours ago. The problem occurs rarely. The problem has not changed since onset.Nothing aggravates the symptoms. Nothing relieves the symptoms. He has tried nothing for the symptoms.    Past Medical History  Diagnosis Date  . Cardiac pacemaker in situ     a.fib w/slow ventricular response  . Atrial fibrillation   . Myalgia and myositis, unspecified   . Unspecified essential hypertension   . Other and unspecified hyperlipidemia   . Sinoatrial node dysfunction   . Acute posthemorrhagic anemia   . Depression   . Gastric ulcer   . Dementia   . Vitamin D deficiency   . Gait disorder   . Chronic kidney disease, stage II (mild)   . Acute gastric ulcer with hemorrhage, without mention of obstruction   . Other malaise and fatigue   . Edema   . Personal history of fall   . Hemorrhage of gastrointestinal tract, unspecified 10/13/11  . Muscle weakness (generalized)   . Anemia, unspecified   . Other specified disease of sebaceous glands     xerosis cutis  . Unspecified pruritic disorder   . Unspecified urinary incontinence 01/01/10  . Unspecified hearing loss   . Peripheral vascular disease, unspecified   . Syncope and collapse 08/08/2008  . Abrasion of face   . Cough   . Type II or unspecified type diabetes mellitus with renal manifestations, not stated as uncontrolled    Past Surgical History  Procedure Laterality Date  . No  past surgeries    . Esophagogastroduodenoscopy  10/08/2011    Procedure: ESOPHAGOGASTRODUODENOSCOPY (EGD);  Surgeon: Willis Modena, MD;  Location: [facility];  Service: Endoscopy;  Laterality: N/A;  . Pacemaker generator change  08/05/07    [facility]  . Pacemaker insertion  2003  . Appendectomy  1926  . Cholecystectomy  1956  . Tonsillectomy  1926   Family History  Problem Relation Age of Onset  . Heart attack Mother     had a couple  . Heart failure Mother   . Emphysema Father   . Cancer Sister     lung   History  Substance Use Topics  . Smoking status: Former Smoker -- 5 years    Types: Pipe  . Smokeless tobacco: Never Used  . Alcohol Use: No    Review of Systems  Unable to perform ROS: Dementia      Allergies  Aricept and Simvastatin  Home Medications   Prior to Admission medications   Medication Sig Start Date End Date Taking? Authorizing Provider  aspirin 81 MG tablet Take 81 mg by mouth daily.   Yes Historical Provider, MD  Cholecalciferol (VITAMIN D) 2000 UNITS tablet Take 1,000 Units by mouth daily.    Yes Historical Provider, MD  dextromethorphan (DELSYM) 30 MG/5ML liquid Take 10 mLs (60 mg total) by mouth as needed for cough. 04/19/13  Yes  Kimber Relic, MD  ENSURE (ENSURE) Take 237 mLs by mouth. One can daily   Yes Historical Provider, MD  galantamine (RAZADYNE) 8 MG tablet TAKE 1 TABLET TWICE DAILY. Patient taking differently: take two tablets every morning 02/07/14  Yes Kimber Relic, MD  Glucosamine-Chondroitin (GLUCOSAMINE CHONDR COMPLEX PO) Take 1 capsule by mouth daily.   Yes Historical Provider, MD  pantoprazole (PROTONIX) 40 MG tablet TAKE 1 TABLET DAILY TO REDUCE STOMACH ACID. 10/31/13  Yes Kimber Relic, MD  pyrithione zinc (SELSUN BLUE DRY SCALP) 1 % shampoo Shampoo at least 3 times weekly 11/22/13  Yes Kimber Relic, MD  SALINE NASAL SPRAY NA Place into the nose. One spray in each nostril as needed   Yes Historical Provider, MD  sertraline  (ZOLOFT) 50 MG tablet TAKE ONE TABLET DAILY TO HELP WITH NERVES. 02/07/14  Yes Kimber Relic, MD   BP 156/69 mmHg  Pulse 67  Temp(Src) 97.3 F (36.3 C) (Oral)  Resp 20  SpO2 100% Physical Exam  Constitutional: He appears well-developed.  HENT:  Head: Normocephalic.  Eyes: Conjunctivae and EOM are normal. No scleral icterus.  Neck: Neck supple. No thyromegaly present.  Cardiovascular: Normal rate and regular rhythm.  Exam reveals no gallop and no friction rub.   No murmur heard. Pulmonary/Chest: No stridor. He has no wheezes. He has no rales. He exhibits no tenderness.  Abdominal: He exhibits no distension. There is no tenderness. There is no rebound.  Musculoskeletal: Normal range of motion. He exhibits no edema.  Lymphadenopathy:    He has no cervical adenopathy.  Neurological: He is alert. He exhibits normal muscle tone. Coordination normal.  Alert,  Only oriented to person  Skin: No rash noted. No erythema.  Psychiatric: He has a normal mood and affect. His behavior is normal.    ED Course  Procedures (including critical care time) Labs Review Labs Reviewed  CBC WITH DIFFERENTIAL/PLATELET - Abnormal; Notable for the following:    RBC 3.76 (*)    Hemoglobin 11.6 (*)    HCT 35.6 (*)    Platelets 140 (*)    Neutrophils Relative % 79 (*)    Lymphocytes Relative 11 (*)    All other components within normal limits  COMPREHENSIVE METABOLIC PANEL - Abnormal; Notable for the following:    Glucose, Bld 133 (*)    BUN 29 (*)    Creatinine, Ser 2.38 (*)    Total Bilirubin 1.4 (*)    GFR calc non Af Amer 22 (*)    GFR calc Af Amer 25 (*)    All other components within normal limits  URINALYSIS, ROUTINE W REFLEX MICROSCOPIC - Abnormal; Notable for the following:    Protein, ur 30 (*)    All other components within normal limits  CK  TROPONIN I  URINE MICROSCOPIC-ADD ON    Imaging Review Dg Lumbar Spine Complete  04/19/2014   CLINICAL DATA:  81 year old male with  unwitnessed fall. Bruising. Initial encounter.  EXAM: LUMBAR SPINE - COMPLETE 4+ VIEW  COMPARISON:  None.  FINDINGS: Normal lumbar segmentation. Partially visible cardiac pacemaker lead. Suture material in the mid abdomen. Severe or complete disc space loss at L4-L5, there may be interbody ankylosis. There is moderate to severe facet hypertrophy from L3 to the sacrum. Bulky endplate osteophytes elsewhere in the lumbar spine with relatively preserved disc spaces. No compression fracture. Both SI joints may be ankylosed. Aortoiliac calcified atherosclerosis noted. Grossly intact visible lower thoracic levels.  IMPRESSION: 1.  No acute fracture or listhesis identified in the lumbar spine. 2. 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   Electronically Signed   By: Odessa Fleming M.D.   On: 04/19/2014 10:24   Dg Pelvis 1-2 Views  04/19/2014   CLINICAL DATA:  Unwitnessed fall, history dementia, bruising to face and neck  EXAM: PELVIS - 1-2 VIEW  COMPARISON:  None  FINDINGS: Osseous demineralization.  Mild narrowing of the hip joints bilaterally.  SI joints poorly visualized due to degree of demineralization.  No acute fracture, dislocation or bone destruction.  Degenerative disc disease changes at visualized lower lumbar spine.  Scattered atherosclerotic calcifications.  IMPRESSION: Osseous demineralization.  No acute abnormalities.   Electronically Signed   By: Ulyses Southward M.D.   On: 04/19/2014 10:25   Ct Head Wo Contrast  04/19/2014   CLINICAL DATA:  Fall, dementia  EXAM: CT HEAD WITHOUT CONTRAST  CT CERVICAL SPINE WITHOUT CONTRAST  TECHNIQUE: Multidetector CT imaging of the head and cervical spine was performed following the standard protocol without intravenous contrast. Multiplanar CT image reconstructions of the cervical spine were also generated.  COMPARISON:  09/01/2012  FINDINGS: CT HEAD FINDINGS  No skull fracture is noted. Again noted central heterogeneous opacification left maxillary sinus.  Thickening of the lateral wall of left maxillary sinus is stable. Stable cystic changes in left sphenoid bone.  There is new subdural collection along left hemisphere which is isodense measures about 9 mm thickness. Only small amount of anterior increased attenuation axial image 20 probable subacute blood products. No significant mass effect or midline shift. Stable cerebral atrophy. Stable periventricular and patchy subcortical chronic white matter disease. No definite acute cortical infarction. No mass lesion is noted.  CT CERVICAL SPINE FINDINGS  Axial images of the cervical spine shows no acute fracture or or subluxation. Atherosclerotic calcification bilateral carotid bifurcation. Computer processed images shows no acute fracture or subluxation. There is significant disc space flattening at C4-C5 level. Mild disc space flattening at C2-C3 and C5-C6 level. Mild disc space flattening at C6-C7 and C7-T1 level. Large anterior bridging osteophytes at C2-C3 C3-C4 and C4-C5 level. Degenerative changes C1-C2 articulation. Multilevel facet degenerative changes. There is no pneumothorax in visualized lung apices.  IMPRESSION: 1. There is new isodense subdural left hemispheric collection which measures 9 mm maximum thickness. Only small amount of subacute blood products anteriorly see axial image 20. No mass effect or midline shift. Stable atrophy and chronic white matter disease. No definite acute cortical infarction. No mass lesion is noted on this unenhanced scan 2. No cervical spine acute fracture or subluxation. Degenerative changes as described above. These results were called by telephone at the time of interpretation on 04/19/2014 at 10:15 am to Dr. Bethann Berkshire , who verbally acknowledged these results.   Electronically Signed   By: Natasha Mead M.D.   On: 04/19/2014 10:15   Ct Cervical Spine Wo Contrast  04/19/2014   CLINICAL DATA:  Fall, dementia  EXAM: CT HEAD WITHOUT CONTRAST  CT CERVICAL SPINE WITHOUT  CONTRAST  TECHNIQUE: Multidetector CT imaging of the head and cervical spine was performed following the standard protocol without intravenous contrast. Multiplanar CT image reconstructions of the cervical spine were also generated.  COMPARISON:  09/01/2012  FINDINGS: CT HEAD FINDINGS  No skull fracture is noted. Again noted central heterogeneous opacification left maxillary sinus. Thickening of the lateral wall of left maxillary sinus is stable. Stable cystic changes in left sphenoid bone.  There is new subdural  collection along left hemisphere which is isodense measures about 9 mm thickness. Only small amount of anterior increased attenuation axial image 20 probable subacute blood products. No significant mass effect or midline shift. Stable cerebral atrophy. Stable periventricular and patchy subcortical chronic white matter disease. No definite acute cortical infarction. No mass lesion is noted.  CT CERVICAL SPINE FINDINGS  Axial images of the cervical spine shows no acute fracture or or subluxation. Atherosclerotic calcification bilateral carotid bifurcation. Computer processed images shows no acute fracture or subluxation. There is significant disc space flattening at C4-C5 level. Mild disc space flattening at C2-C3 and C5-C6 level. Mild disc space flattening at C6-C7 and C7-T1 level. Large anterior bridging osteophytes at C2-C3 C3-C4 and C4-C5 level. Degenerative changes C1-C2 articulation. Multilevel facet degenerative changes. There is no pneumothorax in visualized lung apices.  IMPRESSION: 1. There is new isodense subdural left hemispheric collection which measures 9 mm maximum thickness. Only small amount of subacute blood products anteriorly see axial image 20. No mass effect or midline shift. Stable atrophy and chronic white matter disease. No definite acute cortical infarction. No mass lesion is noted on this unenhanced scan 2. No cervical spine acute fracture or subluxation. Degenerative changes as  described above. These results were called by telephone at the time of interpretation on 04/19/2014 at 10:15 am to Dr. Bethann BerkshireJOSEPH Roc Streett , who verbally acknowledged these results.   Electronically Signed   By: Natasha MeadLiviu  Pop M.D.   On: 04/19/2014 10:15     EKG Interpretation None      MDM   Final diagnoses:  Pain  Fall, initial encounter  Subdural hematoma    I spoke with dr. [facility] and he looked at the ct head scan.  He stated that the pt did not need another ct head.  He would not be a surgical candidate     Bethann BerkshireJoseph Gyneth Hubka, MD 04/19/14 1153

## 2014-04-19 NOTE — Progress Notes (Signed)
  Echocardiogram 2D Echocardiogram has been performed.  Patrick Murray, Patrick Murray 04/19/2014, 1:54 PM

## 2014-04-19 NOTE — ED Notes (Signed)
Pt transported to DG/CT.

## 2014-04-20 DIAGNOSIS — I482 Chronic atrial fibrillation: Secondary | ICD-10-CM

## 2014-04-20 DIAGNOSIS — F0390 Unspecified dementia without behavioral disturbance: Secondary | ICD-10-CM

## 2014-04-20 DIAGNOSIS — I6200 Nontraumatic subdural hemorrhage, unspecified: Secondary | ICD-10-CM

## 2014-04-20 DIAGNOSIS — N184 Chronic kidney disease, stage 4 (severe): Secondary | ICD-10-CM

## 2014-04-20 DIAGNOSIS — W19XXXD Unspecified fall, subsequent encounter: Secondary | ICD-10-CM

## 2014-04-20 HISTORY — DX: Chronic kidney disease, stage 4 (severe): N18.4

## 2014-04-20 LAB — BASIC METABOLIC PANEL
ANION GAP: 9 (ref 5–15)
BUN: 28 mg/dL — ABNORMAL HIGH (ref 6–23)
CHLORIDE: 107 mmol/L (ref 96–112)
CO2: 20 mmol/L (ref 19–32)
Calcium: 8.8 mg/dL (ref 8.4–10.5)
Creatinine, Ser: 2.28 mg/dL — ABNORMAL HIGH (ref 0.50–1.35)
GFR calc Af Amer: 27 mL/min — ABNORMAL LOW (ref >90)
GFR calc non Af Amer: 23 mL/min — ABNORMAL LOW (ref >90)
Glucose, Bld: 130 mg/dL — ABNORMAL HIGH (ref 70–99)
POTASSIUM: 4.3 mmol/L (ref 3.5–5.1)
Sodium: 136 mmol/L (ref 135–145)

## 2014-04-20 LAB — CBC
HEMATOCRIT: 33.1 % — AB (ref 39.0–52.0)
HEMOGLOBIN: 10.9 g/dL — AB (ref 13.0–17.0)
MCH: 31 pg (ref 26.0–34.0)
MCHC: 32.9 g/dL (ref 30.0–36.0)
MCV: 94 fL (ref 78.0–100.0)
Platelets: 131 10*3/uL — ABNORMAL LOW (ref 150–400)
RBC: 3.52 MIL/uL — AB (ref 4.22–5.81)
RDW: 13.6 % (ref 11.5–15.5)
WBC: 6.7 10*3/uL (ref 4.0–10.5)

## 2014-04-20 MED ORDER — DEXTROMETHORPHAN POLISTIREX ER 30 MG/5ML PO SUER
60.0000 mg | Freq: Two times a day (BID) | ORAL | Status: DC | PRN
  Filled 2014-04-20: qty 10

## 2014-04-20 NOTE — Progress Notes (Addendum)
Primary Physician: Primary Cardiologist:  Patrick Murray   HPI: Patient is a 81 yo who is followed by Judie Petit Patrick Murray for afib  (permanant), CHADSVASc 4 and PPM (programmed VVIR)  Also has a history of dementia, GI bleed and falls.  Yester fell  Fowund in AM by caregiver.  CT with small subdural hematoma     Echo done  Asked to see re HR  Orhtostatics pending.    Caregiver present  She is there during day  No one at night  SOn lives in [place]  Has been reluctant to increase care  [facility] says since a fall about 1 month ago, he is different  Moving around is more difficult  She says he does not appear to bge in discomfort.  Occasionally appears dizzys/unsteady.   No PND>  DOes give out with physical activity but this is not new. Eating is down.    Patinet does not answer questions.        Past Medical History  Diagnosis Date  . Cardiac pacemaker in situ     a.fib w/slow ventricular response  . Atrial fibrillation   . Myalgia and myositis, unspecified   . Unspecified essential hypertension   . Other and unspecified hyperlipidemia   . Sinoatrial node dysfunction   . Acute posthemorrhagic anemia   . Depression   . Gastric ulcer   . Dementia   . Vitamin D deficiency   . Gait disorder   . Chronic kidney disease, stage II (mild)   . Acute gastric ulcer with hemorrhage, without mention of obstruction   . Other malaise and fatigue   . Edema   . Personal history of fall   . Hemorrhage of gastrointestinal tract, unspecified 10/13/11  . Muscle weakness (generalized)   . Anemia, unspecified   . Other specified disease of sebaceous glands     xerosis cutis  . Unspecified pruritic disorder   . Unspecified urinary incontinence 01/01/10  . Unspecified hearing loss   . Peripheral vascular disease, unspecified   . Syncope and collapse 08/08/2008  . Abrasion of face   . Cough   . Type II or unspecified type diabetes mellitus with renal manifestations, not stated as uncontrolled      Medications Prior to Admission  Medication Sig Dispense Refill  . aspirin 81 MG tablet Take 81 mg by mouth daily.    . Cholecalciferol (VITAMIN D) 2000 UNITS tablet Take 1,000 Units by mouth daily.     Marland Kitchen dextromethorphan (DELSYM) 30 MG/5ML liquid Take 10 mLs (60 mg total) by mouth as needed for cough. 90 mL 3  . ENSURE (ENSURE) Take 237 mLs by mouth. One can daily    . galantamine (RAZADYNE) 8 MG tablet TAKE 1 TABLET TWICE DAILY. (Patient taking differently: take two tablets every morning) 180 tablet 1  . Glucosamine-Chondroitin (GLUCOSAMINE CHONDR COMPLEX PO) Take 1 capsule by mouth daily.    . pantoprazole (PROTONIX) 40 MG tablet TAKE 1 TABLET DAILY TO REDUCE STOMACH ACID. 90 tablet 1  . pyrithione zinc (SELSUN BLUE DRY SCALP) 1 % shampoo Shampoo at least 3 times weekly 400 mL 12  . SALINE NASAL SPRAY NA Place into the nose. One spray in each nostril as needed    . sertraline (ZOLOFT) 50 MG tablet TAKE ONE TABLET DAILY TO HELP WITH NERVES. 90 tablet 1     . cholecalciferol  1,000 Units Oral Daily  . galantamine  8 mg Oral BID  . pantoprazole  40 mg Oral Daily  .  sertraline  50 mg Oral Daily    Infusions: . sodium chloride 50 mL/hr at 04/20/14 0941    Allergies  Allergen Reactions  . Aricept [Donepezil Hcl] Other (See Comments)    Unknown   . Simvastatin Other (See Comments)    Unknown     History   Social History  . Marital Status: Married    Spouse Name: N/A  . Number of Children: N/A  . Years of Education: N/A   Occupational History  . retired     Social History Main Topics  . Smoking status: Former Smoker -- 5 years    Types: Pipe  . Smokeless tobacco: Never Used  . Alcohol Use: No  . Drug Use: No  . Sexual Activity: No   Other Topics Concern  . Not on file   Social History Narrative   Retired. Walks 5 miles for exercise. Does not use ethanol.    [facility]    [place]   Has Pacemaker   Living Will    Family History   Problem Relation Age of Onset  . Heart attack Mother     had a couple  . Heart failure Mother   . Emphysema Father   . Cancer Sister     lung    REVIEW OF SYSTEMS:  All systems reviewed  Negative to the above problem except as noted above.    PHYSICAL EXAM: Filed Vitals:   04/20/14 0521  BP: 162/87  Pulse: 91  Temp: 97.6 F (36.4 C)  Resp: 20     Intake/Output Summary (Last 24 hours) at 04/20/14 0947 Last data filed at 04/19/14 1756  Gross per 24 hour  Intake    120 ml  Output      0 ml  Net    120 ml    General: patinet is in NAD   HEENT: normal Neck: supple. no JVD. Bruise on R neck  Carotids 2+ bilat; no bruits. No lymphadenopathy or thryomegaly appreciated. Cor: PMI nondisplaced. Regular rate & rhythm. No rubs, gallops   Gr I/VI systolic murmur L SB   Lungs: clear Abdomen: soft, nontender, nondistended. No hepatosplenomegaly. No bruits or masses. Good bowel sounds. Chest  Bruise in upper chest   Extremities: no cyanosis, clubbing, rash, edema Neuro: Patient is awake  Does not answer all questions.    ECG:  No EKG done  Tele:  INtermitt pacing  Afib  Rates 70s    Results for orders placed or performed during the hospital encounter of 04/19/14 (from the past 24 hour(s))  Basic metabolic panel     Status: Abnormal   Collection Time: 04/20/14  5:15 AM  Result Value Ref Range   Sodium 136 135 - 145 mmol/L   Potassium 4.3 3.5 - 5.1 mmol/L   Chloride 107 96 - 112 mmol/L   CO2 20 19 - 32 mmol/L   Glucose, Bld 130 (H) 70 - 99 mg/dL   BUN 28 (H) 6 - 23 mg/dL   Creatinine, Ser 4.092.28 (H) 0.50 - 1.35 mg/dL   Calcium 8.8 8.4 - 81.110.5 mg/dL   GFR calc non Af Amer 23 (L) >90 mL/min   GFR calc Af Amer 27 (L) >90 mL/min   Anion gap 9 5 - 15  CBC     Status: Abnormal   Collection Time: 04/20/14  5:15 AM  Result Value Ref Range   WBC 6.7 4.0 - 10.5 K/uL   RBC 3.52 (L) 4.22 -  5.81 MIL/uL   Hemoglobin 10.9 (L) 13.0 - 17.0 g/dL   HCT 16.1 (L) 09.6 - 04.5 %   MCV 94.0  78.0 - 100.0 fL   MCH 31.0 26.0 - 34.0 pg   MCHC 32.9 30.0 - 36.0 g/dL   RDW 40.9 81.1 - 91.4 %   Platelets 131 (L) 150 - 400 K/uL   Dg Lumbar Spine Complete  04/19/2014   CLINICAL DATA:  81 year old male with unwitnessed fall. Bruising. Initial encounter.  EXAM: LUMBAR SPINE - COMPLETE 4+ VIEW  COMPARISON:  None.  FINDINGS: Normal lumbar segmentation. Partially visible cardiac pacemaker lead. Suture material in the mid abdomen. Severe or complete disc space loss at L4-L5, there may be interbody ankylosis. There is moderate to severe facet hypertrophy from L3 to the sacrum. Bulky endplate osteophytes elsewhere in the lumbar spine with relatively preserved disc spaces. No compression fracture. Both SI joints may be ankylosed. Aortoiliac calcified atherosclerosis noted. Grossly intact visible lower thoracic levels.  IMPRESSION: 1.  No acute fracture or listhesis identified in the lumbar spine. 2. 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   Electronically Signed   By: Odessa Fleming M.D.   On: 04/19/2014 10:24   Dg Pelvis 1-2 Views  04/19/2014   CLINICAL DATA:  Unwitnessed fall, history dementia, bruising to face and neck  EXAM: PELVIS - 1-2 VIEW  COMPARISON:  None  FINDINGS: Osseous demineralization.  Mild narrowing of the hip joints bilaterally.  SI joints poorly visualized due to degree of demineralization.  No acute fracture, dislocation or bone destruction.  Degenerative disc disease changes at visualized lower lumbar spine.  Scattered atherosclerotic calcifications.  IMPRESSION: Osseous demineralization.  No acute abnormalities.   Electronically Signed   By: Ulyses Southward M.D.   On: 04/19/2014 10:25   Ct Head Wo Contrast  04/19/2014   CLINICAL DATA:  Fall, dementia  EXAM: CT HEAD WITHOUT CONTRAST  CT CERVICAL SPINE WITHOUT CONTRAST  TECHNIQUE: Multidetector CT imaging of the head and cervical spine was performed following the standard protocol without intravenous contrast. Multiplanar CT  image reconstructions of the cervical spine were also generated.  COMPARISON:  09/01/2012  FINDINGS: CT HEAD FINDINGS  No skull fracture is noted. Again noted central heterogeneous opacification left maxillary sinus. Thickening of the lateral wall of left maxillary sinus is stable. Stable cystic changes in left sphenoid bone.  There is new subdural collection along left hemisphere which is isodense measures about 9 mm thickness. Only small amount of anterior increased attenuation axial image 20 probable subacute blood products. No significant mass effect or midline shift. Stable cerebral atrophy. Stable periventricular and patchy subcortical chronic white matter disease. No definite acute cortical infarction. No mass lesion is noted.  CT CERVICAL SPINE FINDINGS  Axial images of the cervical spine shows no acute fracture or or subluxation. Atherosclerotic calcification bilateral carotid bifurcation. Computer processed images shows no acute fracture or subluxation. There is significant disc space flattening at C4-C5 level. Mild disc space flattening at C2-C3 and C5-C6 level. Mild disc space flattening at C6-C7 and C7-T1 level. Large anterior bridging osteophytes at C2-C3 C3-C4 and C4-C5 level. Degenerative changes C1-C2 articulation. Multilevel facet degenerative changes. There is no pneumothorax in visualized lung apices.  IMPRESSION: 1. There is new isodense subdural left hemispheric collection which measures 9 mm maximum thickness. Only small amount of subacute blood products anteriorly see axial image 20. No mass effect or midline shift. Stable atrophy and chronic white matter disease. No definite acute  cortical infarction. No mass lesion is noted on this unenhanced scan 2. No cervical spine acute fracture or subluxation. Degenerative changes as described above. These results were called by telephone at the time of interpretation on 04/19/2014 at 10:15 am to Dr. Bethann Berkshire , who verbally acknowledged these  results.   Electronically Signed   By: Natasha Mead M.D.   On: 04/19/2014 10:15   Ct Cervical Spine Wo Contrast  04/19/2014   CLINICAL DATA:  Fall, dementia  EXAM: CT HEAD WITHOUT CONTRAST  CT CERVICAL SPINE WITHOUT CONTRAST  TECHNIQUE: Multidetector CT imaging of the head and cervical spine was performed following the standard protocol without intravenous contrast. Multiplanar CT image reconstructions of the cervical spine were also generated.  COMPARISON:  09/01/2012  FINDINGS: CT HEAD FINDINGS  No skull fracture is noted. Again noted central heterogeneous opacification left maxillary sinus. Thickening of the lateral wall of left maxillary sinus is stable. Stable cystic changes in left sphenoid bone.  There is new subdural collection along left hemisphere which is isodense measures about 9 mm thickness. Only small amount of anterior increased attenuation axial image 20 probable subacute blood products. No significant mass effect or midline shift. Stable cerebral atrophy. Stable periventricular and patchy subcortical chronic white matter disease. No definite acute cortical infarction. No mass lesion is noted.  CT CERVICAL SPINE FINDINGS  Axial images of the cervical spine shows no acute fracture or or subluxation. Atherosclerotic calcification bilateral carotid bifurcation. Computer processed images shows no acute fracture or subluxation. There is significant disc space flattening at C4-C5 level. Mild disc space flattening at C2-C3 and C5-C6 level. Mild disc space flattening at C6-C7 and C7-T1 level. Large anterior bridging osteophytes at C2-C3 C3-C4 and C4-C5 level. Degenerative changes C1-C2 articulation. Multilevel facet degenerative changes. There is no pneumothorax in visualized lung apices.  IMPRESSION: 1. There is new isodense subdural left hemispheric collection which measures 9 mm maximum thickness. Only small amount of subacute blood products anteriorly see axial image 20. No mass effect or midline  shift. Stable atrophy and chronic white matter disease. No definite acute cortical infarction. No mass lesion is noted on this unenhanced scan 2. No cervical spine acute fracture or subluxation. Degenerative changes as described above. These results were called by telephone at the time of interpretation on 04/19/2014 at 10:15 am to Dr. Bethann Berkshire , who verbally acknowledged these results.   Electronically Signed   By: Natasha Mead M.D.   On: 04/19/2014 10:15     ASSESSMENT:  Patient is a 81 yo with history of DM, atrial fibrillation,  PPM  And falls  Found down yesterday  Not observed  No clear syncope  Patient cannot give history due to dementa  On exam, BP is mildly increased.  Orthostatics pending. Otherwise appears comfortable  Volume status is OK Tele shows Afib  Rates good   Pacer interrogation shows rates OK Labs are fairly stable  Cr has been increased for at least several months   Echo yesterday is difficult  Windows not good.  LVEF on my review appears mildly down  I think there are some regional wall motion chagnes  Would need definity to confirm  But , I do not think would change Rx.   I do not seen any acute cardiac problems to explain fall.  Even if orhtostatic, beyond encouraging po intake there is not much to do as BP is already up. Watch I/O for now.   HR has been good  Not an  anticoagulation candidate with fallls   I would not pursue further cardiac work up In light of echo findings given his age and singif dementia  GOal would be to keep comfortable.    Patient really needs 24 hour supervision .  WIll be available as needed     2.  Afib    Has PPM  Not anticoag candidate  3.  HTN  BP is increased  But, lowering may make falls more likely  WIll need to be followed. As outpt.  4.  Dementia  Significant

## 2014-04-20 NOTE — Evaluation (Signed)
Physical Therapy Evaluation Patient Details Name: Patrick Murray MRN: [id number] DOB: 12/06/1917 Today's Date: 04/20/2014   History of Present Illness  81 yo male admitted with subdural hematoma. Hx of pacemaker, dementia, A fib, gait d/o, DM. [facility]  Clinical Impression  On eval, pt required Mod assist for mobility-able to ambulate ~30 feet with RW. Pt is at high risk for falls. Decreased safety awareness evident during session. Recommend SNF.    Follow Up Recommendations SNF;Supervision/Assistance - 24 hour    Equipment Recommendations  None recommended by PT    Recommendations for Other Services       Precautions / Restrictions Precautions Precautions: Fall Restrictions Weight Bearing Restrictions: No      Mobility  Bed Mobility Overal bed mobility: Needs Assistance Bed Mobility: Supine to Sit     Supine to sit: Mod assist     General bed mobility comments: Assist for trunk and LEs to initiate task. Increased time. Multimodal cues  Transfers Overall transfer level: Needs assistance Equipment used: Rolling walker (2 wheeled) Transfers: Sit to/from Stand Sit to Stand: Mod assist         General transfer comment: Assist to rise, stabilize, control descent. Pt attempts to sit before safely positioned.   Ambulation/Gait Ambulation/Gait assistance: Mod assist Ambulation Distance (Feet): 30 Feet Assistive device: Rolling walker (2 wheeled) Gait Pattern/deviations: Step-through pattern;Decreased stride length;Decreased step length - left;Decreased step length - right;Trunk flexed     General Gait Details: Assist to stabilize pt and manever with walker. Pt tends to keep RW too far ahead. Fatigues easily.   Stairs            Wheelchair Mobility    Modified Rankin (Stroke Patients Only)       Balance Overall balance assessment: Needs assistance;History of Falls         Standing balance support: Bilateral upper  extremity supported;During functional activity Standing balance-Leahy Scale: Poor                               Pertinent Vitals/Pain Pain Assessment: No/denies pain    Home Living Family/patient expects to be discharged to:: Private residence   Available Help at Discharge: Personal care attendant (during day. Pt is alone at night) Type of Home: Independent living facility       Home Layout: One level Home Equipment: Walker - 2 wheels Additional Comments: Pt unable to provide PLOF info    Prior Function Level of Independence: Needs assistance   Gait / Transfers Assistance Needed: uses RW with assistance     Comments: caregiver present-states pt required assistance for ADLs/mobility. Pt is alone at night. Has experienced multiple falls.      Hand Dominance        Extremity/Trunk Assessment   Upper Extremity Assessment: Generalized weakness           Lower Extremity Assessment: Generalized weakness      Cervical / Trunk Assessment: Kyphotic  Communication      Cognition Arousal/Alertness: Awake/alert Behavior During Therapy: WFL for tasks assessed/performed Overall Cognitive Status: History of cognitive impairments - at baseline Area of Impairment: Orientation;Attention;Memory;Following commands;Safety/judgement;Problem solving Orientation Level: Disoriented to;Place;Time;Situation Current Attention Level: Focused Memory: Decreased short-term memory Following Commands: Follows one step commands with increased time Safety/Judgement: Decreased awareness of safety;Decreased awareness of deficits   Problem Solving: Requires tactile cues;Requires verbal cues;Slow processing;Decreased initiation;Difficulty sequencing  General Comments      Exercises        Assessment/Plan    PT Assessment Patient needs continued PT services  PT Diagnosis Difficulty walking;Generalized weakness;Altered mental status   PT Problem List Decreased  strength;Decreased activity tolerance;Decreased balance;Decreased mobility;Decreased cognition;Decreased knowledge of use of DME;Decreased safety awareness  PT Treatment Interventions DME instruction;Gait training;Functional mobility training;Therapeutic activities;Therapeutic exercise;Patient/family education;Balance training   PT Goals (Current goals can be found in the Care Plan section) Acute Rehab PT Goals Patient Stated Goal: pt unable to state PT Goal Formulation: Patient unable to participate in goal setting (with caregiver) Time For Goal Achievement: 05/04/14 Potential to Achieve Goals: Fair    Frequency Min 3X/week   Barriers to discharge        Co-evaluation               End of Session Equipment Utilized During Treatment: Gait belt Activity Tolerance: Patient limited by fatigue Patient left: in chair;with call bell/phone within reach;with chair alarm set           Time: 1350-1416 PT Time Calculation (min) (ACUTE ONLY): 26 min   Charges:   PT Evaluation $Initial PT Evaluation Tier I: 1 Procedure PT Treatments $Gait Training: 8-22 mins   PT G Codes:        Rebeca Alert, MPT Pager: [phone]

## 2014-04-20 NOTE — Progress Notes (Signed)
PROGRESS NOTE  Patrick Murray:[id number] DOB: [birth year] DOA: 04/19/2014 PCP: Kimber Relic, MD  Brief history 81 year old male with a history of permanent atrial fibrillation, SSS with PPM, dementia, diabetes mellitus, and CKD stage IV presented after he was found on the floor by his caregiver at his assisted living facility. The patient is unable to provide any history due to his dementia. The patient was awake and alert sitting up when he was found by his caregiver at 7 AM on 04/19/2014. CT of the brain in the emergency department revealed a small subdural hematoma. Neurosurgery was consulted and did not feel that the patient needed any interventions at this time. There was no mass effect or effacement. Since admission, the patient has been more alert but remains a little more confused than baseline according to his caregiver. Assessment/Plan: Subdural hematoma -No intervention at this time per neurosurgery -Supportive care -Continue neuro checks -Physical therapy evaluation Mechanical fall versus syncope -pt found on floor by caregiver -04/19/2014 echocardiogram shows Inf Wall HK which appears to be new from his previous echo -consult cardiology to also assist in clarifying if he is having long runs or tachycardia -place on tele -hold ASA for now -orthostatic vitals CKD stage IV -Based on creatinine 2.3-2.5 -Patient is at his baseline Dementia -continue [place] Permanent Atrial Fibrillation -question if pt having any prolonged RVR -04/19/14 echo with wall motion abnormalities -Rate controlled presently -Not a candidate for anticoagulation secondary to frequent falls -Hold aspirin secondary to subdural hematoma -EKG  -CHADS-VASc= 4 Diabetes mellitus type 2 -Allow for liberal glycemic control -03/27/2014 hemoglobin A1c 6.3  Family Communication:   Caregiver updated at beside Disposition Plan:   SNF when medically  stable       Procedures/Studies: Dg Lumbar Spine Complete  04/19/2014   CLINICAL DATA:  81 year old male with unwitnessed fall. Bruising. Initial encounter.  EXAM: LUMBAR SPINE - COMPLETE 4+ VIEW  COMPARISON:  None.  FINDINGS: Normal lumbar segmentation. Partially visible cardiac pacemaker lead. Suture material in the mid abdomen. Severe or complete disc space loss at L4-L5, there may be interbody ankylosis. There is moderate to severe facet hypertrophy from L3 to the sacrum. Bulky endplate osteophytes elsewhere in the lumbar spine with relatively preserved disc spaces. No compression fracture. Both SI joints may be ankylosed. Aortoiliac calcified atherosclerosis noted. Grossly intact visible lower thoracic levels.  IMPRESSION: 1.  No acute fracture or listhesis identified in the lumbar spine. 2. 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   Electronically Signed   By: Odessa Fleming M.D.   On: 04/19/2014 10:24   Dg Pelvis 1-2 Views  04/19/2014   CLINICAL DATA:  Unwitnessed fall, history dementia, bruising to face and neck  EXAM: PELVIS - 1-2 VIEW  COMPARISON:  None  FINDINGS: Osseous demineralization.  Mild narrowing of the hip joints bilaterally.  SI joints poorly visualized due to degree of demineralization.  No acute fracture, dislocation or bone destruction.  Degenerative disc disease changes at visualized lower lumbar spine.  Scattered atherosclerotic calcifications.  IMPRESSION: Osseous demineralization.  No acute abnormalities.   Electronically Signed   By: Ulyses Southward M.D.   On: 04/19/2014 10:25   Ct Head Wo Contrast  04/19/2014   CLINICAL DATA:  Fall, dementia  EXAM: CT HEAD WITHOUT CONTRAST  CT CERVICAL SPINE WITHOUT CONTRAST  TECHNIQUE: Multidetector CT imaging of the head and cervical spine was performed following the standard protocol without intravenous  contrast. Multiplanar CT image reconstructions of the cervical spine were also generated.  COMPARISON:  09/01/2012   FINDINGS: CT HEAD FINDINGS  No skull fracture is noted. Again noted central heterogeneous opacification left maxillary sinus. Thickening of the lateral wall of left maxillary sinus is stable. Stable cystic changes in left sphenoid bone.  There is new subdural collection along left hemisphere which is isodense measures about 9 mm thickness. Only small amount of anterior increased attenuation axial image 20 probable subacute blood products. No significant mass effect or midline shift. Stable cerebral atrophy. Stable periventricular and patchy subcortical chronic white matter disease. No definite acute cortical infarction. No mass lesion is noted.  CT CERVICAL SPINE FINDINGS  Axial images of the cervical spine shows no acute fracture or or subluxation. Atherosclerotic calcification bilateral carotid bifurcation. Computer processed images shows no acute fracture or subluxation. There is significant disc space flattening at C4-C5 level. Mild disc space flattening at C2-C3 and C5-C6 level. Mild disc space flattening at C6-C7 and C7-T1 level. Large anterior bridging osteophytes at C2-C3 C3-C4 and C4-C5 level. Degenerative changes C1-C2 articulation. Multilevel facet degenerative changes. There is no pneumothorax in visualized lung apices.  IMPRESSION: 1. There is new isodense subdural left hemispheric collection which measures 9 mm maximum thickness. Only small amount of subacute blood products anteriorly see axial image 20. No mass effect or midline shift. Stable atrophy and chronic white matter disease. No definite acute cortical infarction. No mass lesion is noted on this unenhanced scan 2. No cervical spine acute fracture or subluxation. Degenerative changes as described above. These results were called by telephone at the time of interpretation on 04/19/2014 at 10:15 am to Dr. Bethann BerkshireJOSEPH ZAMMIT , who verbally acknowledged these results.   Electronically Signed   By: Natasha MeadLiviu  Pop M.D.   On: 04/19/2014 10:15   Ct Cervical  Spine Wo Contrast  04/19/2014   CLINICAL DATA:  Fall, dementia  EXAM: CT HEAD WITHOUT CONTRAST  CT CERVICAL SPINE WITHOUT CONTRAST  TECHNIQUE: Multidetector CT imaging of the head and cervical spine was performed following the standard protocol without intravenous contrast. Multiplanar CT image reconstructions of the cervical spine were also generated.  COMPARISON:  09/01/2012  FINDINGS: CT HEAD FINDINGS  No skull fracture is noted. Again noted central heterogeneous opacification left maxillary sinus. Thickening of the lateral wall of left maxillary sinus is stable. Stable cystic changes in left sphenoid bone.  There is new subdural collection along left hemisphere which is isodense measures about 9 mm thickness. Only small amount of anterior increased attenuation axial image 20 probable subacute blood products. No significant mass effect or midline shift. Stable cerebral atrophy. Stable periventricular and patchy subcortical chronic white matter disease. No definite acute cortical infarction. No mass lesion is noted.  CT CERVICAL SPINE FINDINGS  Axial images of the cervical spine shows no acute fracture or or subluxation. Atherosclerotic calcification bilateral carotid bifurcation. Computer processed images shows no acute fracture or subluxation. There is significant disc space flattening at C4-C5 level. Mild disc space flattening at C2-C3 and C5-C6 level. Mild disc space flattening at C6-C7 and C7-T1 level. Large anterior bridging osteophytes at C2-C3 C3-C4 and C4-C5 level. Degenerative changes C1-C2 articulation. Multilevel facet degenerative changes. There is no pneumothorax in visualized lung apices.  IMPRESSION: 1. There is new isodense subdural left hemispheric collection which measures 9 mm maximum thickness. Only small amount of subacute blood products anteriorly see axial image 20. No mass effect or midline shift. Stable atrophy and chronic white matter disease.  No definite acute cortical infarction. No  mass lesion is noted on this unenhanced scan 2. No cervical spine acute fracture or subluxation. Degenerative changes as described above. These results were called by telephone at the time of interpretation on 04/19/2014 at 10:15 am to Dr. Bethann Berkshire , who verbally acknowledged these results.   Electronically Signed   By: Natasha Mead M.D.   On: 04/19/2014 10:15         Subjective: Patient is pleasantly confused. Denies any fevers, chills, chest pain, shortness breath, abdominal pain. No reports of uncontrolled pain,respiratory distress or diarrhea.  Objective: Filed Vitals:   04/19/14 1200 04/19/14 1259 04/19/14 1554 04/20/14 0521  BP: 164/75 169/99 162/68 162/87  Pulse: 65 73 74 91  Temp: 97.4 F (36.3 C)  98 F (36.7 C) 97.6 F (36.4 C)  TempSrc: Oral Oral Oral Oral  Resp: SpO2: 98% 96% 98% 95%    Intake/Output Summary (Last 24 hours) at 04/20/14 0843 Last data filed at 04/19/14 1756  Gross per 24 hour  Intake    120 ml  Output      0 ml  Net    120 ml   Weight change:  Exam:   General:  Pt is alert, follows commands appropriately, not in acute distress  HEENT: No icterus, No thrush,  [place]/AT  Cardiovascular: IRRR, S1/S2, no rubs, no gallops  Respiratory: Poor inspiratory effort but clear to auscultation  Abdomen: Soft/+BS, non tender, non distended, no guarding  Extremities: No edema, No lymphangitis, No petechiae, No rashes, no synovitis; scattered bruising on the patient's right sided neck  Data Reviewed: Basic Metabolic Panel:  Recent Labs Lab 04/19/14 0917 04/20/14 0515  NA 138 136  K 4.5 4.3  CL 106 107  CO2 23 20  GLUCOSE 133* 130*  BUN 29* 28*  CREATININE 2.38* 2.28*  CALCIUM 8.8 8.8   Liver Function Tests:  Recent Labs Lab 04/19/14 0917  AST 17  ALT 13  ALKPHOS 106  BILITOT 1.4*  PROT 6.9  ALBUMIN 3.8   No results for input(s): LIPASE, AMYLASE in the last 168 hours. No results for input(s): AMMONIA in the last 168  hours. CBC:  Recent Labs Lab 04/19/14 0917 04/20/14 0515  WBC 6.7 6.7  NEUTROABS 5.3  --   HGB 11.6* 10.9*  HCT 35.6* 33.1*  MCV 94.7 94.0  PLT 140* 131*   Cardiac Enzymes:  Recent Labs Lab 04/19/14 0917  CKTOTAL 61  TROPONINI 0.03   BNP: Invalid input(s): POCBNP CBG: No results for input(s): GLUCAP in the last 168 hours.  No results found for this or any previous visit (from the past 240 hour(s)).   Scheduled Meds: . cholecalciferol  1,000 Units Oral Daily  . galantamine  8 mg Oral BID  . pantoprazole  40 mg Oral Daily  . sertraline  50 mg Oral Daily   Continuous Infusions: . sodium chloride 50 mL/hr at 04/19/14 1441     Quanita Barona, DO  [facility] Pager [phone]  If 7PM-7AM, please contact night-coverage [email or web address] Password [facility] 04/20/2014, 8:43 AM   LOS: 1 day

## 2014-04-21 DIAGNOSIS — N184 Chronic kidney disease, stage 4 (severe): Secondary | ICD-10-CM

## 2014-04-21 DIAGNOSIS — I482 Chronic atrial fibrillation, unspecified: Secondary | ICD-10-CM | POA: Insufficient documentation

## 2014-04-21 LAB — BASIC METABOLIC PANEL
ANION GAP: 8 (ref 5–15)
BUN: 29 mg/dL — AB (ref 6–23)
CO2: 21 mmol/L (ref 19–32)
Calcium: 8.7 mg/dL (ref 8.4–10.5)
Chloride: 109 mmol/L (ref 96–112)
Creatinine, Ser: 2.32 mg/dL — ABNORMAL HIGH (ref 0.50–1.35)
GFR calc Af Amer: 26 mL/min — ABNORMAL LOW (ref >90)
GFR calc non Af Amer: 22 mL/min — ABNORMAL LOW (ref >90)
GLUCOSE: 118 mg/dL — AB (ref 70–99)
POTASSIUM: 4 mmol/L (ref 3.5–5.1)
Sodium: 138 mmol/L (ref 135–145)

## 2014-04-21 LAB — TSH: TSH: 1.217 u[IU]/mL (ref 0.350–4.500)

## 2014-04-21 NOTE — Progress Notes (Signed)
Clinical Social Work Department CLINICAL SOCIAL WORK PLACEMENT NOTE 04/21/2014  Patient:  Patrick Murray,[name]  Account Number:  [id number] Admit date:  04/19/2014  Clinical Social Worker:  [name], CLINICAL SOCIAL WORKER  Date/time:  04/21/2014 10:00 AM  Clinical Social Work is seeking post-discharge placement for this patient at the following level of care:   SKILLED NURSING   (*CSW will update this form in Epic as items are completed)   04/21/2014  [facility]/family provided with [facility] of Clinical [facility] Work's list of facilities offering this level of care within the geographic area requested by the patient (or if unable, by the patient's family).  04/21/2014  Patient/family informed of their freedom to choose among providers that offer the needed level of care, that participate in Medicare, Medicaid or managed care program needed by the patient, have an available bed and are willing to accept the patient.  04/21/2014  Patient/family informed of [facility]' ownership interest in [facility], as well as of the fact that they are under no obligation to receive care at this facility.  PASARR submitted to EDS on 04/21/2014 PASARR number received on 04/21/2014  FL2 transmitted to all facilities in geographic area requested by pt/family on  04/21/2014 FL2 transmitted to all facilities within larger geographic area on   Patient informed that his/her managed care company has contracts with or will negotiate with  certain facilities, including the following:     Patient/family informed of bed offers received:  04/21/2014 [facility] Physician recommends and patient chooses bed at    [facility]  04/22/2014 Patient to be transferred to facility by [facility] Patient and family notified of transfer on  Name of family member notified:    The following physician request were entered in  Epic:   Additional Comments:   [name], LCSWA 04/21/2014 12:08 PM [phone]

## 2014-04-21 NOTE — Progress Notes (Signed)
Clinical Social Work Department BRIEF PSYCHOSOCIAL ASSESSMENT 04/21/2014  Patient:  Patrick Murray,Patrick Murray     Account Number:  [id number]     Admit date:  04/19/2014  Clinical Social Worker:  Chad Cordialarter,Williard Keller, CLINICAL SOCIAL WORKER  Date/Time:  04/21/2014 10:00 AM  Referred by:  Physician  Date Referred:  04/20/2014 Referred for  SNF Placement   Other Referral:   Interview type:  Family Other interview type:    PSYCHOSOCIAL DATA Living Status:  ALONE Admitted from facility:  [facility] Level of care:  Independent Living Primary support name:  [name] Primary support relationship to patient:  CHILD, ADULT Degree of support available:   Adequate    CURRENT CONCERNS Current Concerns  Post-Acute Placement   Other Concerns:    SOCIAL WORK ASSESSMENT / PLAN CSW spoke with [facility] who reported that a bed was available in the skilled part of their facility. CSW spoke with Pt son who was in agreement and felt this plan was ideal. Pt admitted after falling during the night in his apartment at independent living. [facility]    CSW helping facilitating signing of admission paperwork as [facility] does not have admissions staff on the weekend. Contact number for admissions at [facility] given to son, [name]. CSW also [name] contact information.   Assessment/plan status:  Information/[facility] to [facility] Other assessment/ plan:   Information/referral to community resources:   List of SNF facilities    PATIENT'S/FAMILY'S RESPONSE TO PLAN OF CARE: Pt son, [name] reported feeling relieved that his father would be [facility] as well as staying another night in the hospital. [name] was appreciative of CSW help in facilitating this process and expressed that he would be able to be at the facility in the morning to help with the  transition.     [name], LCSWA 04/21/2014 12:17 PM [phone]

## 2014-04-21 NOTE — Progress Notes (Signed)
PROGRESS NOTE  STEPFON RAWLES WUJ:[id number] DOB: October 01, 1917 DOA: 04/19/2014 PCP: Kimber Relic, MD  Brief history 81 year old male with a history of permanent atrial fibrillation, SSS with PPM, dementia, diabetes mellitus, and CKD stage IV presented after he was found on the floor by his caregiver at his assisted living facility. The patient is unable to provide any history due to his dementia. The patient was awake and alert sitting up when he was found by his caregiver at 7 AM on 04/19/2014. CT of the brain in the emergency department revealed a small subdural hematoma. Neurosurgery was consulted and did not feel that the patient needed any interventions at this time. There was no mass effect or effacement. Since admission, the patient has been more alert but remains a little more confused than baseline according to his caregiver. Assessment/Plan: Subdural hematoma -No intervention at this time per neurosurgery -Supportive care -Continue neuro checks--mental status continues to improve (4/15) -Physical therapy evaluation--SNF -swallow eval as pt has intermittenly choked on food Mechanical fall versus syncope -pt found on floor by caregiver -04/19/2014 echocardiogram shows Inf Wall HK which appears to be new from his previous echo -consulted cardiology to also assist in clarifying if he is having long runs or tachycardia -placed on tele -cardiology evaluated PPM--> no concerning dysrhythmia -hold ASA for now Orthostatic hypotension -continue IVF -minimize antiHTN if possible -appreciate cardiology eval--no further cardiac eval at this time CKD stage IV -Based on creatinine 2.3-2.5 -Patient is at his baseline Dementia -continue Razadyne - pleasant confused at baseline Permanent Atrial Fibrillation -question if pt having any prolonged RVR-- cardiology evaluated PPM--> no concerning dysrhythmia -04/19/14 echo with wall motion abnormalities -Rate controlled  presently -Not a candidate for anticoagulation secondary to frequent falls -Hold aspirin secondary to subdural hematoma -CHADS-VASc= 4 Diabetes mellitus type 2 -Allow for liberal glycemic control -03/27/2014 hemoglobin A1c 6.3   Family Communication: Caregiver updated at beside Disposition Plan: SNF 4/16 if stable  Procedures/Studies: Dg Lumbar Spine Complete  04/19/2014   CLINICAL DATA:  81 year old male with unwitnessed fall. Bruising. Initial encounter.  EXAM: LUMBAR SPINE - COMPLETE 4+ VIEW  COMPARISON:  None.  FINDINGS: Normal lumbar segmentation. Partially visible cardiac pacemaker lead. Suture material in the mid abdomen. Severe or complete disc space loss at L4-L5, there may be interbody ankylosis. There is moderate to severe facet hypertrophy from L3 to the sacrum. Bulky endplate osteophytes elsewhere in the lumbar spine with relatively preserved disc spaces. No compression fracture. Both SI joints may be ankylosed. Aortoiliac calcified atherosclerosis noted. Grossly intact visible lower thoracic levels.  IMPRESSION: 1.  No acute fracture or listhesis identified in the lumbar spine. 2. 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   Electronically Signed   By: Odessa Fleming M.D.   On: 04/19/2014 10:24   Dg Pelvis 1-2 Views  04/19/2014   CLINICAL DATA:  Unwitnessed fall, history dementia, bruising to face and neck  EXAM: PELVIS - 1-2 VIEW  COMPARISON:  None  FINDINGS: Osseous demineralization.  Mild narrowing of the hip joints bilaterally.  SI joints poorly visualized due to degree of demineralization.  No acute fracture, dislocation or bone destruction.  Degenerative disc disease changes at visualized lower lumbar spine.  Scattered atherosclerotic calcifications.  IMPRESSION: Osseous demineralization.  No acute abnormalities.   Electronically Signed   By: Ulyses Southward M.D.   On: 04/19/2014 10:25   Ct Head Wo Contrast  04/19/2014  CLINICAL DATA:  Fall, dementia  EXAM: CT HEAD  WITHOUT CONTRAST  CT CERVICAL SPINE WITHOUT CONTRAST  TECHNIQUE: Multidetector CT imaging of the head and cervical spine was performed following the standard protocol without intravenous contrast. Multiplanar CT image reconstructions of the cervical spine were also generated.  COMPARISON:  09/01/2012  FINDINGS: CT HEAD FINDINGS  No skull fracture is noted. Again noted central heterogeneous opacification left maxillary sinus. Thickening of the lateral wall of left maxillary sinus is stable. Stable cystic changes in left sphenoid bone.  There is new subdural collection along left hemisphere which is isodense measures about 9 mm thickness. Only small amount of anterior increased attenuation axial image 20 probable subacute blood products. No significant mass effect or midline shift. Stable cerebral atrophy. Stable periventricular and patchy subcortical chronic white matter disease. No definite acute cortical infarction. No mass lesion is noted.  CT CERVICAL SPINE FINDINGS  Axial images of the cervical spine shows no acute fracture or or subluxation. Atherosclerotic calcification bilateral carotid bifurcation. Computer processed images shows no acute fracture or subluxation. There is significant disc space flattening at C4-C5 level. Mild disc space flattening at C2-C3 and C5-C6 level. Mild disc space flattening at C6-C7 and C7-T1 level. Large anterior bridging osteophytes at C2-C3 C3-C4 and C4-C5 level. Degenerative changes C1-C2 articulation. Multilevel facet degenerative changes. There is no pneumothorax in visualized lung apices.  IMPRESSION: 1. There is new isodense subdural left hemispheric collection which measures 9 mm maximum thickness. Only small amount of subacute blood products anteriorly see axial image 20. No mass effect or midline shift. Stable atrophy and chronic white matter disease. No definite acute cortical infarction. No mass lesion is noted on this unenhanced scan 2. No cervical spine acute fracture  or subluxation. Degenerative changes as described above. These results were called by telephone at the time of interpretation on 04/19/2014 at 10:15 am to Dr. Bethann BerkshireJOSEPH ZAMMIT , who verbally acknowledged these results.   Electronically Signed   By: Natasha MeadLiviu  Pop M.D.   On: 04/19/2014 10:15   Ct Cervical Spine Wo Contrast  04/19/2014   CLINICAL DATA:  Fall, dementia  EXAM: CT HEAD WITHOUT CONTRAST  CT CERVICAL SPINE WITHOUT CONTRAST  TECHNIQUE: Multidetector CT imaging of the head and cervical spine was performed following the standard protocol without intravenous contrast. Multiplanar CT image reconstructions of the cervical spine were also generated.  COMPARISON:  09/01/2012  FINDINGS: CT HEAD FINDINGS  No skull fracture is noted. Again noted central heterogeneous opacification left maxillary sinus. Thickening of the lateral wall of left maxillary sinus is stable. Stable cystic changes in left sphenoid bone.  There is new subdural collection along left hemisphere which is isodense measures about 9 mm thickness. Only small amount of anterior increased attenuation axial image 20 probable subacute blood products. No significant mass effect or midline shift. Stable cerebral atrophy. Stable periventricular and patchy subcortical chronic white matter disease. No definite acute cortical infarction. No mass lesion is noted.  CT CERVICAL SPINE FINDINGS  Axial images of the cervical spine shows no acute fracture or or subluxation. Atherosclerotic calcification bilateral carotid bifurcation. Computer processed images shows no acute fracture or subluxation. There is significant disc space flattening at C4-C5 level. Mild disc space flattening at C2-C3 and C5-C6 level. Mild disc space flattening at C6-C7 and C7-T1 level. Large anterior bridging osteophytes at C2-C3 C3-C4 and C4-C5 level. Degenerative changes C1-C2 articulation. Multilevel facet degenerative changes. There is no pneumothorax in visualized lung apices.  IMPRESSION: 1.  There is  new isodense subdural left hemispheric collection which measures 9 mm maximum thickness. Only small amount of subacute blood products anteriorly see axial image 20. No mass effect or midline shift. Stable atrophy and chronic white matter disease. No definite acute cortical infarction. No mass lesion is noted on this unenhanced scan 2. No cervical spine acute fracture or subluxation. Degenerative changes as described above. These results were called by telephone at the time of interpretation on 04/19/2014 at 10:15 am to Dr. Bethann Berkshire , who verbally acknowledged these results.   Electronically Signed   By: Natasha Mead M.D.   On: 04/19/2014 10:15         Subjective:  patient is presently confused. No reports of fevers, chills, chest pain, respiratory distress, vomiting, diarrhea.  Objective: Filed Vitals:   04/20/14 2217 04/21/14 0523 04/21/14 0551 04/21/14 1552  BP: 155/84 171/108 142/80 172/92  Pulse: 76 85  89  Temp: 97.9 F (36.6 C) 98.1 F (36.7 C)  98.4 F (36.9 C)  TempSrc: Oral Oral  Oral  Resp: SpO2: 95% 96%  97%    Intake/Output Summary (Last 24 hours) at 04/21/14 1905 Last data filed at 04/21/14 1300  Gross per 24 hour  Intake 1235.83 ml  Output      0 ml  Net 1235.83 ml   Weight change:  Exam:   General:  Pt is alert, follows commands appropriately, not in acute distress  HEENT: No icterus, No thrush,  /AT  Cardiovascular: IRRR, S1/S2, no rubs, no gallops  Respiratory:  Poor inspiratory effort but clear to auscultation. No wheeze  Abdomen: Soft/+BS, non tender, non distended, no guarding  Extremities: No edema, No lymphangitis, No petechiae, No rashes, no synovitis  Data Reviewed: Basic Metabolic Panel:  Recent Labs Lab 04/19/14 0917 04/20/14 0515 04/21/14 0541  NA 138 136 138  K 4.5 4.3 4.0  CL 106 107 109  CO2 GLUCOSE 133* 130* 118*  BUN 29* 28* 29*  CREATININE 2.38* 2.28* 2.32*  CALCIUM 8.8 8.8 8.7   Liver  Function Tests:  Recent Labs Lab 04/19/14 0917  AST 17  ALT 13  ALKPHOS 106  BILITOT 1.4*  PROT 6.9  ALBUMIN 3.8   No results for input(s): LIPASE, AMYLASE in the last 168 hours. No results for input(s): AMMONIA in the last 168 hours. CBC:  Recent Labs Lab 04/19/14 0917 04/20/14 0515  WBC 6.7 6.7  NEUTROABS 5.3  --   HGB 11.6* 10.9*  HCT 35.6* 33.1*  MCV 94.7 94.0  PLT 140* 131*   Cardiac Enzymes:  Recent Labs Lab 04/19/14 0917  CKTOTAL 61  TROPONINI 0.03   BNP: Invalid input(s): POCBNP CBG: No results for input(s): GLUCAP in the last 168 hours.  No results found for this or any previous visit (from the past 240 hour(s)).   Scheduled Meds: . cholecalciferol  1,000 Units Oral Daily  . galantamine  8 mg Oral BID  . pantoprazole  40 mg Oral Daily  . sertraline  50 mg Oral Daily   Continuous Infusions: . sodium chloride 50 mL/hr at 04/20/14 2141     Srinidhi Landers, DO  [facility] Pager [phone]  If 7PM-7AM, please contact night-coverage [email or web address] Password [email or web address] 04/21/2014, 7:05 PM   LOS: 2 days

## 2014-04-22 MED ORDER — ASPIRIN 81 MG PO TABS: 81.0000 mg | ORAL_TABLET | Freq: Every day | ORAL | Status: DC

## 2014-04-22 NOTE — Discharge Summary (Signed)
Physician Discharge Summary  Patrick Murray ZOX:[id number] DOB: 03/30/1917 DOA: 04/19/2014  PCP: Kimber RelicGREEN, ARTHUR G, MD  Admit date: 04/19/2014 Discharge date: 04/22/2014  Recommendations for Outpatient Follow-up:  1. Pt will need to follow up with PCP in 2 weeks post discharge 2. Please obtain BMP in one week 3. Please obtain speech therapy evaluation 4. Resume ASA 81 mg on 05/06/14  Discharge Diagnoses:  Subdural hematoma -No intervention at this time per neurosurgery -Supportive care -Continue neuro checks--mental status continues to improve  -Physical therapy evaluation-->SNF - the patient had a choking episode when drinking fluids with a straw  -Recommend speech therapy evaluation after discharge--spoke with the caretaker to avoid using straws -can resume ASA 81 mg daily in 2 weeks Mechanical fall versus syncope -pt found on floor by caregiver -04/19/2014 echocardiogram shows Inf Wall HK which appears to be new from his previous echo -consulted cardiology to also assist in clarifying if he is having long runs or tachycardia -placed on tele--not concerning dysrhythmias -cardiology interrogated PPM--> no concerning dysrhythmia -hold ASA for now -The patient was orthostatic which possibly contributed to his fall Orthostatic hypotension -continue IVF through the hospitalization -minimize antiHTN if possible -appreciate cardiology eval--no further cardiac eval at this time CKD stage IV -Based on creatinine 2.3-2.5 -Patient is at his baseline Dementia -continue Razadyne - pleasant confused at baseline Permanent Atrial Fibrillation -question if pt having any prolonged RVR-- cardiology evaluated PPM--> no concerning dysrhythmia -04/19/14 echo with wall motion abnormalities -Rate controlled presently -Not a candidate for anticoagulation secondary to frequent falls -Hold aspirin secondary to subdural hematoma -CHADS-VASc= 4 Diabetes mellitus type 2 -Allow for liberal  glycemic control -03/27/2014 hemoglobin A1c 6.3  Hypertension -Given the patient's orthostatics at age, we'll allow for permissive hypertension and avoid aggressive treatment Family Communication: Caregiver updated at beside   Discharge Condition: stable  Disposition: SNF  Diet: heart healthy Wt Readings from Last 3 Encounters:  11/22/13 70.761 kg (156 lb)  10/31/13 68.947 kg (152 lb)  10/18/13 70.806 kg (156 lb 1.6 oz)    History of present illness:  81 year old male with a history of permanent atrial fibrillation, SSS with PPM, dementia, diabetes mellitus, and CKD stage IV presented after he was found on the floor by his caregiver at his assisted living facility. The patient is unable to provide any history due to his dementia. The patient was awake and alert sitting up when he was found by his caregiver at 7 AM on 04/19/2014. CT of the brain in the emergency department revealed a small subdural hematoma. Neurosurgery was consulted and did not feel that the patient needed any interventions at this time. There was no mass effect or effacement. Since admission, the patient has been more alert but remains a little more confused than baseline according to his caregiver. Fortunately, the patient's mental status continued to improve throughout the hospitalization. At the time of discharge, the patient was near his baseline mental status.  Discharge Exam: Filed Vitals:   04/22/14 0527  BP: 173/91  Pulse: 62  Temp: 98 F (36.7 C)  Resp: 20   Filed Vitals:   04/21/14 0551 04/21/14 1552 04/21/14 2059 04/22/14 0527  BP: 142/80 172/92 183/80 173/91  Pulse:  89 86 62  Temp:  98.4 F (36.9 C) 98.2 F (36.8 C) 98 F (36.7 C)  TempSrc:  Oral Oral Oral  Resp:  21 20 20   SpO2:  97% 95% 93%   General: Awake and alert, NAD, pleasant, cooperative Cardiovascular: RRR, no rub,  no gallop, no S3 Respiratory: Bibasilar crackles. No wheezes  Abdomen:soft, nontender, nondistended, positive bowel  sounds Extremities: No edema, No lymphangitis, no petechiae  Discharge Instructions      Discharge Instructions    Diet - low sodium heart healthy    Complete by:  As directed      Increase activity slowly    Complete by:  As directed             Medication List    TAKE these medications        aspirin 81 MG tablet  Take 1 tablet (81 mg total) by mouth daily. Start on 05/06/14     dextromethorphan 30 MG/5ML liquid  Commonly known as:  DELSYM  Take 10 mLs (60 mg total) by mouth as needed for cough.     ENSURE  Take 237 mLs by mouth. One can daily     galantamine 8 MG tablet  Commonly known as:  RAZADYNE  TAKE 1 TABLET TWICE DAILY.     GLUCOSAMINE CHONDR COMPLEX PO  Take 1 capsule by mouth daily.     pantoprazole 40 MG tablet  Commonly known as:  PROTONIX  TAKE 1 TABLET DAILY TO REDUCE STOMACH ACID.     pyrithione zinc 1 % shampoo  Commonly known as:  SELSUN BLUE DRY SCALP  Shampoo at least 3 times weekly     SALINE NASAL SPRAY NA  Place into the nose. One spray in each nostril as needed     sertraline 50 MG tablet  Commonly known as:  ZOLOFT  TAKE ONE TABLET DAILY TO HELP WITH NERVES.     Vitamin D 2000 UNITS tablet  Take 1,000 Units by mouth daily.         The results of significant diagnostics from this hospitalization (including imaging, microbiology, ancillary and laboratory) are listed below for reference.    Significant Diagnostic Studies: Dg Lumbar Spine Complete  04/19/2014   CLINICAL DATA:  81 year old male with unwitnessed fall. Bruising. Initial encounter.  EXAM: LUMBAR SPINE - COMPLETE 4+ VIEW  COMPARISON:  None.  FINDINGS: Normal lumbar segmentation. Partially visible cardiac pacemaker lead. Suture material in the mid abdomen. Severe or complete disc space loss at L4-L5, there may be interbody ankylosis. There is moderate to severe facet hypertrophy from L3 to the sacrum. Bulky endplate osteophytes elsewhere in the lumbar spine with relatively  preserved disc spaces. No compression fracture. Both SI joints may be ankylosed. Aortoiliac calcified atherosclerosis noted. Grossly intact visible lower thoracic levels.  IMPRESSION: 1.  No acute fracture or listhesis identified in the lumbar spine. 2. 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   Electronically Signed   By: Odessa Fleming M.D.   On: 04/19/2014 10:24   Dg Pelvis 1-2 Views  04/19/2014   CLINICAL DATA:  Unwitnessed fall, history dementia, bruising to face and neck  EXAM: PELVIS - 1-2 VIEW  COMPARISON:  None  FINDINGS: Osseous demineralization.  Mild narrowing of the hip joints bilaterally.  SI joints poorly visualized due to degree of demineralization.  No acute fracture, dislocation or bone destruction.  Degenerative disc disease changes at visualized lower lumbar spine.  Scattered atherosclerotic calcifications.  IMPRESSION: Osseous demineralization.  No acute abnormalities.   Electronically Signed   By: Ulyses Southward M.D.   On: 04/19/2014 10:25   Ct Head Wo Contrast  04/19/2014   CLINICAL DATA:  Fall, dementia  EXAM: CT HEAD WITHOUT CONTRAST  CT CERVICAL SPINE WITHOUT CONTRAST  TECHNIQUE: Multidetector CT imaging of the head and cervical spine was performed following the standard protocol without intravenous contrast. Multiplanar CT image reconstructions of the cervical spine were also generated.  COMPARISON:  09/01/2012  FINDINGS: CT HEAD FINDINGS  No skull fracture is noted. Again noted central heterogeneous opacification left maxillary sinus. Thickening of the lateral wall of left maxillary sinus is stable. Stable cystic changes in left sphenoid bone.  There is new subdural collection along left hemisphere which is isodense measures about 9 mm thickness. Only small amount of anterior increased attenuation axial image 20 probable subacute blood products. No significant mass effect or midline shift. Stable cerebral atrophy. Stable periventricular and patchy subcortical chronic  white matter disease. No definite acute cortical infarction. No mass lesion is noted.  CT CERVICAL SPINE FINDINGS  Axial images of the cervical spine shows no acute fracture or or subluxation. Atherosclerotic calcification bilateral carotid bifurcation. Computer processed images shows no acute fracture or subluxation. There is significant disc space flattening at C4-C5 level. Mild disc space flattening at C2-C3 and C5-C6 level. Mild disc space flattening at C6-C7 and C7-T1 level. Large anterior bridging osteophytes at C2-C3 C3-C4 and C4-C5 level. Degenerative changes C1-C2 articulation. Multilevel facet degenerative changes. There is no pneumothorax in visualized lung apices.  IMPRESSION: 1. There is new isodense subdural left hemispheric collection which measures 9 mm maximum thickness. Only small amount of subacute blood products anteriorly see axial image 20. No mass effect or midline shift. Stable atrophy and chronic white matter disease. No definite acute cortical infarction. No mass lesion is noted on this unenhanced scan 2. No cervical spine acute fracture or subluxation. Degenerative changes as described above. These results were called by telephone at the time of interpretation on 04/19/2014 at 10:15 am to Dr. Bethann Berkshire , who verbally acknowledged these results.   Electronically Signed   By: Natasha Mead M.D.   On: 04/19/2014 10:15   Ct Cervical Spine Wo Contrast  04/19/2014   CLINICAL DATA:  Fall, dementia  EXAM: CT HEAD WITHOUT CONTRAST  CT CERVICAL SPINE WITHOUT CONTRAST  TECHNIQUE: Multidetector CT imaging of the head and cervical spine was performed following the standard protocol without intravenous contrast. Multiplanar CT image reconstructions of the cervical spine were also generated.  COMPARISON:  09/01/2012  FINDINGS: CT HEAD FINDINGS  No skull fracture is noted. Again noted central heterogeneous opacification left maxillary sinus. Thickening of the lateral wall of left maxillary sinus is  stable. Stable cystic changes in left sphenoid bone.  There is new subdural collection along left hemisphere which is isodense measures about 9 mm thickness. Only small amount of anterior increased attenuation axial image 20 probable subacute blood products. No significant mass effect or midline shift. Stable cerebral atrophy. Stable periventricular and patchy subcortical chronic white matter disease. No definite acute cortical infarction. No mass lesion is noted.  CT CERVICAL SPINE FINDINGS  Axial images of the cervical spine shows no acute fracture or or subluxation. Atherosclerotic calcification bilateral carotid bifurcation. Computer processed images shows no acute fracture or subluxation. There is significant disc space flattening at C4-C5 level. Mild disc space flattening at C2-C3 and C5-C6 level. Mild disc space flattening at C6-C7 and C7-T1 level. Large anterior bridging osteophytes at C2-C3 C3-C4 and C4-C5 level. Degenerative changes C1-C2 articulation. Multilevel facet degenerative changes. There is no pneumothorax in visualized lung apices.  IMPRESSION: 1. There is new isodense subdural left hemispheric collection which measures 9 mm maximum thickness. Only small amount of subacute blood products  anteriorly see axial image 20. No mass effect or midline shift. Stable atrophy and chronic white matter disease. No definite acute cortical infarction. No mass lesion is noted on this unenhanced scan 2. No cervical spine acute fracture or subluxation. Degenerative changes as described above. These results were called by telephone at the time of interpretation on 04/19/2014 at 10:15 am to Dr. Bethann Berkshire , who verbally acknowledged these results.   Electronically Signed   By: Natasha Mead M.D.   On: 04/19/2014 10:15     Microbiology: No results found for this or any previous visit (from the past 240 hour(s)).   Labs: Basic Metabolic Panel:  Recent Labs Lab 04/19/14 0917 04/20/14 0515 04/21/14 0541    NA 138 136 138  K 4.5 4.3 4.0  CL 106 107 109  CO2 GLUCOSE 133* 130* 118*  BUN 29* 28* 29*  CREATININE 2.38* 2.28* 2.32*  CALCIUM 8.8 8.8 8.7   Liver Function Tests:  Recent Labs Lab 04/19/14 0917  AST 17  ALT 13  ALKPHOS 106  BILITOT 1.4*  PROT 6.9  ALBUMIN 3.8   No results for input(s): LIPASE, AMYLASE in the last 168 hours. No results for input(s): AMMONIA in the last 168 hours. CBC:  Recent Labs Lab 04/19/14 0917 04/20/14 0515  WBC 6.7 6.7  NEUTROABS 5.3  --   HGB 11.6* 10.9*  HCT 35.6* 33.1*  MCV 94.7 94.0  PLT 140* 131*   Cardiac Enzymes:  Recent Labs Lab 04/19/14 0917  CKTOTAL 61  TROPONINI 0.03   BNP: Invalid input(s): POCBNP CBG: No results for input(s): GLUCAP in the last 168 hours.  Time coordinating discharge:  Greater than 30 minutes  Signed:  Jarom Govan, DO [facility] Pager: [phone] 04/22/2014, 9:34 AM

## 2014-04-22 NOTE — Progress Notes (Signed)
Burna Fortsharles M Markman to be D/C'd Skilled nursing facility per MD order. [facility].     Medication List    TAKE these medications        aspirin 81 MG tablet  Take 1 tablet (81 mg total) by mouth daily. Start on 05/06/14     dextromethorphan 30 MG/5ML liquid  Commonly known as:  DELSYM  Take 10 mLs (60 mg total) by mouth as needed for cough.     ENSURE  Take 237 mLs by mouth. One can daily     galantamine 8 MG tablet  Commonly known as:  RAZADYNE  TAKE 1 TABLET TWICE DAILY.     GLUCOSAMINE CHONDR COMPLEX PO  Take 1 capsule by mouth daily.     pantoprazole 40 MG tablet  Commonly known as:  PROTONIX  TAKE 1 TABLET DAILY TO REDUCE STOMACH ACID.     pyrithione zinc 1 % shampoo  Commonly known as:  SELSUN BLUE DRY SCALP  Shampoo at least 3 times weekly     SALINE NASAL SPRAY NA  Place into the nose. One spray in each nostril as needed     sertraline 50 MG tablet  Commonly known as:  ZOLOFT  TAKE ONE TABLET DAILY TO HELP WITH NERVES.     Vitamin D 2000 UNITS tablet  Take 1,000 Units by mouth daily.        Filed Vitals:   04/22/14 0527  BP: 173/91  Pulse: 62  Temp: 98 F (36.7 C)  Resp: 20    Skin clean, dry and intact without evidence of skin break down, no evidence of skin tears noted. IV catheter discontinued intact. Site without signs and symptoms of complications. Dressing and pressure applied. Pt denies pain at this time. No complaints noted.  An After Visit Summary was printed and given to [facility].   Viviana SimplerDumas, Velena Keegan S 04/22/2014 1:04 PM

## 2014-04-22 NOTE — Progress Notes (Signed)
[facility].  CSW facilitated pt discharge needs including contacting facility and speaking with charge RN, Victorino DikeJennifer, faxing pt discharge information to facility and confirming that facility received discharge summary, providing RN phone number to call report, discussing with pt son, [name] at bedside, and arranging ambulance transport via [facility] for pt [facility].   Pt son, [name] appreciative of support and assistance with [facility] and hopeful that pt will be able to eat his lunch before transport arrives.  No further social work needs identified at this time.  CSW signing off.   Loletta SpecterSuzanna Kidd, MSW, LCSW Clinical Social Work [phone]

## 2014-04-25 ENCOUNTER — Encounter: Payer: Self-pay | Admitting: Internal Medicine

## 2014-04-25 ENCOUNTER — Encounter: Payer: Self-pay | Admitting: Nurse Practitioner

## 2014-04-25 ENCOUNTER — Non-Acute Institutional Stay (SKILLED_NURSING_FACILITY): Payer: Medicare Other | Admitting: Nurse Practitioner

## 2014-04-25 DIAGNOSIS — Z8719 Personal history of other diseases of the digestive system: Secondary | ICD-10-CM

## 2014-04-25 DIAGNOSIS — I6200 Nontraumatic subdural hemorrhage, unspecified: Secondary | ICD-10-CM | POA: Diagnosis not present

## 2014-04-25 DIAGNOSIS — F0390 Unspecified dementia without behavioral disturbance: Secondary | ICD-10-CM | POA: Diagnosis not present

## 2014-04-25 DIAGNOSIS — I10 Essential (primary) hypertension: Secondary | ICD-10-CM | POA: Diagnosis not present

## 2014-04-25 DIAGNOSIS — Z950 Presence of cardiac pacemaker: Secondary | ICD-10-CM | POA: Diagnosis not present

## 2014-04-25 DIAGNOSIS — N184 Chronic kidney disease, stage 4 (severe): Secondary | ICD-10-CM

## 2014-04-25 DIAGNOSIS — I482 Chronic atrial fibrillation, unspecified: Secondary | ICD-10-CM

## 2014-04-25 DIAGNOSIS — E0840 Diabetes mellitus due to underlying condition with diabetic neuropathy, unspecified: Secondary | ICD-10-CM

## 2014-04-25 DIAGNOSIS — F32A Depression, unspecified: Secondary | ICD-10-CM

## 2014-04-25 DIAGNOSIS — W19XXXS Unspecified fall, sequela: Secondary | ICD-10-CM | POA: Diagnosis not present

## 2014-04-25 DIAGNOSIS — S065X9A Traumatic subdural hemorrhage with loss of consciousness of unspecified duration, initial encounter: Secondary | ICD-10-CM

## 2014-04-25 DIAGNOSIS — S065XAA Traumatic subdural hemorrhage with loss of consciousness status unknown, initial encounter: Secondary | ICD-10-CM

## 2014-04-25 DIAGNOSIS — F329 Major depressive disorder, single episode, unspecified: Secondary | ICD-10-CM

## 2014-04-25 DIAGNOSIS — Z8711 Personal history of peptic ulcer disease: Secondary | ICD-10-CM

## 2014-04-25 NOTE — Assessment & Plan Note (Signed)
-  pt found on floor by caregiver -04/19/2014 echocardiogram shows Inf Wall HK which appears to be new from his previous echo -consulted cardiology to also assist in clarifying if he is having long runs or tachycardia -placed on tele--not concerning dysrhythmias -cardiology interrogated PPM--> no concerning dysrhythmia -The patient was orthostatic which possibly contributed to his fall

## 2014-04-25 NOTE — Assessment & Plan Note (Signed)
Creat 2s-f/u BMP

## 2014-04-25 NOTE — Assessment & Plan Note (Signed)
Resulted from fall in his apartment. No new focal neurological symptoms. Risk for fall

## 2014-04-25 NOTE — Assessment & Plan Note (Signed)
Mood is stable. Takes Sertraline 50mg  daily.

## 2014-04-25 NOTE — Assessment & Plan Note (Signed)
Functioning  

## 2014-04-25 NOTE — Assessment & Plan Note (Signed)
Forgetful and requires close supervision for safety and ADLs. Takes Galantamine 8mg .

## 2014-04-25 NOTE — Assessment & Plan Note (Signed)
-  question if pt having any prolonged RVR-- cardiology evaluated PPM--> no concerning dysrhythmia -04/19/14 echo with wall motion abnormalities -Rate controlled presently -Not a candidate for anticoagulation secondary to frequent falls

## 2014-04-25 NOTE — Assessment & Plan Note (Signed)
Takes Protonix 40mg daily. Asymptomatic presently.   

## 2014-04-25 NOTE — Assessment & Plan Note (Addendum)
Noted elevated Sbp in 160s-will continue to observe his blood pressure. Avoid ACEIs due to CKD. -Given the patient's orthostatics at age, we'll allow for permissive hypertension and avoid aggressive treatment

## 2014-04-25 NOTE — Assessment & Plan Note (Signed)
Heart rate is in control. Takes ASA 81mg daily.   

## 2014-04-25 NOTE — Assessment & Plan Note (Signed)
-  Allow for liberal glycemic control -03/27/2014 hemoglobin A1c 6.3

## 2014-04-25 NOTE — Progress Notes (Signed)
Patient ID: Patrick Murray, male   DOB: 05-19-1917, 81 y.o.   MRN: [id number]   Code Status: DNR  Allergies  Allergen Reactions  . Aricept [Donepezil Hcl] Other (See Comments)    Unknown   . Simvastatin Other (See Comments)    Unknown     Chief Complaint  Patient presents with  . Medical Management of Chronic Issues  . Hospitalization Follow-up    afib, fall, subdural hematoma    HPI: Patient is a 81 y.o. male seen in the SNF at [facility] today for evaluation of f/u hospitalization for fall, subdural hematoma and other chronic medical conditions.      Hospitalized from 04/19/2014--04/22/2014 for evaluation of s/p fall, subdural hematoma, and CKD   -pt found on floor by caregiver -04/19/2014 echocardiogram shows Inf Wall HK which appears to be new from his previous echo -consulted cardiology to also assist in clarifying if he is having long runs or tachycardia -placed on tele--not concerning dysrhythmias -cardiology interrogated PPM--> no concerning dysrhythmia -hold ASA for now -The patient was orthostatic which possibly contributed to his fall  04/19/2014. CT of the brain in the emergency department revealed a small subdural hematoma. Neurosurgery was consulted and did not feel that the patient needed any interventions at this time. There was no mass effect or effacement. Since admission, the patient has been more alert but remains a little more confused than baseline according to his caregiver. Fortunately, the patient's mental status continued to improve throughout the hospitalization. At the time of discharge, the patient was near his baseline mental status.       Problem List Items Addressed This Visit    Essential hypertension (Chronic)    Noted elevated Sbp in 160s-will continue to observe his blood pressure. Avoid ACEIs due to CKD. -Given the patient's orthostatics at age, we'll allow for permissive hypertension and avoid aggressive  treatment              Permanent atrial fibrillation (Chronic)    -question if pt having any prolonged RVR-- cardiology evaluated PPM--> no concerning dysrhythmia -04/19/14 echo with wall motion abnormalities -Rate controlled presently -Not a candidate for anticoagulation secondary to frequent falls      Dementia (Chronic)    Forgetful and requires close supervision for safety and ADLs. Takes Galantamine 8mg .       Diabetes mellitus with neurological manifestation (Chronic)    -Allow for liberal glycemic control -03/27/2014 hemoglobin A1c 6.3       Cardiac pacemaker - [employer] Zephyr 2010    Functioning.       History of gastric ulcer    Takes Protonix 40mg  daily. Asymptomatic presently.       Fall    -pt found on floor by caregiver -04/19/2014 echocardiogram shows Inf Wall HK which appears to be new from his previous echo -consulted cardiology to also assist in clarifying if he is having long runs or tachycardia -placed on tele--not concerning dysrhythmias -cardiology interrogated PPM--> no concerning dysrhythmia -The patient was orthostatic which possibly contributed to his fall      Subdural hematoma - Primary    Resulted from fall in his apartment. No new focal neurological symptoms. Risk for fall      CKD (chronic kidney disease) stage 4, GFR 15-29 ml/min    Creat 2s-f/u BMP      Depression    Mood is stable. Takes Sertraline 50mg  daily.          Review of Systems:  Review of Systems  Constitutional: Positive for fatigue. Negative for fever, chills, diaphoresis, activity change, appetite change and unexpected weight change.  HENT: Positive for hearing loss. Negative for congestion, ear pain, facial swelling and nosebleeds.   Eyes: Negative.        Corrective lenses.  Respiratory: Negative for chest tightness and shortness of breath. Cough: dry.   Cardiovascular: Negative for chest pain, palpitations and leg swelling.  Gastrointestinal: Negative  for abdominal pain and abdominal distention.       Cough with eating and drinking.  Endocrine: Negative for cold intolerance, heat intolerance, polydipsia, polyphagia and polyuria.       Diabetic.  Genitourinary: Negative.   Musculoskeletal: Positive for myalgias, arthralgias and gait problem. Negative for neck pain and neck stiffness.  Skin:       Inflamed right great toe and an area medially of ingrown nail that has been filed..  Neurological: Positive for dizziness and weakness. Negative for tremors, seizures, syncope, numbness and headaches.       Balance disturbance. Dementia.  Hematological: Negative.   Psychiatric/Behavioral: Positive for behavioral problems and confusion.     Past Medical History  Diagnosis Date  . Cardiac pacemaker in situ     a.fib w/slow ventricular response  . Atrial fibrillation   . Myalgia and myositis, unspecified   . Unspecified essential hypertension   . Other and unspecified hyperlipidemia   . Sinoatrial node dysfunction   . Acute posthemorrhagic anemia   . Depression   . Gastric ulcer   . Dementia   . Vitamin D deficiency   . Gait disorder   . Chronic kidney disease, stage II (mild)   . Acute gastric ulcer with hemorrhage, without mention of obstruction   . Other malaise and fatigue   . Edema   . Personal history of fall   . Hemorrhage of gastrointestinal tract, unspecified 10/13/11  . Muscle weakness (generalized)   . Anemia, unspecified   . Other specified disease of sebaceous glands     xerosis cutis  . Unspecified pruritic disorder   . Unspecified urinary incontinence 01/01/10  . Unspecified hearing loss   . Peripheral vascular disease, unspecified   . Syncope and collapse 08/08/2008  . Abrasion of face   . Cough   . Type II or unspecified type diabetes mellitus with renal manifestations, not stated as uncontrolled    Past Surgical History  Procedure Laterality Date  . No past surgeries    . Esophagogastroduodenoscopy   10/08/2011    Procedure: ESOPHAGOGASTRODUODENOSCOPY (EGD);  Surgeon: Willis Modena, MD;  Location: [facility];  Service: Endoscopy;  Laterality: N/A;  . Pacemaker generator change  08/05/07    [employer]  . Pacemaker insertion  2003  . Appendectomy  1926  . Cholecystectomy  1956  . Tonsillectomy  1926   Social History:   reports that he has quit smoking. His smoking use included Pipe. He has never used smokeless tobacco. He reports that he does not drink alcohol or use illicit drugs.  Family History  Problem Relation Age of Onset  . Heart attack Mother     had a couple  . Heart failure Mother   . Emphysema Father   . Cancer Sister     lung    Medications: Patient's Medications  New Prescriptions   No medications on file  Previous Medications   ASPIRIN 81 MG TABLET    Take 1 tablet (81 mg total) by mouth daily. Start on 05/06/14   CHOLECALCIFEROL (VITAMIN  D) 2000 UNITS TABLET    Take 1,000 Units by mouth daily.    DEXTROMETHORPHAN (DELSYM) 30 MG/5ML LIQUID    Take 10 mLs (60 mg total) by mouth as needed for cough.   ENSURE (ENSURE)    Take 237 mLs by mouth. One can daily   GALANTAMINE (RAZADYNE) 8 MG TABLET    TAKE 1 TABLET TWICE DAILY.   GLUCOSAMINE-CHONDROITIN (GLUCOSAMINE CHONDR COMPLEX PO)    Take 1 capsule by mouth daily.   PANTOPRAZOLE (PROTONIX) 40 MG TABLET    TAKE 1 TABLET DAILY TO REDUCE STOMACH ACID.   PYRITHIONE ZINC (SELSUN BLUE DRY SCALP) 1 % SHAMPOO    Shampoo at least 3 times weekly   SALINE NASAL SPRAY NA    Place into the nose. One spray in each nostril as needed   SERTRALINE (ZOLOFT) 50 MG TABLET    TAKE ONE TABLET DAILY TO HELP WITH NERVES.  Modified Medications   No medications on file  Discontinued Medications   No medications on file     Physical Exam: Physical Exam  Constitutional:  Frail elderly male  HENT:  Bilateral hearing aids. Loss of hearing.l. No bleeding or scabs. Tympanic membranes have some scarring bilaterally but no acute injury.   Eyes: Conjunctivae are normal. Pupils are equal, round, and reactive to light.  Neck: Normal range of motion. Neck supple. No JVD present. No tracheal deviation present. No thyromegaly present.  Deviated spine of the nose.  Cardiovascular: Normal rate and intact distal pulses.  Exam reveals no gallop and no friction rub.   No murmur heard. Atrial fib Diminished DP and PT bilaterally  Pulmonary/Chest: Effort normal. No respiratory distress. He has no wheezes. He has no rales.  Abdominal: Soft. Bowel sounds are normal. He exhibits no distension and no mass. There is no tenderness.  Musculoskeletal: Normal range of motion. He exhibits no edema or tenderness.  Poor balance.  Lymphadenopathy:    He has no cervical adenopathy.  Neurological: He is alert. No cranial nerve deficit. Coordination abnormal.  11/22/13 MMSE 17/30. Failed clock drawing. Diminished sensation to vibration and to monofilament testing bilaterally.  Skin: Skin is warm and dry. No rash noted. No erythema. No pallor.  Healed ulcer of the left foot at the proximal 5th metatarsal head. Residual scaly area.   Psychiatric: He has a normal mood and affect.  Cheerful. Forgetful.   Filed Vitals:   04/25/14 1133  BP: 170/86  Pulse: 70  Temp: 98.2 F (36.8 C)  TempSrc: Tympanic  Resp: 20      Labs reviewed: Basic Metabolic Panel:  Recent Labs  81/19/1402/04/22 04/19/14 0917 04/20/14 0515 04/21/14 0541  NA 137 138 136 138  K 4.4 4.5 4.3 4.0  CL  --  106 107 109  CO2  --  23 20 21   GLUCOSE  --  133* 130* 118*  BUN 31* 29* 28* 29*  CREATININE 2.4* 2.38* 2.28* 2.32*  CALCIUM  --  8.8 8.8 8.7  TSH 1.48  --   --  1.217   Liver Function Tests:  Recent Labs  03/27/14 04/10/14 04/19/14 0917  AST 18 12* 17  ALT 10 10 13   ALKPHOS 103 106 106  BILITOT  --   --  1.4*  PROT  --   --  6.9  ALBUMIN  --   --  3.8   No results for input(s): LIPASE, AMYLASE in the last 8760 hours. No results for input(s): AMMONIA in the  last 8760 hours.  CBC:  Recent Labs  04/10/14 04/19/14 0917 04/20/14 0515  WBC 6.5 6.7 6.7  NEUTROABS  --  5.3  --   HGB 10.6* 11.6* 10.9*  HCT 32* 35.6* 33.1*  MCV  --  94.7 94.0  PLT 150 140* 131*   Lipid Panel:  Recent Labs  11/14/13 03/27/14  CHOL 162 131  HDL 39 39  LDLCALC 97 73  TRIG 128 97    Past Procedures:  04/19/14 CT head and cervical spine wo contrast:  MPRESSION: 1. There is new isodense subdural left hemispheric collection which measures 9 mm maximum thickness. Only small amount of subacute blood products anteriorly see axial image 20. No mass effect or midline shift. Stable atrophy and chronic white matter disease. No definite acute cortical infarction. No mass lesion is noted on this unenhanced scan 2. No cervical spine acute fracture or subluxation. Degenerative changes as described above.   Assessment/Plan Subdural hematoma Resulted from fall in his apartment. No new focal neurological symptoms. Risk for fall   CKD (chronic kidney disease) stage 4, GFR 15-29 ml/min Creat 2s-f/u BMP   Chronic atrial fibrillation Heart rate is in control. Takes ASA  daily.    Dementia Forgetful and requires close supervision for safety and ADLs. Takes Galantamine .    History of gastric ulcer Takes Protonix  daily. Asymptomatic presently.    Essential hypertension Noted elevated Sbp in 160s-will continue to observe his blood pressure. Avoid ACEIs due to CKD. -Given the patient's orthostatics at age, we'll allow for permissive hypertension and avoid aggressive treatment           Depression Mood is stable. Takes Sertraline  daily.    Cardiac pacemaker - [employer] Jude Zephyr 2010 Functioning.    Diabetes mellitus with neurological manifestation -Allow for liberal glycemic control -03/27/2014 hemoglobin A1c 6.3    Permanent atrial fibrillation -question if pt having any prolonged RVR-- cardiology evaluated PPM--> no  concerning dysrhythmia -04/19/14 echo with wall motion abnormalities -Rate controlled presently -Not a candidate for anticoagulation secondary to frequent falls   Fall -pt found on floor by caregiver -04/19/2014 echocardiogram shows Inf Wall HK which appears to be new from his previous echo -consulted cardiology to also assist in clarifying if he is having long runs or tachycardia -placed on tele--not concerning dysrhythmias -cardiology interrogated PPM--> no concerning dysrhythmia -The patient was orthostatic which possibly contributed to his fall     Family/ Staff Communication: observe the patient.   Goals of Care: [place] vs [place]  Labs/tests ordered: BMP

## 2014-04-27 ENCOUNTER — Encounter: Payer: Self-pay | Admitting: Internal Medicine

## 2014-04-27 ENCOUNTER — Non-Acute Institutional Stay (SKILLED_NURSING_FACILITY): Payer: Medicare Other | Admitting: Internal Medicine

## 2014-04-27 DIAGNOSIS — R58 Hemorrhage, not elsewhere classified: Secondary | ICD-10-CM | POA: Diagnosis not present

## 2014-04-27 DIAGNOSIS — I10 Essential (primary) hypertension: Secondary | ICD-10-CM

## 2014-04-27 DIAGNOSIS — I6200 Nontraumatic subdural hemorrhage, unspecified: Secondary | ICD-10-CM | POA: Diagnosis not present

## 2014-04-27 DIAGNOSIS — E0840 Diabetes mellitus due to underlying condition with diabetic neuropathy, unspecified: Secondary | ICD-10-CM | POA: Diagnosis not present

## 2014-04-27 DIAGNOSIS — S065X9A Traumatic subdural hemorrhage with loss of consciousness of unspecified duration, initial encounter: Secondary | ICD-10-CM

## 2014-04-27 DIAGNOSIS — N184 Chronic kidney disease, stage 4 (severe): Secondary | ICD-10-CM | POA: Diagnosis not present

## 2014-04-27 DIAGNOSIS — S065XAA Traumatic subdural hemorrhage with loss of consciousness status unknown, initial encounter: Secondary | ICD-10-CM

## 2014-04-27 DIAGNOSIS — R1314 Dysphagia, pharyngoesophageal phase: Secondary | ICD-10-CM

## 2014-04-27 DIAGNOSIS — H9193 Unspecified hearing loss, bilateral: Secondary | ICD-10-CM

## 2014-04-27 DIAGNOSIS — E785 Hyperlipidemia, unspecified: Secondary | ICD-10-CM

## 2014-04-27 DIAGNOSIS — F0390 Unspecified dementia without behavioral disturbance: Secondary | ICD-10-CM | POA: Diagnosis not present

## 2014-04-27 DIAGNOSIS — R2681 Unsteadiness on feet: Secondary | ICD-10-CM | POA: Diagnosis not present

## 2014-04-27 LAB — BASIC METABOLIC PANEL
BUN: 25 mg/dL — AB (ref 4–21)
Creatinine: 2.1 mg/dL — AB (ref 0.6–1.3)
Glucose: 107 mg/dL
Potassium: 4.1 mmol/L (ref 3.4–5.3)
Sodium: 141 mmol/L (ref 137–147)

## 2014-04-27 NOTE — Progress Notes (Signed)
Patient ID: Patrick Murray, male   DOB: 02/04/1917, 81 y.o.   MRN: [id number]    HISTORY AND PHYSICAL  Location:  [place] Room Number: [place] of Service: SNF (31)   Extended Emergency Contact Information Primary Emergency Contact: Shaya, [place], [place] [address] Johnnette Litter of [place] Work Phone: [phone] Mobile Phone: [phone] Relation: Son Secondary Emergency Contact: Maryjane Hurter, [place] [address] Johnnette Litter of [place] Phone: [phone] Mobile Phone: [phone] Relation: Son  Advanced Directive information Does patient have an advance directive?: Yes, Type of Advance Directive: Healthcare Power of [place];Out of facility DNR (pink MOST or yellow form), Pre-existing out of facility DNR order (yellow form or pink MOST form): Yellow form placed in chart (order not valid for inpatient use) ([place]), Does patient want to make changes to advanced directive?: No - Patient declined  Chief Complaint  Patient presents with  . New Admit To SNF    HPI:  Admitted to skilled nursing facility 04/22/2014. Patient was hospitalized 04/19/2014 through 04/22/2014. He had fallen in his apartment. This is one of multiple falls. He is very unstable on his feet. There are multiple ecchymoses from a previous fall in his chest, neck, and face of the time of his admission. CT scan of the brain showed a left subdural hematoma, which was a new finding. Chronic cerebral atrophy and small vessel disease was also noted. Dr. Dayton Bailiff, neurosurgeon was consulted and felt the patient was stable enough to simply observe this new problem.  There are multiple other problems including a significant dementia. He is taking galantamine for this. Patient really should be at a higher level than independent living. He has been approached on multiple occasions to move to assisted living, but is adamantly refused. His son has supported  him in this and hired a Actuary who comes to his independent living apartment to look over him and assist in his care every day.  He has a pacemaker for sick sinus syndrome and has chronic atrial fibrillation.  Gait is very unstable as noted above  CK D with a BUN of 31 and creatinine of 2.35 was noted. Diabetes mellitus type 2 is under control, but is complicated by the renal disease.  Hypertension is also controlled. Blood pressures on admission to skilled nursing facility were slightly elevated, but generally have been under control since admission.   He is very frail.   He has a known chronic anemia with last hemoglobin being 10.6, MCV 93.1.   Vitamin D deficiency supplemented with vitamin D.  Past Medical History  Diagnosis Date  . Cardiac pacemaker in situ     a.fib w/slow ventricular response  . Atrial fibrillation   . Myalgia and myositis, unspecified   . Unspecified essential hypertension   . Other and unspecified hyperlipidemia   . Sinoatrial node dysfunction   . Acute posthemorrhagic anemia   . Depression   . Gastric ulcer   . Dementia   . Vitamin D deficiency   . Gait disorder   . Chronic kidney disease, stage II (mild)   . Acute gastric ulcer with hemorrhage, without mention of obstruction   . Other malaise and fatigue   . Edema   . Personal history of fall   . Hemorrhage of gastrointestinal tract, unspecified 10/13/11  . Muscle weakness (generalized)   . Anemia, unspecified   . Other  specified disease of sebaceous glands     xerosis cutis  . Unspecified pruritic disorder   . Unspecified urinary incontinence 01/01/10  . Unspecified hearing loss   . Peripheral vascular disease, unspecified   . Syncope and collapse 08/08/2008  . Abrasion of face   . Cough   . Type II or unspecified type diabetes mellitus with renal manifestations, not stated as uncontrolled   . Subdural hematoma 04/19/2014    04/19/14 CT head and cervical spine: MPRESSION: 1. There is new  isodense subdural left hemispheric collection which measures 9 mm maximum thickness. Only small amount of subacute blood products anteriorly see axial image 20. No mass effect or midline shift. Stable atrophy and chronic white matter disease. No definite acute cortical infarction. No mass lesion is noted on this unenhanced scan 2. No cervical spine acute fracture or subluxation. Degenerative changes as described above.   . Fall 10/07/2011  . Ecchymosis 04/04/2014  . CKD (chronic kidney disease) stage 4, GFR 15-29 ml/min 04/20/2014  . Chronic atrial fibrillation     Past Surgical History  Procedure Laterality Date  . No past surgeries    . Esophagogastroduodenoscopy  10/08/2011    Procedure: ESOPHAGOGASTRODUODENOSCOPY (EGD);  Surgeon: Arta Silence, MD;  Location: [facility];  Service: Endoscopy;  Laterality: N/A;  . Pacemaker generator change  08/05/07    [place]  . Pacemaker insertion  2003  . Appendectomy  1926  . Cholecystectomy  1956  . Tonsillectomy  1926    Patient Care Team: Estill Dooms, MD as PCP - General (Internal Medicine) [facility] Sanda Klein, MD as Consulting Physician (Cardiology) Man Mast X, NP as Nurse Practitioner (Nurse Practitioner)  History   Social History  . Marital Status: Married    Spouse Name: N/A  . Number of Children: N/A  . Years of Education: N/A   Occupational History  . retired     Social History Main Topics  . Smoking status: Former Smoker -- 5 years    Types: Pipe  . Smokeless tobacco: Never Used  . Alcohol Use: No  . Drug Use: No  . Sexual Activity: No   Other Topics Concern  . Not on file   Social History Narrative   Retired. Walks 5 miles for exercise. Does not use ethanol.    L[facility] at [facility]    Was in [place]   Has Pacemaker   Living Will     reports that he has quit smoking. His smoking use included Pipe. He has never used smokeless tobacco. He reports that he does not drink alcohol or use illicit  drugs.  Family History  Problem Relation Age of Onset  . Heart attack Mother     had a couple  . Heart failure Mother   . Emphysema Father   . Cancer Sister     lung   Family Status  Relation Status Death Age  . Mother Deceased 13  . Father Deceased 76  . Sister Deceased 65  . Son Alive   . Son Alive   . Son Alive     Immunization History  Administered Date(s) Administered  . Influenza Split 10/08/2011  . Influenza Whole 10/27/2012  . Influenza-Unspecified 10/20/2013  . Pneumococcal Polysaccharide-23 10/06/2000  . Tdap 04/05/2012  . Zoster 11/06/2005    Allergies  Allergen Reactions  . Aricept [Donepezil Hcl] Other (See Comments)    Unknown   . Simvastatin Other (See Comments)    Unknown  Medications: Patient's Medications  New Prescriptions   No medications on file  Previous Medications   ASPIRIN 81 MG TABLET    Take 1 tablet (81 mg total) by mouth daily. Start on 05/06/14   CHOLECALCIFEROL (VITAMIN D) 2000 UNITS TABLET    Take 1,000 Units by mouth daily.    DEXTROMETHORPHAN (DELSYM) 30 MG/5ML LIQUID    Take 10 mLs (60 mg total) by mouth as needed for cough.   ENSURE (ENSURE)    Take 237 mLs by mouth. One can daily   GALANTAMINE (RAZADYNE) 8 MG TABLET    TAKE 1 TABLET TWICE DAILY.   GLUCOSAMINE-CHONDROITIN (GLUCOSAMINE CHONDR COMPLEX PO)    Take 1 capsule by mouth daily.   PANTOPRAZOLE (PROTONIX) 40 MG TABLET    TAKE 1 TABLET DAILY TO REDUCE STOMACH ACID.   PYRITHIONE ZINC (SELSUN BLUE DRY SCALP) 1 % SHAMPOO    Shampoo at least 3 times weekly   SALINE NASAL SPRAY NA    Place into the nose. One spray in each nostril as needed   SERTRALINE (ZOLOFT) 50 MG TABLET    TAKE ONE TABLET DAILY TO HELP WITH NERVES.  Modified Medications   No medications on file  Discontinued Medications   No medications on file    Review of Systems  Constitutional: Positive for fatigue. Negative for fever, chills, diaphoresis, activity change, appetite change and unexpected weight  change.  HENT: Positive for hearing loss. Negative for congestion, ear pain, facial swelling and nosebleeds.   Eyes: Negative.        Corrective lenses.  Respiratory: Negative for chest tightness and shortness of breath. Cough: dry.   Cardiovascular: Negative for chest pain, palpitations and leg swelling.  Gastrointestinal: Negative for abdominal pain and abdominal distention.       Cough with eating and drinking.  Endocrine: Negative for cold intolerance, heat intolerance, polydipsia, polyphagia and polyuria.       Diabetic.  Genitourinary: Negative.   Musculoskeletal: Positive for myalgias, arthralgias and gait problem. Negative for neck pain and neck stiffness.  Skin: Negative for pallor, rash and wound.  Neurological: Positive for dizziness and weakness. Negative for tremors, seizures, syncope, numbness and headaches.       Balance disturbance. Dementia.  Hematological: Negative.   Psychiatric/Behavioral: Positive for behavioral problems and confusion.    Filed Vitals:   04/27/14 1452  BP: 152/92  Pulse: 72  Temp: 97.4 F (36.3 C)  Resp: 22  Height: _0  (1.727 m)  Weight: 156 lb (70.761 kg)   Body mass index is 23.73 kg/(m^2).  Physical Exam  Constitutional:  Frail elderly male  HENT:  Bilateral hearing aids. Loss of hearing. Rusty wax present more prominently in the left external auditory canal. No bleeding or scabs. Tympanic membranes have some scarring bilaterally but no acute injury.  Eyes: Conjunctivae are normal. Pupils are equal, round, and reactive to light.  Neck: Normal range of motion. Neck supple. No JVD present. No tracheal deviation present. No thyromegaly present.  Deviated spine of the nose.  Cardiovascular: Normal rate and intact distal pulses.  Exam reveals no gallop and no friction rub.   No murmur heard. Atrial fib Diminished DP and PT bilaterally  Pulmonary/Chest: Effort normal. No respiratory distress. He has no wheezes. He has no rales.    Bronchial rattle.  Abdominal: Soft. Bowel sounds are normal. He exhibits no distension and no mass. There is no tenderness.  Musculoskeletal: Normal range of motion. He exhibits no edema or tenderness.  Poor  balance.  Lymphadenopathy:    He has no cervical adenopathy.  Neurological: He is alert. No cranial nerve deficit. Coordination abnormal.  11/22/13 MMSE 17/30. Failed clock drawing. Diminished sensation to vibration and to monofilament testing bilaterally.  Skin: Skin is warm and dry. No rash noted. No erythema. No pallor.  Healed ulcer of the left foot at the proximal 5th metatarsal head. Residual scaly area.  Psychiatric: He has a normal mood and affect.  Cheerful. Forgetful.     Labs reviewed: Admission on 04/19/2014, Discharged on 04/22/2014  Component Date Value Ref Range Status  . WBC 04/19/2014 6.7  4.0 - 10.5 K/uL Final  . RBC 04/19/2014 3.76* 4.22 - 5.81 MIL/uL Final  . Hemoglobin 04/19/2014 11.6* 13.0 - 17.0 g/dL Final  . HCT 04/19/2014 35.6* 39.0 - 52.0 % Final  . MCV 04/19/2014 94.7  78.0 - 100.0 fL Final  . MCH 04/19/2014 30.9  26.0 - 34.0 pg Final  . MCHC 04/19/2014 32.6  30.0 - 36.0 g/dL Final  . RDW 04/19/2014 13.5  11.5 - 15.5 % Final  . Platelets 04/19/2014 140* 150 - 400 K/uL Final  . Neutrophils Relative % 04/19/2014 79* 43 - 77 % Final  . Neutro Abs 04/19/2014 5.3  1.7 - 7.7 K/uL Final  . Lymphocytes Relative 04/19/2014 11* 12 - 46 % Final  . Lymphs Abs 04/19/2014 0.8  0.7 - 4.0 K/uL Final  . Monocytes Relative 04/19/2014 9  3 - 12 % Final  . Monocytes Absolute 04/19/2014 0.6  0.1 - 1.0 K/uL Final  . Eosinophils Relative 04/19/2014 1  0 - 5 % Final  . Eosinophils Absolute 04/19/2014 0.1  0.0 - 0.7 K/uL Final  . Basophils Relative 04/19/2014 0  0 - 1 % Final  . Basophils Absolute 04/19/2014 0.0  0.0 - 0.1 K/uL Final  . Sodium 04/19/2014 138  135 - 145 mmol/L Final  . Potassium 04/19/2014 4.5  3.5 - 5.1 mmol/L Final  . Chloride 04/19/2014 106  96 - 112  mmol/L Final  . CO2 04/19/2014 23  19 - 32 mmol/L Final  . Glucose, Bld 04/19/2014 133* 70 - 99 mg/dL Final  . BUN 04/19/2014 29* 6 - 23 mg/dL Final  . Creatinine, Ser 04/19/2014 2.38* 0.50 - 1.35 mg/dL Final  . Calcium 04/19/2014 8.8  8.4 - 10.5 mg/dL Final  . Total Protein 04/19/2014 6.9  6.0 - 8.3 g/dL Final  . Albumin 04/19/2014 3.8  3.5 - 5.2 g/dL Final  . AST 04/19/2014 17  0 - 37 U/L Final  . ALT 04/19/2014 13  0 - 53 U/L Final  . Alkaline Phosphatase 04/19/2014 106  39 - 117 U/L Final  . Total Bilirubin 04/19/2014 1.4* 0.3 - 1.2 mg/dL Final  . GFR calc non Af Amer 04/19/2014 22* >90 mL/min Final  . GFR calc Af Amer 04/19/2014 25* >90 mL/min Final   Comment: (NOTE) The eGFR has been calculated using the CKD EPI equation. This calculation has not been validated in all clinical situations. eGFR's persistently <90 mL/min signify possible Chronic Kidney Disease.   . Anion gap 04/19/2014 9  5 - 15 Final  . Color, Urine 04/19/2014 YELLOW  YELLOW Final  . APPearance 04/19/2014 CLEAR  CLEAR Final  . Specific Gravity, Urine 04/19/2014 1.017  1.005 - 1.030 Final  . pH 04/19/2014 5.5  5.0 - 8.0 Final  . Glucose, UA 04/19/2014 NEGATIVE  NEGATIVE mg/dL Final  . Hgb urine dipstick 04/19/2014 NEGATIVE  NEGATIVE Final  . Bilirubin Urine  04/19/2014 NEGATIVE  NEGATIVE Final  . Ketones, ur 04/19/2014 NEGATIVE  NEGATIVE mg/dL Final  . Protein, ur 04/19/2014 30* NEGATIVE mg/dL Final  . Urobilinogen, UA 04/19/2014 1.0  0.0 - 1.0 mg/dL Final  . Nitrite 04/19/2014 NEGATIVE  NEGATIVE Final  . Leukocytes, UA 04/19/2014 NEGATIVE  NEGATIVE Final  . Total CK 04/19/2014 61  7 - 232 U/L Final  . Troponin I 04/19/2014 0.03  <0.031 ng/mL Final   Comment:        NO INDICATION OF MYOCARDIAL INJURY.   Marland Kitchen Squamous Epithelial / LPF 04/19/2014 RARE  RARE Final  . WBC, UA 04/19/2014 3-6  <3 WBC/hpf Final  . RBC / HPF 04/19/2014 0-2  <3 RBC/hpf Final  . Urine-Other 04/19/2014 MUCOUS PRESENT   Final  . Sodium  04/20/2014 136  135 - 145 mmol/L Final  . Potassium 04/20/2014 4.3  3.5 - 5.1 mmol/L Final  . Chloride 04/20/2014 107  96 - 112 mmol/L Final  . CO2 04/20/2014 20  19 - 32 mmol/L Final  . Glucose, Bld 04/20/2014 130* 70 - 99 mg/dL Final  . BUN 04/20/2014 28* 6 - 23 mg/dL Final  . Creatinine, Ser 04/20/2014 2.28* 0.50 - 1.35 mg/dL Final  . Calcium 04/20/2014 8.8  8.4 - 10.5 mg/dL Final  . GFR calc non Af Amer 04/20/2014 23* >90 mL/min Final  . GFR calc Af Amer 04/20/2014 27* >90 mL/min Final   Comment: (NOTE) The eGFR has been calculated using the CKD EPI equation. This calculation has not been validated in all clinical situations. eGFR's persistently <90 mL/min signify possible Chronic Kidney Disease.   . Anion gap 04/20/2014 9  5 - 15 Final  . WBC 04/20/2014 6.7  4.0 - 10.5 K/uL Final  . RBC 04/20/2014 3.52* 4.22 - 5.81 MIL/uL Final  . Hemoglobin 04/20/2014 10.9* 13.0 - 17.0 g/dL Final  . HCT 04/20/2014 33.1* 39.0 - 52.0 % Final  . MCV 04/20/2014 94.0  78.0 - 100.0 fL Final  . MCH 04/20/2014 31.0  26.0 - 34.0 pg Final  . MCHC 04/20/2014 32.9  30.0 - 36.0 g/dL Final  . RDW 04/20/2014 13.6  11.5 - 15.5 % Final  . Platelets 04/20/2014 131* 150 - 400 K/uL Final  . Sodium 04/21/2014 138  135 - 145 mmol/L Final  . Potassium 04/21/2014 4.0  3.5 - 5.1 mmol/L Final  . Chloride 04/21/2014 109  96 - 112 mmol/L Final  . CO2 04/21/2014 21  19 - 32 mmol/L Final  . Glucose, Bld 04/21/2014 118* 70 - 99 mg/dL Final  . BUN 04/21/2014 29* 6 - 23 mg/dL Final  . Creatinine, Ser 04/21/2014 2.32* 0.50 - 1.35 mg/dL Final  . Calcium 04/21/2014 8.7  8.4 - 10.5 mg/dL Final  . GFR calc non Af Amer 04/21/2014 22* >90 mL/min Final  . GFR calc Af Amer 04/21/2014 26* >90 mL/min Final   Comment: (NOTE) The eGFR has been calculated using the CKD EPI equation. This calculation has not been validated in all clinical situations. eGFR's persistently <90 mL/min signify possible Chronic Kidney Disease.   . Anion  gap 04/21/2014 8  5 - 15 Final  . TSH 04/21/2014 1.217  0.350 - 4.500 uIU/mL Final  Abstract on 04/10/2014  Component Date Value Ref Range Status  . Hemoglobin 04/10/2014 10.6* 13.5 - 17.5 g/dL Final  . HCT 04/10/2014 32* 41 - 53 % Final  . Platelets 04/10/2014 150  150 - 399 K/L Final  . WBC 04/10/2014 6.5   Final  .  Glucose 04/10/2014 127   Final  . BUN 04/10/2014 31* 4 - 21 mg/dL Final  . Creatinine 04/10/2014 2.4* 0.6 - 1.3 mg/dL Final  . Potassium 04/10/2014 4.4  3.4 - 5.3 mmol/L Final  . Sodium 04/10/2014 137  137 - 147 mmol/L Final  . Alkaline Phosphatase 04/10/2014 106  25 - 125 U/L Final  . ALT 04/10/2014 10  10 - 40 U/L Final  . AST 04/10/2014 12* 14 - 40 U/L Final  . Bilirubin, Total 04/10/2014 1.1   Final  . TSH 04/10/2014 1.48  0.41 - 5.90 uIU/mL Final  Nursing Home on 04/04/2014  Component Date Value Ref Range Status  . Glucose 03/27/2014 125   Final  . BUN 03/27/2014 30* 4 - 21 mg/dL Final  . Creatinine 03/27/2014 2.4* 0.6 - 1.3 mg/dL Final  . Potassium 03/27/2014 4.7  3.4 - 5.3 mmol/L Final  . Sodium 03/27/2014 138  137 - 147 mmol/L Final  . LDl/HDL Ratio 03/27/2014 3.4   Final  . Triglycerides 03/27/2014 97  40 - 160 mg/dL Final  . Cholesterol 03/27/2014 131  0 - 200 mg/dL Final  . HDL 03/27/2014 39  35 - 70 mg/dL Final  . LDL Cholesterol 03/27/2014 73   Final  . Alkaline Phosphatase 03/27/2014 103  25 - 125 U/L Final  . ALT 03/27/2014 10  10 - 40 U/L Final  . AST 03/27/2014 18  14 - 40 U/L Final  . Bilirubin, Total 03/27/2014 0.7   Final  . Hgb A1c MFr Bld 03/27/2014 6.3* 4.0 - 6.0 % Final    Dg Lumbar Spine Complete  04/19/2014   CLINICAL DATA:  81 year old male with unwitnessed fall. Bruising. Initial encounter.  EXAM: LUMBAR SPINE - COMPLETE 4+ VIEW  COMPARISON:  None.  FINDINGS: Normal lumbar segmentation. Partially visible cardiac pacemaker lead. Suture material in the mid abdomen. Severe or complete disc space loss at L4-L5, there may be interbody  ankylosis. There is moderate to severe facet hypertrophy from L3 to the sacrum. Bulky endplate osteophytes elsewhere in the lumbar spine with relatively preserved disc spaces. No compression fracture. Both SI joints may be ankylosed. Aortoiliac calcified atherosclerosis noted. Grossly intact visible lower thoracic levels.  IMPRESSION: 1.  No acute fracture or listhesis identified in the lumbar spine. 2. 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   Electronically Signed   By: Genevie Ann M.D.   On: 04/19/2014 10:24   Dg Pelvis 1-2 Views  04/19/2014   CLINICAL DATA:  Unwitnessed fall, history dementia, bruising to face and neck  EXAM: PELVIS - 1-2 VIEW  COMPARISON:  None  FINDINGS: Osseous demineralization.  Mild narrowing of the hip joints bilaterally.  SI joints poorly visualized due to degree of demineralization.  No acute fracture, dislocation or bone destruction.  Degenerative disc disease changes at visualized lower lumbar spine.  Scattered atherosclerotic calcifications.  IMPRESSION: Osseous demineralization.  No acute abnormalities.   Electronically Signed   By: Lavonia Dana M.D.   On: 04/19/2014 10:25   Ct Head Wo Contrast  04/19/2014   CLINICAL DATA:  Fall, dementia  EXAM: CT HEAD WITHOUT CONTRAST  CT CERVICAL SPINE WITHOUT CONTRAST  TECHNIQUE: Multidetector CT imaging of the head and cervical spine was performed following the standard protocol without intravenous contrast. Multiplanar CT image reconstructions of the cervical spine were also generated.  COMPARISON:  09/01/2012  FINDINGS: CT HEAD FINDINGS  No skull fracture is noted. Again noted central heterogeneous opacification left maxillary sinus.  Thickening of the lateral wall of left maxillary sinus is stable. Stable cystic changes in left sphenoid bone.  There is new subdural collection along left hemisphere which is isodense measures about 9 mm thickness. Only small amount of anterior increased attenuation axial image 20 probable  subacute blood products. No significant mass effect or midline shift. Stable cerebral atrophy. Stable periventricular and patchy subcortical chronic white matter disease. No definite acute cortical infarction. No mass lesion is noted.  CT CERVICAL SPINE FINDINGS  Axial images of the cervical spine shows no acute fracture or or subluxation. Atherosclerotic calcification bilateral carotid bifurcation. Computer processed images shows no acute fracture or subluxation. There is significant disc space flattening at C4-C5 level. Mild disc space flattening at C2-C3 and C5-C6 level. Mild disc space flattening at C6-C7 and C7-T1 level. Large anterior bridging osteophytes at C2-C3 C3-C4 and C4-C5 level. Degenerative changes C1-C2 articulation. Multilevel facet degenerative changes. There is no pneumothorax in visualized lung apices.  IMPRESSION: 1. There is new isodense subdural left hemispheric collection which measures 9 mm maximum thickness. Only small amount of subacute blood products anteriorly see axial image 20. No mass effect or midline shift. Stable atrophy and chronic white matter disease. No definite acute cortical infarction. No mass lesion is noted on this unenhanced scan 2. No cervical spine acute fracture or subluxation. Degenerative changes as described above. These results were called by telephone at the time of interpretation on 04/19/2014 at 10:15 am to Dr. Milton Ferguson , who verbally acknowledged these results.   Electronically Signed   By: Lahoma Crocker M.D.   On: 04/19/2014 10:15   Ct Cervical Spine Wo Contrast  04/19/2014   CLINICAL DATA:  Fall, dementia  EXAM: CT HEAD WITHOUT CONTRAST  CT CERVICAL SPINE WITHOUT CONTRAST  TECHNIQUE: Multidetector CT imaging of the head and cervical spine was performed following the standard protocol without intravenous contrast. Multiplanar CT image reconstructions of the cervical spine were also generated.  COMPARISON:  09/01/2012  FINDINGS: CT HEAD FINDINGS  No skull  fracture is noted. Again noted central heterogeneous opacification left maxillary sinus. Thickening of the lateral wall of left maxillary sinus is stable. Stable cystic changes in left sphenoid bone.  There is new subdural collection along left hemisphere which is isodense measures about 9 mm thickness. Only small amount of anterior increased attenuation axial image 20 probable subacute blood products. No significant mass effect or midline shift. Stable cerebral atrophy. Stable periventricular and patchy subcortical chronic white matter disease. No definite acute cortical infarction. No mass lesion is noted.  CT CERVICAL SPINE FINDINGS  Axial images of the cervical spine shows no acute fracture or or subluxation. Atherosclerotic calcification bilateral carotid bifurcation. Computer processed images shows no acute fracture or subluxation. There is significant disc space flattening at C4-C5 level. Mild disc space flattening at C2-C3 and C5-C6 level. Mild disc space flattening at C6-C7 and C7-T1 level. Large anterior bridging osteophytes at C2-C3 C3-C4 and C4-C5 level. Degenerative changes C1-C2 articulation. Multilevel facet degenerative changes. There is no pneumothorax in visualized lung apices.  IMPRESSION: 1. There is new isodense subdural left hemispheric collection which measures 9 mm maximum thickness. Only small amount of subacute blood products anteriorly see axial image 20. No mass effect or midline shift. Stable atrophy and chronic white matter disease. No definite acute cortical infarction. No mass lesion is noted on this unenhanced scan 2. No cervical spine acute fracture or subluxation. Degenerative changes as described above. These results were called by telephone at the  time of interpretation on 04/19/2014 at 10:15 am to Dr. Milton Ferguson , who verbally acknowledged these results.   Electronically Signed   By: Lahoma Crocker M.D.   On: 04/19/2014 10:15     Assessment/Plan  1. Subdural  hematoma Continued observation and the skilled nursing facility. Patient is enrolled in physical therapy to help strengthen him, trained in a more stable gait for safe mobility, and hopefully increase self-care skills.  2. Dementia, without behavioral disturbance Slowly progressive. Most likely due to Alzheimer's disease.  3. Diabetes mellitus due to underlying condition with diabetic neuropathy Reasonably well controlled.  4. Essential hypertension Mildly elevated today but multiple other blood pressures have been noted as normal since his admission to the skilled nursing facility.  5. Dysphagia, pharyngoesophageal phase History of some choking episodes when eating, but he is doing well at the present time.  6. Gait instability Continues to be a high risk for falls. Uses walker.  7. Hyperlipemia Stable  8. CKD (chronic kidney disease) stage 4, GFR 15-29 ml/min Gradually worsening.  9. Ecchymosis Resolving  10. Hearing loss, bilateral Severe enough to interfere with communication.  As noted above, patient should probably be admitted to assisted living following his rehabilitative stay in the skilled nursing facility area. I'm not sure the patient or his son can be convinced to allow him to make this move.

## 2014-04-28 ENCOUNTER — Other Ambulatory Visit: Payer: Self-pay | Admitting: Nurse Practitioner

## 2014-04-28 DIAGNOSIS — E0840 Diabetes mellitus due to underlying condition with diabetic neuropathy, unspecified: Secondary | ICD-10-CM

## 2014-04-28 DIAGNOSIS — N184 Chronic kidney disease, stage 4 (severe): Secondary | ICD-10-CM

## 2014-05-01 ENCOUNTER — Encounter: Payer: Self-pay | Admitting: Internal Medicine

## 2014-05-02 ENCOUNTER — Encounter: Payer: Self-pay | Admitting: Nurse Practitioner

## 2014-05-02 ENCOUNTER — Non-Acute Institutional Stay (SKILLED_NURSING_FACILITY): Payer: Medicare Other | Admitting: Nurse Practitioner

## 2014-05-02 DIAGNOSIS — F329 Major depressive disorder, single episode, unspecified: Secondary | ICD-10-CM

## 2014-05-02 DIAGNOSIS — I6200 Nontraumatic subdural hemorrhage, unspecified: Secondary | ICD-10-CM

## 2014-05-02 DIAGNOSIS — Z8719 Personal history of other diseases of the digestive system: Secondary | ICD-10-CM | POA: Diagnosis not present

## 2014-05-02 DIAGNOSIS — F0390 Unspecified dementia without behavioral disturbance: Secondary | ICD-10-CM

## 2014-05-02 DIAGNOSIS — N390 Urinary tract infection, site not specified: Secondary | ICD-10-CM

## 2014-05-02 DIAGNOSIS — I10 Essential (primary) hypertension: Secondary | ICD-10-CM

## 2014-05-02 DIAGNOSIS — S065XAA Traumatic subdural hemorrhage with loss of consciousness status unknown, initial encounter: Secondary | ICD-10-CM

## 2014-05-02 DIAGNOSIS — I482 Chronic atrial fibrillation, unspecified: Secondary | ICD-10-CM

## 2014-05-02 DIAGNOSIS — N184 Chronic kidney disease, stage 4 (severe): Secondary | ICD-10-CM

## 2014-05-02 DIAGNOSIS — F32A Depression, unspecified: Secondary | ICD-10-CM

## 2014-05-02 DIAGNOSIS — E0840 Diabetes mellitus due to underlying condition with diabetic neuropathy, unspecified: Secondary | ICD-10-CM

## 2014-05-02 DIAGNOSIS — S065X9A Traumatic subdural hemorrhage with loss of consciousness of unspecified duration, initial encounter: Secondary | ICD-10-CM

## 2014-05-02 DIAGNOSIS — Z8711 Personal history of peptic ulcer disease: Secondary | ICD-10-CM

## 2014-05-02 NOTE — Assessment & Plan Note (Signed)
Forgetful and requires close supervision for safety and ADLs. Takes Galantamine 8mg. Obtain MMSE    

## 2014-05-02 NOTE — Assessment & Plan Note (Signed)
04/30/14 urine culture lactobacillus-7 day course of Augmentin 876mg  bid 05/02/14 in setting of persisted confusion

## 2014-05-02 NOTE — Progress Notes (Signed)
Patient ID: Patrick Murray, male   DOB: 05-03-1917, 81 y.o.   MRN: [id number]   Code Status: DNR  Allergies  Allergen Reactions  . Aricept [Donepezil Hcl] Other (See Comments)    Unknown   . Simvastatin Other (See Comments)    Unknown     Chief Complaint  Patient presents with  . Medical Management of Chronic Issues  . Acute Visit    UTI    HPI: Patient is a 81 y.o. male seen in the SNF at [facility] today for evaluation of f/u hospitalization for UTI and chronic medical conditions.      Hospitalized from 04/19/2014--04/22/2014 for evaluation of s/p fall, subdural hematoma, and CKD   -pt found on floor by caregiver -04/19/2014 echocardiogram shows Inf Wall HK which appears to be new from his previous echo -consulted cardiology to also assist in clarifying if he is having long runs or tachycardia -placed on tele--not concerning dysrhythmias -cardiology interrogated PPM--> no concerning dysrhythmia -hold ASA for now -The patient was orthostatic which possibly contributed to his fall  04/19/2014. CT of the brain in the emergency department revealed a small subdural hematoma. Neurosurgery was consulted and did not feel that the patient needed any interventions at this time. There was no mass effect or effacement. Since admission, the patient has been more alert but remains a little more confused than baseline according to his caregiver. Fortunately, the patient's mental status continued to improve throughout the hospitalization. At the time of discharge, the patient was near his baseline mental status.       Problem List Items Addressed This Visit    Essential hypertension (Chronic)    Avoid ACEIs due to CKD. -Given the patient's orthostatics at age, we'll allow for permissive hypertension and avoid aggressive treatment       Dementia (Chronic)    Forgetful and requires close supervision for safety and ADLs. Takes Galantamine . Obtain MMSE       Diabetes  mellitus with neurological manifestation (Chronic)    -Allow for liberal glycemic control -03/27/2014 hemoglobin A1c 6.3  -04/27/14 blood sugar 107  -       Subdural hematoma (Chronic)    Resulted from fall in his apartment. No new focal neurological symptoms. Risk for fall       CKD (chronic kidney disease) stage 4, GFR 15-29 ml/min (Chronic)    04/27/14 Bun/creat 25/2.14      History of gastric ulcer    Takes Protonix  daily. Asymptomatic presently.        Chronic atrial fibrillation    Heart rate is in control. Takes ASA  daily.        Depression    Mood is stable. Takes Sertraline  daily.        UTI (urinary tract infection) - Primary    04/30/14 urine culture lactobacillus-7 day course of Augmentin  bid 05/02/14 in setting of persisted confusion          Review of Systems:  Review of Systems  Constitutional: Positive for fatigue. Negative for fever, chills, diaphoresis, activity change, appetite change and unexpected weight change.  HENT: Positive for hearing loss. Negative for congestion, ear pain, facial swelling and nosebleeds.   Eyes: Negative.        Corrective lenses.  Respiratory: Negative for chest tightness and shortness of breath. Cough: dry.   Cardiovascular: Negative for chest pain, palpitations and leg swelling.  Gastrointestinal: Negative for abdominal pain and abdominal distention.  Cough with eating and drinking.  Endocrine: Negative for cold intolerance, heat intolerance, polydipsia, polyphagia and polyuria.       Diabetic.  Genitourinary: Negative.   Musculoskeletal: Positive for myalgias, arthralgias and gait problem. Negative for neck pain and neck stiffness.  Skin:       Inflamed right great toe and an area medially of ingrown nail that has been filed..  Neurological: Positive for dizziness and weakness. Negative for tremors, seizures, syncope, numbness and headaches.       Balance disturbance. Dementia.    Hematological: Negative.   Psychiatric/Behavioral: Positive for behavioral problems and confusion.     Past Medical History  Diagnosis Date  . Cardiac pacemaker in situ     a.fib w/slow ventricular response  . Atrial fibrillation   . Myalgia and myositis, unspecified   . Unspecified essential hypertension   . Other and unspecified hyperlipidemia   . Sinoatrial node dysfunction   . Acute posthemorrhagic anemia   . Depression   . Gastric ulcer   . Dementia   . Vitamin D deficiency   . Gait disorder   . Chronic kidney disease, stage II (mild)   . Acute gastric ulcer with hemorrhage, without mention of obstruction   . Other malaise and fatigue   . Edema   . Personal history of fall   . Hemorrhage of gastrointestinal tract, unspecified 10/13/11  . Muscle weakness (generalized)   . Anemia, unspecified   . Other specified disease of sebaceous glands     xerosis cutis  . Unspecified pruritic disorder   . Unspecified urinary incontinence 01/01/10  . Unspecified hearing loss   . Peripheral vascular disease, unspecified   . Syncope and collapse 08/08/2008  . Abrasion of face   . Cough   . Type II or unspecified type diabetes mellitus with renal manifestations, not stated as uncontrolled   . Subdural hematoma 04/19/2014    04/19/14 CT head and cervical spine: MPRESSION: 1. There is new isodense subdural left hemispheric collection which measures 9 mm maximum thickness. Only small amount of subacute blood products anteriorly see axial image 20. No mass effect or midline shift. Stable atrophy and chronic white matter disease. No definite acute cortical infarction. No mass lesion is noted on this unenhanced scan 2. No cervical spine acute fracture or subluxation. Degenerative changes as described above.   . Fall 10/07/2011  . Ecchymosis 04/04/2014  . CKD (chronic kidney disease) stage 4, GFR 15-29 ml/min 04/20/2014  . Chronic atrial fibrillation    Past Surgical History  Procedure Laterality  Date  . No past surgeries    . Esophagogastroduodenoscopy  10/08/2011    Procedure: ESOPHAGOGASTRODUODENOSCOPY (EGD);  Surgeon: Willis ModenaWilliam Outlaw, MD;  Location: [facility];  Service: Endoscopy;  Laterality: N/A;  . Pacemaker generator change  08/05/07    [place]  . Pacemaker insertion  2003  . Appendectomy  1926  . Cholecystectomy  1956  . Tonsillectomy  1926   Social History:   reports that he has quit smoking. His smoking use included Pipe. He has never used smokeless tobacco. He reports that he does not drink alcohol or use illicit drugs.  Family History  Problem Relation Age of Onset  . Heart attack Mother     had a couple  . Heart failure Mother   . Emphysema Father   . Cancer Sister     lung    Medications: Patient's Medications  New Prescriptions   No medications on file  Previous Medications  ASPIRIN 81 MG TABLET    Take 1 tablet (81 mg total) by mouth daily. Start on 05/06/14   CHOLECALCIFEROL (VITAMIN D) 2000 UNITS TABLET    Take 1,000 Units by mouth daily.    DEXTROMETHORPHAN (DELSYM) 30 MG/5ML LIQUID    Take 10 mLs (60 mg total) by mouth as needed for cough.   ENSURE (ENSURE)    Take 237 mLs by mouth. One can daily   GALANTAMINE (RAZADYNE) 8 MG TABLET    TAKE 1 TABLET TWICE DAILY.   GLUCOSAMINE-CHONDROITIN (GLUCOSAMINE CHONDR COMPLEX PO)    Take 1 capsule by mouth daily.   PANTOPRAZOLE (PROTONIX) 40 MG TABLET    TAKE 1 TABLET DAILY TO REDUCE STOMACH ACID.   PYRITHIONE ZINC (SELSUN BLUE DRY SCALP) 1 % SHAMPOO    Shampoo at least 3 times weekly   SALINE NASAL SPRAY NA    Place into the nose. One spray in each nostril as needed   SERTRALINE (ZOLOFT) 50 MG TABLET    TAKE ONE TABLET DAILY TO HELP WITH NERVES.  Modified Medications   No medications on file  Discontinued Medications   No medications on file     Physical Exam: Physical Exam  Constitutional:  Frail elderly male  HENT:  Bilateral hearing aids. Loss of hearing.l. No bleeding or scabs. Tympanic  membranes have some scarring bilaterally but no acute injury.  Eyes: Conjunctivae are normal. Pupils are equal, round, and reactive to light.  Neck: Normal range of motion. Neck supple. No JVD present. No tracheal deviation present. No thyromegaly present.  Deviated spine of the nose.  Cardiovascular: Normal rate and intact distal pulses.  Exam reveals no gallop and no friction rub.   No murmur heard. Atrial fib Diminished DP and PT bilaterally  Pulmonary/Chest: Effort normal. No respiratory distress. He has no wheezes. He has no rales.  Abdominal: Soft. Bowel sounds are normal. He exhibits no distension and no mass. There is no tenderness.  Musculoskeletal: Normal range of motion. He exhibits no edema or tenderness.  Poor balance.  Lymphadenopathy:    He has no cervical adenopathy.  Neurological: He is alert. No cranial nerve deficit. Coordination abnormal.  11/22/13 MMSE 17/30. Failed clock drawing. Diminished sensation to vibration and to monofilament testing bilaterally.  Skin: Skin is warm and dry. No rash noted. No erythema. No pallor.  Healed ulcer of the left foot at the proximal 5th metatarsal head. Residual scaly area.   Psychiatric: He has a normal mood and affect.  Cheerful. Forgetful.   Filed Vitals:   05/02/14 1039  BP: 170/80  Pulse: 78  Temp: 97.3 F (36.3 C)  TempSrc: Tympanic  Resp: 28      Labs reviewed: Basic Metabolic Panel:  Recent Labs  16/10/96  04/19/14 0917 04/20/14 0515 04/21/14 0541 04/27/14  NA 137  < > 138 136 138 141  K 4.4  --  4.5 4.3 4.0 4.1  CL  --   --  106 107 109  --   CO2  --   --  23 20 21   --   GLUCOSE  --   --  133* 130* 118*  --   BUN 31*  < > 29* 28* 29* 25*  CREATININE 2.4*  < > 2.38* 2.28* 2.32* 2.1*  CALCIUM  --   --  8.8 8.8 8.7  --   TSH 1.48  --   --   --  1.217  --   < > = values in this interval  not displayed. Liver Function Tests:  Recent Labs  03/27/14 04/10/14 04/19/14 0917  AST 18 12* 17  ALT ALKPHOS 103 106 106  BILITOT  --   --  1.4*  PROT  --   --  6.9  ALBUMIN  --   --  3.8   No results for input(s): LIPASE, AMYLASE in the last 8760 hours. No results for input(s): AMMONIA in the last 8760 hours. CBC:  Recent Labs  04/10/14 04/19/14 0917 04/20/14 0515  WBC 6.5 6.7 6.7  NEUTROABS  --  5.3  --   HGB 10.6* 11.6* 10.9*  HCT 32* 35.6* 33.1*  MCV  --  94.7 94.0  PLT 150 140* 131*   Lipid Panel:  Recent Labs  11/14/13 03/27/14  CHOL 162 131  HDL 39 39  LDLCALC 97 73  TRIG 128 97    Past Procedures:  04/19/14 CT head and cervical spine wo contrast:  MPRESSION: 1. There is new isodense subdural left hemispheric collection which measures 9 mm maximum thickness. Only small amount of subacute blood products anteriorly see axial image 20. No mass effect or midline shift. Stable atrophy and chronic white matter disease. No definite acute cortical infarction. No mass lesion is noted on this unenhanced scan 2. No cervical spine acute fracture or subluxation. Degenerative changes as described above.   Assessment/Plan UTI (urinary tract infection) 04/30/14 urine culture lactobacillus-7 day course of Augmentin  bid 05/02/14 in setting of persisted confusion    Depression Mood is stable. Takes Sertraline  daily.     Chronic atrial fibrillation Heart rate is in control. Takes ASA  daily.     History of gastric ulcer Takes Protonix  daily. Asymptomatic presently.     CKD (chronic kidney disease) stage 4, GFR 15-29 ml/min 04/27/14 Bun/creat 25/2.14   Subdural hematoma Resulted from fall in his apartment. No new focal neurological symptoms. Risk for fall    Diabetes mellitus with neurological manifestation -Allow for liberal glycemic control -03/27/2014 hemoglobin A1c 6.3  -04/27/14 blood sugar 107  -    Dementia Forgetful and requires close supervision for safety and ADLs. Takes Galantamine . Obtain MMSE    Essential  hypertension Avoid ACEIs due to CKD. -Given the patient's orthostatics at age, we'll allow for permissive hypertension and avoid aggressive treatment      Family/ Staff Communication: observe the patient.   Goals of Care: [place] vs [place]  Labs/tests ordered: urine culture 04/30/14

## 2014-05-02 NOTE — Assessment & Plan Note (Signed)
Resulted from fall in his apartment. No new focal neurological symptoms. Risk for fall 

## 2014-05-02 NOTE — Assessment & Plan Note (Signed)
Avoid ACEIs due to CKD. -Given the patient's orthostatics at age, we'll allow for permissive hypertension and avoid aggressive treatment 

## 2014-05-02 NOTE — Assessment & Plan Note (Signed)
Heart rate is in control. Takes ASA 81mg daily.   

## 2014-05-02 NOTE — Assessment & Plan Note (Signed)
Takes Protonix 40mg daily. Asymptomatic presently.   

## 2014-05-02 NOTE — Assessment & Plan Note (Signed)
-  Allow for liberal glycemic control -03/27/2014 hemoglobin A1c 6.3  -04/27/14 blood sugar 107  -

## 2014-05-02 NOTE — Assessment & Plan Note (Signed)
04/27/14 Bun/creat 25/2.14   

## 2014-05-02 NOTE — Assessment & Plan Note (Signed)
Mood is stable. Takes Sertraline 50mg daily.  

## 2014-05-05 ENCOUNTER — Non-Acute Institutional Stay (SKILLED_NURSING_FACILITY): Payer: Medicare Other | Admitting: Nurse Practitioner

## 2014-05-05 ENCOUNTER — Encounter: Payer: Self-pay | Admitting: Nurse Practitioner

## 2014-05-05 DIAGNOSIS — E0840 Diabetes mellitus due to underlying condition with diabetic neuropathy, unspecified: Secondary | ICD-10-CM

## 2014-05-05 DIAGNOSIS — I6200 Nontraumatic subdural hemorrhage, unspecified: Secondary | ICD-10-CM | POA: Diagnosis not present

## 2014-05-05 DIAGNOSIS — R2681 Unsteadiness on feet: Secondary | ICD-10-CM | POA: Diagnosis not present

## 2014-05-05 DIAGNOSIS — I10 Essential (primary) hypertension: Secondary | ICD-10-CM

## 2014-05-05 DIAGNOSIS — S065X9A Traumatic subdural hemorrhage with loss of consciousness of unspecified duration, initial encounter: Secondary | ICD-10-CM

## 2014-05-05 DIAGNOSIS — N184 Chronic kidney disease, stage 4 (severe): Secondary | ICD-10-CM | POA: Diagnosis not present

## 2014-05-05 DIAGNOSIS — Z8719 Personal history of other diseases of the digestive system: Secondary | ICD-10-CM | POA: Diagnosis not present

## 2014-05-05 DIAGNOSIS — F0390 Unspecified dementia without behavioral disturbance: Secondary | ICD-10-CM

## 2014-05-05 DIAGNOSIS — L299 Pruritus, unspecified: Secondary | ICD-10-CM | POA: Diagnosis not present

## 2014-05-05 DIAGNOSIS — S065XAA Traumatic subdural hemorrhage with loss of consciousness status unknown, initial encounter: Secondary | ICD-10-CM

## 2014-05-05 DIAGNOSIS — Z8711 Personal history of peptic ulcer disease: Secondary | ICD-10-CM

## 2014-05-05 NOTE — Progress Notes (Signed)
Patient ID: Patrick Murray, male   DOB: 05/08/1917, 81 y.o.   MRN: [id number]   Code Status: DNR  Allergies  Allergen Reactions  . Aricept [Donepezil Hcl] Other (See Comments)    Unknown   . Simvastatin Other (See Comments)    Unknown     Chief Complaint  Patient presents with  . Medical Management of Chronic Issues  . Acute Visit    itching     HPI: Patient is a 81 y.o. male [facility] for evaluation of itching and chronic medical conditions.      Hospitalized from 04/19/2014--04/22/2014 for evaluation of s/p fall, subdural hematoma, and CKD   -pt found on floor by caregiver -04/19/2014 echocardiogram shows Inf Wall HK which appears to be new from his previous echo -consulted cardiology to also assist in clarifying if he is having long runs or tachycardia -placed on tele--not concerning dysrhythmias -cardiology interrogated PPM--> no concerning dysrhythmia -hold ASA for now -The patient was orthostatic which possibly contributed to his fall  04/19/2014. CT of the brain in the emergency department revealed a small subdural hematoma. Neurosurgery was consulted and did not feel that the patient needed any interventions at this time. There was no mass effect or effacement. Since admission, the patient has been more alert but remains a little more confused than baseline according to his caregiver. Fortunately, the patient's mental status continued to improve throughout the hospitalization. At the time of discharge, the patient was near his baseline mental status.       Problem List Items Addressed This Visit    Essential hypertension - Primary (Chronic)    Avoid ACEIs due to CKD. -Given the patient's orthostatics at age, we'll allow for permissive hypertension and avoid aggressive treatment      Gait instability (Chronic)    Ambulates with walker with SBA for short distance and w/c to go further.       Dementia (Chronic)    Forgetful and  requires close supervision for safety and ADLs. Takes Galantamine 8mg . Obtain MMSE        Diabetes mellitus with neurological manifestation (Chronic)    Allow for liberal glycemic control -03/27/2014 hemoglobin A1c 6.3  -04/27/14 blood sugar 107        Pruritus (Chronic)    Chronic. Triamcinolone cram daily to scratched areas and Zyrtec 5mg  po daily.       Subdural hematoma (Chronic)    Resulted from fall in his apartment. No new focal neurological symptoms. Risk for fall. Contributory to continued cognitive decline.         CKD (chronic kidney disease) stage 4, GFR 15-29 ml/min (Chronic)    04/27/14 Bun/creat 25/2.14       History of gastric ulcer    Takes Protonix 40mg  daily. Asymptomatic presently.           Review of Systems:  Review of Systems  Constitutional: Negative for fever, chills, diaphoresis, activity change, appetite change, fatigue and unexpected weight change.  HENT: Positive for hearing loss. Negative for congestion, ear pain, facial swelling and nosebleeds.   Eyes: Negative.        Corrective lenses.  Respiratory: Negative for chest tightness and shortness of breath. Cough: dry.   Cardiovascular: Negative for chest pain, palpitations and leg swelling.  Gastrointestinal: Negative for abdominal pain and abdominal distention.       Cough with eating and drinking.  Endocrine: Negative for cold intolerance, heat intolerance, polydipsia, polyphagia and  polyuria.       Diabetic.  Genitourinary: Negative.   Musculoskeletal: Positive for myalgias, arthralgias and gait problem. Negative for neck pain and neck stiffness.  Skin:       Itching skin with scratched injures arms, legs, and nape.   Neurological: Positive for dizziness and weakness. Negative for tremors, seizures, syncope, numbness and headaches.       Balance disturbance. Dementia.  Hematological: Negative.   Psychiatric/Behavioral: Positive for behavioral problems and confusion.     Past  Medical History  Diagnosis Date  . Cardiac pacemaker in situ     a.fib w/slow ventricular response  . Atrial fibrillation   . Myalgia and myositis, unspecified   . Unspecified essential hypertension   . Other and unspecified hyperlipidemia   . Sinoatrial node dysfunction   . Acute posthemorrhagic anemia   . Depression   . Gastric ulcer   . Dementia   . Vitamin D deficiency   . Gait disorder   . Chronic kidney disease, stage II (mild)   . Acute gastric ulcer with hemorrhage, without mention of obstruction   . Other malaise and fatigue   . Edema   . Personal history of fall   . Hemorrhage of gastrointestinal tract, unspecified 10/13/11  . Muscle weakness (generalized)   . Anemia, unspecified   . Other specified disease of sebaceous glands     xerosis cutis  . Unspecified pruritic disorder   . Unspecified urinary incontinence 01/01/10  . Unspecified hearing loss   . Peripheral vascular disease, unspecified   . Syncope and collapse 08/08/2008  . Abrasion of face   . Cough   . Type II or unspecified type diabetes mellitus with renal manifestations, not stated as uncontrolled   . Subdural hematoma 04/19/2014    04/19/14 CT head and cervical spine: MPRESSION: 1. There is new isodense subdural left hemispheric collection which measures 9 mm maximum thickness. Only small amount of subacute blood products anteriorly see axial image 20. No mass effect or midline shift. Stable atrophy and chronic white matter disease. No definite acute cortical infarction. No mass lesion is noted on this unenhanced scan 2. No cervical spine acute fracture or subluxation. Degenerative changes as described above.   . Fall 10/07/2011  . Ecchymosis 04/04/2014  . CKD (chronic kidney disease) stage 4, GFR 15-29 ml/min 04/20/2014  . Chronic atrial fibrillation    Past Surgical History  Procedure Laterality Date  . No past surgeries    . Esophagogastroduodenoscopy  10/08/2011    Procedure: ESOPHAGOGASTRODUODENOSCOPY  (EGD);  Surgeon: Willis Modena, MD;  Location: [facility];  Service: Endoscopy;  Laterality: N/A;  . Pacemaker generator change  08/05/07    [facility]  . Pacemaker insertion  2003  . Appendectomy  1926  . Cholecystectomy  1956  . Tonsillectomy  1926   Social History:   reports that he has quit smoking. His smoking use included Pipe. He has never used smokeless tobacco. He reports that he does not drink alcohol or use illicit drugs.  Family History  Problem Relation Age of Onset  . Heart attack Mother     had a couple  . Heart failure Mother   . Emphysema Father   . Cancer Sister     lung    Medications: Patient's Medications  New Prescriptions   No medications on file  Previous Medications   ASPIRIN 81 MG TABLET    Take 1 tablet (81 mg total) by mouth daily. Start on 05/06/14   CHOLECALCIFEROL (  VITAMIN D) 2000 UNITS TABLET    Take 1,000 Units by mouth daily.    DEXTROMETHORPHAN (DELSYM) 30 MG/5ML LIQUID    Take 10 mLs (60 mg total) by mouth as needed for cough.   ENSURE (ENSURE)    Take 237 mLs by mouth. One can daily   GALANTAMINE (RAZADYNE) 8 MG TABLET    TAKE 1 TABLET TWICE DAILY.   GLUCOSAMINE-CHONDROITIN (GLUCOSAMINE CHONDR COMPLEX PO)    Take 1 capsule by mouth daily.   PANTOPRAZOLE (PROTONIX) 40 MG TABLET    TAKE 1 TABLET DAILY TO REDUCE STOMACH ACID.   PYRITHIONE ZINC (SELSUN BLUE DRY SCALP) 1 % SHAMPOO    Shampoo at least 3 times weekly   SALINE NASAL SPRAY NA    Place into the nose. One spray in each nostril as needed   SERTRALINE (ZOLOFT) 50 MG TABLET    TAKE ONE TABLET DAILY TO HELP WITH NERVES.  Modified Medications   No medications on file  Discontinued Medications   No medications on file     Physical Exam: Physical Exam  Constitutional:  Frail elderly male  HENT:  Bilateral hearing aids. Loss of hearing.l. No bleeding or scabs. Tympanic membranes have some scarring bilaterally but no acute injury.  Eyes: Conjunctivae are normal. Pupils are equal, round,  and reactive to light.  Neck: Normal range of motion. Neck supple. No JVD present. No tracheal deviation present. No thyromegaly present.  Deviated spine of the nose.  Cardiovascular: Normal rate and intact distal pulses.  Exam reveals no gallop and no friction rub.   No murmur heard. Atrial fib Diminished DP and PT bilaterally  Pulmonary/Chest: Effort normal. No respiratory distress. He has no wheezes. He has no rales.  Abdominal: Soft. Bowel sounds are normal. He exhibits no distension and no mass. There is no tenderness.  Musculoskeletal: Normal range of motion. He exhibits no edema or tenderness.  Poor balance.  Lymphadenopathy:    He has no cervical adenopathy.  Neurological: He is alert. No cranial nerve deficit. Coordination abnormal.  11/22/13 MMSE 17/30. Failed clock drawing. Diminished sensation to vibration and to monofilament testing bilaterally.  Skin: Skin is warm and dry. No rash noted. No erythema. No pallor.  Healed ulcer of the left foot at the proximal 5th metatarsal head. Residual scaly area. Scratched injuries arms and legs and nape. chronic   Psychiatric: He has a normal mood and affect.  Cheerful. Forgetful.   Filed Vitals:   05/05/14 1223  BP: 131/89  Pulse: 73  Temp: 98.1 F (36.7 C)  TempSrc: Tympanic  Resp: 20      Labs reviewed: Basic Metabolic Panel:  Recent Labs  16/10/96  04/19/14 0917 04/20/14 0515 04/21/14 0541 04/27/14  NA 137  < > 138 136 138 141  K 4.4  --  4.5 4.3 4.0 4.1  CL  --   --  106 107 109  --   CO2  --   --  --   GLUCOSE  --   --  133* 130* 118*  --   BUN 31*  < > 29* 28* 29* 25*  CREATININE 2.4*  < > 2.38* 2.28* 2.32* 2.1*  CALCIUM  --   --  8.8 8.8 8.7  --   TSH 1.48  --   --   --  1.217  --   < > = values in this interval not displayed. Liver Function Tests:  Recent Labs  03/27/14 04/10/14 04/19/14 0454  AST 18 12* 17  ALT 10 10 13   ALKPHOS 103 106 106  BILITOT  --   --  1.4*  PROT  --   --  6.9    ALBUMIN  --   --  3.8   No results for input(s): LIPASE, AMYLASE in the last 8760 hours. No results for input(s): AMMONIA in the last 8760 hours. CBC:  Recent Labs  04/10/14 04/19/14 0917 04/20/14 0515  WBC 6.5 6.7 6.7  NEUTROABS  --  5.3  --   HGB 10.6* 11.6* 10.9*  HCT 32* 35.6* 33.1*  MCV  --  94.7 94.0  PLT 150 140* 131*   Lipid Panel:  Recent Labs  11/14/13 03/27/14  CHOL 162 131  HDL 39 39  LDLCALC 97 73  TRIG 128 97    Past Procedures:  04/19/14 CT head and cervical spine wo contrast:  MPRESSION: 1. There is new isodense subdural left hemispheric collection which measures 9 mm maximum thickness. Only small amount of subacute blood products anteriorly see axial image 20. No mass effect or midline shift. Stable atrophy and chronic white matter disease. No definite acute cortical infarction. No mass lesion is noted on this unenhanced scan 2. No cervical spine acute fracture or subluxation. Degenerative changes as described above.   Assessment/Plan Essential hypertension Avoid ACEIs due to CKD. -Given the patient's orthostatics at age, we'll allow for permissive hypertension and avoid aggressive treatment   Gait instability Ambulates with walker with SBA for short distance and w/c to go further.    Dementia Forgetful and requires close supervision for safety and ADLs. Takes Galantamine 8mg . Obtain MMSE     Diabetes mellitus with neurological manifestation Allow for liberal glycemic control -03/27/2014 hemoglobin A1c 6.3  -04/27/14 blood sugar 107     CKD (chronic kidney disease) stage 4, GFR 15-29 ml/min 04/27/14 Bun/creat 25/2.14    Pruritus Chronic. Triamcinolone cram daily to scratched areas and Zyrtec 5mg  po daily.    Subdural hematoma Resulted from fall in his apartment. No new focal neurological symptoms. Risk for fall. Contributory to continued cognitive decline.      History of gastric ulcer Takes Protonix 40mg  daily.  Asymptomatic presently.       Family/ Staff Communication: observe the patient.   Goals of Care: AL vs SNF  Labs/tests ordered: none

## 2014-05-05 NOTE — Assessment & Plan Note (Signed)
Allow for liberal glycemic control -03/27/2014 hemoglobin A1c 6.3  -04/27/14 blood sugar 107  

## 2014-05-05 NOTE — Assessment & Plan Note (Signed)
04/27/14 Bun/creat 25/2.14   

## 2014-05-05 NOTE — Assessment & Plan Note (Signed)
Takes Protonix 40mg daily. Asymptomatic presently.   

## 2014-05-05 NOTE — Assessment & Plan Note (Signed)
Resulted from fall in his apartment. No new focal neurological symptoms. Risk for fall. Contributory to continued cognitive decline.

## 2014-05-05 NOTE — Assessment & Plan Note (Signed)
Forgetful and requires close supervision for safety and ADLs. Takes Galantamine 8mg. Obtain MMSE    

## 2014-05-05 NOTE — Assessment & Plan Note (Signed)
Ambulates with walker with SBA for short distance and w/c to go further.   

## 2014-05-05 NOTE — Assessment & Plan Note (Signed)
Avoid ACEIs due to CKD. -Given the patient's orthostatics at age, we'll allow for permissive hypertension and avoid aggressive treatment 

## 2014-05-05 NOTE — Assessment & Plan Note (Signed)
Chronic. Triamcinolone cram daily to scratched areas and Zyrtec 5mg po daily.   

## 2014-05-09 ENCOUNTER — Encounter: Payer: Self-pay | Admitting: *Deleted

## 2014-05-12 ENCOUNTER — Encounter: Payer: Self-pay | Admitting: Nurse Practitioner

## 2014-05-12 ENCOUNTER — Non-Acute Institutional Stay (SKILLED_NURSING_FACILITY): Payer: Medicare Other | Admitting: Nurse Practitioner

## 2014-05-12 DIAGNOSIS — L299 Pruritus, unspecified: Secondary | ICD-10-CM

## 2014-05-12 DIAGNOSIS — Z8719 Personal history of other diseases of the digestive system: Secondary | ICD-10-CM | POA: Diagnosis not present

## 2014-05-12 DIAGNOSIS — Z8711 Personal history of peptic ulcer disease: Secondary | ICD-10-CM

## 2014-05-12 DIAGNOSIS — I6200 Nontraumatic subdural hemorrhage, unspecified: Secondary | ICD-10-CM | POA: Diagnosis not present

## 2014-05-12 DIAGNOSIS — F329 Major depressive disorder, single episode, unspecified: Secondary | ICD-10-CM

## 2014-05-12 DIAGNOSIS — S065X9A Traumatic subdural hemorrhage with loss of consciousness of unspecified duration, initial encounter: Secondary | ICD-10-CM

## 2014-05-12 DIAGNOSIS — S81801A Unspecified open wound, right lower leg, initial encounter: Secondary | ICD-10-CM

## 2014-05-12 DIAGNOSIS — R2681 Unsteadiness on feet: Secondary | ICD-10-CM

## 2014-05-12 DIAGNOSIS — S065XAA Traumatic subdural hemorrhage with loss of consciousness status unknown, initial encounter: Secondary | ICD-10-CM

## 2014-05-12 DIAGNOSIS — I482 Chronic atrial fibrillation, unspecified: Secondary | ICD-10-CM

## 2014-05-12 DIAGNOSIS — F0390 Unspecified dementia without behavioral disturbance: Secondary | ICD-10-CM | POA: Diagnosis not present

## 2014-05-12 DIAGNOSIS — N184 Chronic kidney disease, stage 4 (severe): Secondary | ICD-10-CM

## 2014-05-12 DIAGNOSIS — E0840 Diabetes mellitus due to underlying condition with diabetic neuropathy, unspecified: Secondary | ICD-10-CM | POA: Diagnosis not present

## 2014-05-12 DIAGNOSIS — I10 Essential (primary) hypertension: Secondary | ICD-10-CM

## 2014-05-12 DIAGNOSIS — F32A Depression, unspecified: Secondary | ICD-10-CM

## 2014-05-12 NOTE — Assessment & Plan Note (Signed)
Several small open areas with mild erythema peri-wounds. Apply Bactroban oint daily. CBC pending

## 2014-05-12 NOTE — Assessment & Plan Note (Signed)
Neurology f/u. Cognition continue to decline. The patient seems sleep more during day. No other new focal neurological symptoms. Continue to observe.

## 2014-05-12 NOTE — Progress Notes (Signed)
Patient ID: Patrick Murray, male   DOB: 11/13/1917, 81 y.o.   MRN: [id number]   Code Status: DNR  Allergies  Allergen Reactions  . Aricept [Donepezil Hcl] Other (See Comments)    Unknown   . Simvastatin Other (See Comments)    Unknown     Chief Complaint  Patient presents with  . Medical Management of Chronic Issues  . Acute Visit    anterior right lower leg wounds.     HPI: Patient is a 81 y.o. male [facility] for evaluation of right lower leg wound and chronic medical conditions.      Hospitalized from 04/19/2014--04/22/2014 for evaluation of s/p fall, subdural hematoma, and CKD   -pt found on floor by caregiver -04/19/2014 echocardiogram shows Inf Wall HK which appears to be new from his previous echo -consulted cardiology to also assist in clarifying if he is having long runs or tachycardia -placed on tele--not concerning dysrhythmias -cardiology interrogated PPM--> no concerning dysrhythmia -hold ASA for now -The patient was orthostatic which possibly contributed to his fall  04/19/2014. CT of the brain in the emergency department revealed a small subdural hematoma. Neurosurgery was consulted and did not feel that the patient needed any interventions at this time. There was no mass effect or effacement. Since admission, the patient has been more alert but remains a little more confused than baseline according to his caregiver. Fortunately, the patient's mental status continued to improve throughout the hospitalization. At the time of discharge, the patient was near his baseline mental status.       Problem List Items Addressed This Visit    Essential hypertension (Chronic)    Avoid ACEIs due to CKD. -Given the patient's orthostatics at age, we'll allow for permissive hypertension and avoid aggressive treatment       Gait instability (Chronic)    Ambulates with walker with SBA for short distance and w/c to go further.        Dementia (Chronic)    Forgetful and requires close supervision for safety and ADLs. Takes Galantamine 8mg . Obtain MMSE         Diabetes mellitus with neurological manifestation (Chronic)    Allow for liberal glycemic control -03/27/2014 hemoglobin A1c 6.3  -04/27/14 blood sugar 107       Pruritus (Chronic)    Chronic. Triamcinolone cram daily to scratched areas and Zyrtec 5mg  po daily.        Subdural hematoma (Chronic)    Neurology f/u. Cognition continue to decline. The patient seems sleep more during day. No other new focal neurological symptoms. Continue to observe.       CKD (chronic kidney disease) stage 4, GFR 15-29 ml/min (Chronic)    04/27/14 Bun/creat 25/2.14        History of gastric ulcer    Takes Protonix 40mg  daily. Asymptomatic presently.        Chronic atrial fibrillation    Heart rate is in control. Takes ASA 81mg  daily.        Depression    Heart rate is in control. Takes ASA 81mg  daily.        Open wound of right lower leg - Primary    Several small open areas with mild erythema peri-wounds. Apply Bactroban oint daily. CBC pending         Review of Systems:  Review of Systems  Constitutional: Negative for fever, chills, diaphoresis, activity change, appetite change, fatigue and unexpected weight change.  HENT: Positive for hearing loss. Negative for congestion, ear pain, facial swelling and nosebleeds.   Eyes: Negative.        Corrective lenses.  Respiratory: Negative for chest tightness and shortness of breath. Cough: dry.   Cardiovascular: Negative for chest pain, palpitations and leg swelling.  Gastrointestinal: Negative for abdominal pain and abdominal distention.       Cough with eating and drinking.  Endocrine: Negative for cold intolerance, heat intolerance, polydipsia, polyphagia and polyuria.       Diabetic.  Genitourinary: Negative.   Musculoskeletal: Positive for myalgias, arthralgias and gait problem. Negative for neck pain  and neck stiffness.  Skin:       Itching skin with scratched injures arms, legs, and nape.   Neurological: Positive for dizziness and weakness. Negative for tremors, seizures, syncope, numbness and headaches.       Balance disturbance. Dementia.  Hematological: Negative.   Psychiatric/Behavioral: Positive for behavioral problems and confusion.     Past Medical History  Diagnosis Date  . Cardiac pacemaker in situ     a.fib w/slow ventricular response  . Atrial fibrillation   . Myalgia and myositis, unspecified   . Unspecified essential hypertension   . Other and unspecified hyperlipidemia   . Sinoatrial node dysfunction   . Acute posthemorrhagic anemia   . Depression   . Gastric ulcer   . Dementia   . Vitamin D deficiency   . Gait disorder   . Chronic kidney disease, stage II (mild)   . Acute gastric ulcer with hemorrhage, without mention of obstruction   . Other malaise and fatigue   . Edema   . Personal history of fall   . Hemorrhage of gastrointestinal tract, unspecified 10/13/11  . Muscle weakness (generalized)   . Anemia, unspecified   . Other specified disease of sebaceous glands     xerosis cutis  . Unspecified pruritic disorder   . Unspecified urinary incontinence 01/01/10  . Unspecified hearing loss   . Peripheral vascular disease, unspecified   . Syncope and collapse 08/08/2008  . Abrasion of face   . Cough   . Type II or unspecified type diabetes mellitus with renal manifestations, not stated as uncontrolled   . Subdural hematoma 04/19/2014    04/19/14 CT head and cervical spine: MPRESSION: 1. There is new isodense subdural left hemispheric collection which measures 9 mm maximum thickness. Only small amount of subacute blood products anteriorly see axial image 20. No mass effect or midline shift. Stable atrophy and chronic white matter disease. No definite acute cortical infarction. No mass lesion is noted on this unenhanced scan 2. No cervical spine acute fracture  or subluxation. Degenerative changes as described above.   . Fall 10/07/2011  . Ecchymosis 04/04/2014  . CKD (chronic kidney disease) stage 4, GFR 15-29 ml/min 04/20/2014  . Chronic atrial fibrillation    Past Surgical History  Procedure Laterality Date  . No past surgeries    . Esophagogastroduodenoscopy  10/08/2011    Procedure: ESOPHAGOGASTRODUODENOSCOPY (EGD);  Surgeon: Willis Modena, MD;  Location: [facility];  Service: Endoscopy;  Laterality: N/A;  . Pacemaker generator change  08/05/07    [facility]  . Pacemaker insertion  2003  . Appendectomy  1926  . Cholecystectomy  1956  . Tonsillectomy  1926   Social History:   reports that he has quit smoking. His smoking use included Pipe. He has never used smokeless tobacco. He reports that he does not drink alcohol or use illicit drugs.  Family  History  Problem Relation Age of Onset  . Heart attack Mother     had a couple  . Heart failure Mother   . Emphysema Father   . Cancer Sister     lung    Medications: Patient's Medications  New Prescriptions   No medications on file  Previous Medications   ASPIRIN 81 MG TABLET    Take 1 tablet (81 mg total) by mouth daily. Start on 05/06/14   CETIRIZINE (ZYRTEC) 5 MG TABLET    Take 5 mg by mouth daily.   CHOLECALCIFEROL (VITAMIN D) 2000 UNITS TABLET    Take 1,000 Units by mouth daily.    DEXTROMETHORPHAN-MENTHOL (DELSYM COUGH RELIEF MT)    Take 30 mg by mouth every 12 (twelve) hours as needed (30 mg/505ml for cough).   ENSURE (ENSURE)    Take 237 mLs by mouth. One can daily   GALANTAMINE (RAZADYNE) 8 MG TABLET    TAKE 1 TABLET TWICE DAILY.   GLUCOSAMINE-CHONDROITIN (GLUCOSAMINE CHONDR COMPLEX PO)    Take 1 capsule by mouth daily.   PANTOPRAZOLE (PROTONIX) 40 MG TABLET    TAKE 1 TABLET DAILY TO REDUCE STOMACH ACID.   PYRITHIONE ZINC (SELSUN BLUE DRY SCALP) 1 % SHAMPOO    Shampoo at least 3 times weekly   SALINE NASAL SPRAY NA    Place into the nose. One spray in each nostril as needed    SERTRALINE (ZOLOFT) 50 MG TABLET    TAKE ONE TABLET DAILY TO HELP WITH NERVES.  Modified Medications   No medications on file  Discontinued Medications   DEXTROMETHORPHAN (DELSYM) 30 MG/5ML LIQUID    Take 10 mLs (60 mg total) by mouth as needed for cough.     Physical Exam: Physical Exam  Constitutional:  Frail elderly male  HENT:  Bilateral hearing aids. Loss of hearing.l. No bleeding or scabs. Tympanic membranes have some scarring bilaterally but no acute injury.  Eyes: Conjunctivae are normal. Pupils are equal, round, and reactive to light.  Neck: Normal range of motion. Neck supple. No JVD present. No tracheal deviation present. No thyromegaly present.  Deviated spine of the nose.  Cardiovascular: Normal rate and intact distal pulses.  Exam reveals no gallop and no friction rub.   No murmur heard. Atrial fib Diminished DP and PT bilaterally  Pulmonary/Chest: Effort normal. No respiratory distress. He has no wheezes. He has no rales.  Abdominal: Soft. Bowel sounds are normal. He exhibits no distension and no mass. There is no tenderness.  Musculoskeletal: Normal range of motion. He exhibits no edema or tenderness.  Poor balance.  Lymphadenopathy:    He has no cervical adenopathy.  Neurological: He is alert. No cranial nerve deficit. Coordination abnormal.  11/22/13 MMSE 17/30. Failed clock drawing. Diminished sensation to vibration and to monofilament testing bilaterally.  Skin: Skin is warm and dry. No rash noted. No erythema. No pallor.  Healed ulcer of the left foot at the proximal 5th metatarsal head. Residual scaly area. Scratched injuries arms and legs and nape. chronic   Psychiatric: He has a normal mood and affect.  Cheerful. Forgetful.   Filed Vitals:   05/12/14 1148  BP: 161/84  Pulse: 72  Temp: 96.9 F (36.1 C)  TempSrc: Tympanic  Resp: 20      Labs reviewed: Basic Metabolic Panel:  Recent Labs  16/10/9602/04/16  04/19/14 0917 04/20/14 0515 04/21/14 0541  04/27/14  NA 137  < > 138 136 138 141  K 4.4  --  4.5  4.3 4.0 4.1  CL  --   --  106 107 109  --   CO2  --   --  --   GLUCOSE  --   --  133* 130* 118*  --   BUN 31*  < > 29* 28* 29* 25*  CREATININE 2.4*  < > 2.38* 2.28* 2.32* 2.1*  CALCIUM  --   --  8.8 8.8 8.7  --   TSH 1.48  --   --   --  1.217  --   < > = values in this interval not displayed. Liver Function Tests:  Recent Labs  03/27/14 04/10/14 04/19/14 0917  AST 18 12* 17  ALT ALKPHOS 103 106 106  BILITOT  --   --  1.4*  PROT  --   --  6.9  ALBUMIN  --   --  3.8   No results for input(s): LIPASE, AMYLASE in the last 8760 hours. No results for input(s): AMMONIA in the last 8760 hours. CBC:  Recent Labs  04/10/14 04/19/14 0917 04/20/14 0515  WBC 6.5 6.7 6.7  NEUTROABS  --  5.3  --   HGB 10.6* 11.6* 10.9*  HCT 32* 35.6* 33.1*  MCV  --  94.7 94.0  PLT 150 140* 131*   Lipid Panel:  Recent Labs  11/14/13 03/27/14  CHOL 162 131  HDL 39 39  LDLCALC 97 73  TRIG 128 97    Past Procedures:  04/19/14 CT head and cervical spine wo contrast:  MPRESSION: 1. There is new isodense subdural left hemispheric collection which measures 9 mm maximum thickness. Only small amount of subacute blood products anteriorly see axial image 20. No mass effect or midline shift. Stable atrophy and chronic white matter disease. No definite acute cortical infarction. No mass lesion is noted on this unenhanced scan 2. No cervical spine acute fracture or subluxation. Degenerative changes as described above.   Assessment/Plan Open wound of right lower leg Several small open areas with mild erythema peri-wounds. Apply Bactroban oint daily. CBC pending   Subdural hematoma Neurology f/u. Cognition continue to decline. The patient seems sleep more during day. No other new focal neurological symptoms. Continue to observe.    Essential hypertension Avoid ACEIs due to CKD. -Given the patient's orthostatics at age,  we'll allow for permissive hypertension and avoid aggressive treatment    Gait instability Ambulates with walker with SBA for short distance and w/c to go further.     Dementia Forgetful and requires close supervision for safety and ADLs. Takes Galantamine . Obtain MMSE      Diabetes mellitus with neurological manifestation Allow for liberal glycemic control -03/27/2014 hemoglobin A1c 6.3  -04/27/14 blood sugar 107    Pruritus Chronic. Triamcinolone cram daily to scratched areas and Zyrtec  po daily.     CKD (chronic kidney disease) stage 4, GFR 15-29 ml/min 04/27/14 Bun/creat 25/2.14     History of gastric ulcer Takes Protonix  daily. Asymptomatic presently.     Chronic atrial fibrillation Heart rate is in control. Takes ASA  daily.     Depression Heart rate is in control. Takes ASA  daily.       Family/ Staff Communication: observe the patient.   Goals of Care: AL vs SNF  Labs/tests ordered: none

## 2014-05-19 ENCOUNTER — Ambulatory Visit (INDEPENDENT_AMBULATORY_CARE_PROVIDER_SITE_OTHER): Payer: Medicare Other | Admitting: Diagnostic Neuroimaging

## 2014-05-19 ENCOUNTER — Encounter: Payer: Self-pay | Admitting: Diagnostic Neuroimaging

## 2014-05-19 VITALS — BP 169/75 | HR 51 | Ht 69.0 in | Wt 151.6 lb

## 2014-05-19 DIAGNOSIS — S065X0A Traumatic subdural hemorrhage without loss of consciousness, initial encounter: Secondary | ICD-10-CM

## 2014-05-19 DIAGNOSIS — F0390 Unspecified dementia without behavioral disturbance: Secondary | ICD-10-CM

## 2014-05-19 NOTE — Assessment & Plan Note (Signed)
Chronic. Triamcinolone cram daily to scratched areas and Zyrtec 5mg  po daily.

## 2014-05-19 NOTE — Assessment & Plan Note (Signed)
Heart rate is in control. Takes ASA 81mg  daily.

## 2014-05-19 NOTE — Assessment & Plan Note (Signed)
Heart rate is in control. Takes ASA 81mg daily.   

## 2014-05-19 NOTE — Assessment & Plan Note (Signed)
Allow for liberal glycemic control -03/27/2014 hemoglobin A1c 6.3  -04/27/14 blood sugar 107

## 2014-05-19 NOTE — Progress Notes (Signed)
[facility]  PATIENT: Patrick Murray DOB: 03/21/1917  REFERRING CLINICIAN: A Green HISTORY FROM: patient and caregiver REASON FOR VISIT: new consult    HISTORICAL  CHIEF COMPLAINT:  Chief Complaint  Patient presents with  . New Evaluation    subdural hematoma     HISTORY OF PRESENT ILLNESS:   81 year old male with hypertension, diabetes, heart disease, sick sinus syndrome status post pacemaker, atrial fibrillation, depression, anxiety, advanced dementia, here for evaluation of subdural hematoma. Patient was admitted April 13 until 04/22/2014 after being found on the ground, sitting up, at 7:00 in the morning on 04/19/58. CT scan of the head demonstrated a small left hemispheric subdural hematoma which was monitored conservatively. Patient's mental status gradually improved throughout hospital course.  Since discharge patient has been fluctuating with his general cognitive status, most consistent with his underlying dementia. Today patient is having a good day, fairly alert and in a good mood according to caregiver. No localized or focal weakness noted by caregiver. Patient is significant hard of hearing, with advanced dementia, and is not aware of his hospital visit.  Patient is DO NOT RESUSCITATE CODE STATUS. His son is his power of attorney and lives out of town. Since discharge patient is moved to skilled nursing facility but previously was in independent living. Patient is not aware this change.   REVIEW OF SYSTEMS: Full 14 system review of systems performed and notable only for swelling in legs hearing loss trouble swallowing itching incontinence cough snoring feeling hot flushing depression anxiety to much sleep decreased energy change in appetite slurred speech difficulty swallowing memory loss confusion.   ALLERGIES: Allergies  Allergen Reactions  . Aricept [Donepezil Hcl] Other (See Comments)    Unknown   . Simvastatin Other (See Comments)   Unknown     HOME MEDICATIONS: Outpatient Prescriptions Prior to Visit  Medication Sig Dispense Refill  . aspirin 81 MG tablet Take 1 tablet (81 mg total) by mouth daily. Start on 05/06/14 30 tablet   . Cholecalciferol (VITAMIN D) 2000 UNITS tablet Take 1,000 Units by mouth daily.     Marland Kitchen. ENSURE (ENSURE) Take 237 mLs by mouth. One can daily    . galantamine (RAZADYNE) 8 MG tablet TAKE 1 TABLET TWICE DAILY. (Patient taking differently: take two tablets every morning) 180 tablet 1  . Glucosamine-Chondroitin (GLUCOSAMINE CHONDR COMPLEX PO) Take 1 capsule by mouth daily.    . pantoprazole (PROTONIX) 40 MG tablet TAKE 1 TABLET DAILY TO REDUCE STOMACH ACID. 90 tablet 1  . SALINE NASAL SPRAY NA Place into the nose. One spray in each nostril as needed    . sertraline (ZOLOFT) 50 MG tablet TAKE ONE TABLET DAILY TO HELP WITH NERVES. 90 tablet 1  . pyrithione zinc (SELSUN BLUE DRY SCALP) 1 % shampoo Shampoo at least 3 times weekly (Patient not taking: Reported on 05/19/2014) 400 mL 12  . dextromethorphan (DELSYM) 30 MG/5ML liquid Take 10 mLs (60 mg total) by mouth as needed for cough. (Patient not taking: Reported on 05/19/2014) 90 mL 3   No facility-administered medications prior to visit.    PAST MEDICAL HISTORY: Past Medical History  Diagnosis Date  . Cardiac pacemaker in situ     a.fib w/slow ventricular response  . Atrial fibrillation   . Myalgia and myositis, unspecified   . Unspecified essential hypertension   . Other and unspecified hyperlipidemia   . Sinoatrial node dysfunction   . Acute posthemorrhagic anemia   . Depression   . Gastric  ulcer   . Dementia   . Vitamin D deficiency   . Gait disorder   . Chronic kidney disease, stage II (mild)   . Acute gastric ulcer with hemorrhage, without mention of obstruction   . Other malaise and fatigue   . Edema   . Personal history of fall   . Hemorrhage of gastrointestinal tract, unspecified 10/13/11  . Muscle weakness (generalized)   .  Anemia, unspecified   . Other specified disease of sebaceous glands     xerosis cutis  . Unspecified pruritic disorder   . Unspecified urinary incontinence 01/01/10  . Unspecified hearing loss   . Peripheral vascular disease, unspecified   . Syncope and collapse 08/08/2008  . Abrasion of face   . Cough   . Type II or unspecified type diabetes mellitus with renal manifestations, not stated as uncontrolled   . Subdural hematoma 04/19/2014    04/19/14 CT head and cervical spine: MPRESSION: 1. There is new isodense subdural left hemispheric collection which measures 9 mm maximum thickness. Only small amount of subacute blood products anteriorly see axial image 20. No mass effect or midline shift. Stable atrophy and chronic white matter disease. No definite acute cortical infarction. No mass lesion is noted on this unenhanced scan 2. No cervical spine acute fracture or subluxation. Degenerative changes as described above.   . Fall 10/07/2011  . Ecchymosis 04/04/2014  . CKD (chronic kidney disease) stage 4, GFR 15-29 ml/min 04/20/2014  . Chronic atrial fibrillation     PAST SURGICAL HISTORY: Past Surgical History  Procedure Laterality Date  . No past surgeries    . Esophagogastroduodenoscopy  10/08/2011    Procedure: ESOPHAGOGASTRODUODENOSCOPY (EGD);  Surgeon: Willis Modena, MD;  Location: [facility];  Service: Endoscopy;  Laterality: N/A;  . Pacemaker generator change  08/05/07    [facility]  . Pacemaker insertion  2003  . Appendectomy  1926  . Cholecystectomy  1956  . Tonsillectomy  1926    FAMILY HISTORY: Family History  Problem Relation Age of Onset  . Heart attack Mother     had a couple  . Heart failure Mother   . Emphysema Father   . Cancer Sister     lung    SOCIAL HISTORY:  History   Social History  . Marital Status: Married    Spouse Name: N/A  . Number of Children: 5  . Years of Education: N/A   Occupational History  . retired     Social History Main Topics  .  Smoking status: Former Smoker -- 5 years    Types: Pipe  . Smokeless tobacco: Never Used  . Alcohol Use: No  . Drug Use: No  . Sexual Activity: No   Other Topics Concern  . Not on file   Social History Narrative   Retired. Walks 5 miles for exercise. Does not use ethanol.    Lives at [facility]    Was in [place]   Has Pacemaker   Living Will     PHYSICAL EXAM  Filed Vitals:   05/19/14 1034  BP: 169/75  Pulse: 51  Height:  (1.753 m)  Weight: 151 lb 9.6 oz (68.765 kg)    Body mass index is 22.38 kg/(m^2).  No exam data present  MMSE - Mini Mental State Exam 11/22/2013 11/22/2013 04/14/2012  Orientation to time 0 0 0  Orientation to Place Registration Attention/ Calculation Recall 2  2 1  Language- name 2 objects Language- repeat Language- follow 3 step command Language- read & follow direction 0 0 1  Write a sentence Copy design 0 0 0  Total score GENERAL EXAM: Patient is in no distress; well developed, nourished and groomed; neck is supple  CARDIOVASCULAR: Regular rate and rhythm, no murmurs, no carotid bruits  NEUROLOGIC: MENTAL STATUS: awake, alert, SIG HARD OF HEARING, language fluent, comprehension intact, naming intact; POOR INSIGHT. TANGENTIAL. CRANIAL NERVE: no papilledema on fundoscopic exam, pupils equal and reactive to light, visual fields full to confrontation, extraocular muscles intact, no nystagmus, facial sensation and strength symmetric, hearing intact, palate elevates symmetrically, uvula midline, shoulder shrug symmetric, tongue midline. MOTOR: normal bulk and tone, BUE 4, BLE 3-4 SENSORY: ABSENT VIB IN KNEES, ANKLES AND TOES; DECR PP AND LT IN BLE COORDINATION: finger-nose-finger, fine finger movements SLOW  REFLEXES: BUE TRACE; BLE 0 GAIT/STATION: IN WHEELCHAIR; CANNOT STAND UNASSISTED    DIAGNOSTIC DATA (LABS, IMAGING, TESTING) - I reviewed patient records,  labs, notes, testing and imaging myself where available.  Lab Results  Component Value Date   WBC 6.7 04/20/2014   HGB 10.9* 04/20/2014   HCT 33.1* 04/20/2014   MCV 94.0 04/20/2014   PLT 131* 04/20/2014      Component Value Date/Time   NA 141 04/27/2014   NA 138 04/21/2014 0541   K 4.1 04/27/2014   CL 109 04/21/2014 0541   CO2 21 04/21/2014 0541   GLUCOSE 118* 04/21/2014 0541   BUN 25* 04/27/2014   BUN 29* 04/21/2014 0541   CREATININE 2.1* 04/27/2014   CREATININE 2.32* 04/21/2014 0541   CALCIUM 8.7 04/21/2014 0541   PROT 6.9 04/19/2014 0917   ALBUMIN 3.8 04/19/2014 0917   AST 17 04/19/2014 0917   ALT 13 04/19/2014 0917   ALKPHOS 106 04/19/2014 0917   BILITOT 1.4* 04/19/2014 0917   GFRNONAA 22* 04/21/2014 0541   GFRAA 26* 04/21/2014 0541   Lab Results  Component Value Date   CHOL 131 03/27/2014   HDL 39 03/27/2014   LDLCALC 73 03/27/2014   TRIG 97 03/27/2014   Lab Results  Component Value Date   HGBA1C 6.3* 03/27/2014   Lab Results  Component Value Date   VITAMINB12 275 09/22/2009   Lab Results  Component Value Date   TSH 1.217 04/21/2014    04/19/14 TTE  - Left ventricle: Moderate basal septal hypertrophy mid and basal inferior wall hypokinesis. The cavity size was mildly dilated. The estimated ejection fraction was 40%. - Aortic valve: Calcified non coronary cusp. - Mitral valve: There was moderate regurgitation. - Left atrium: The atrium was moderately dilated. - Right ventricle: Catheter in RV - Atrial septum: No defect or patent foramen ovale was identified. - Tricuspid valve: There was moderate regurgitation. - Pulmonary arteries: PA peak pressure: 54 mm Hg (S).  04/19/14 CT HEAD / CERVICAL SPINE [I reviewed images myself and agree with interpretation. -VRP]  1. There is new isodense subdural left hemispheric collection which measures 9 mm maximum thickness. Only small amount of subacute blood products anteriorly see axial image 20. No mass effect or  midline shift. Stable atrophy and chronic white matter disease. No definite acute cortical infarction. No mass lesion is noted on this unenhanced scan  2. No cervical spine acute fracture or subluxation. Degenerative changes as described above. These results were called by telephone  at the time of interpretation on 04/19/2014 at 10:15 am to Dr. Bethann BerkshireJOSEPH ZAMMIT, who verbally acknowledged these results.     ASSESSMENT AND PLAN  81 y.o. year old male here with hypertension, diabetes, heart disease, sick sinus syndrome status post pacemaker, atrial fibrillation, depression, anxiety, advanced dementia, here for evaluation of subdural hematoma from April 2016, likely posttraumatic. Patient has gradually improved to baseline. He continues to have fluctuation cognitive status, likely related to underlying dementia. Given patient's advanced age, dementia and other medical conditions he would not be a good surgical candidate. I would favor conservative management and monitor patient. I do not think he needs another CT scan at this point, because this would not change his management.  Dx:  Traumatic subdural hemorrhage without loss of consciousness  Dementia, without behavioral disturbance    PLAN: - monitor symptoms - conservative, palliative approach given his age, dementia and medical conditions - diagnosis, prognosis and treatment options reviewed with patient and caregiver; advanced care planning also reviewed  Return if symptoms worsen or fail to improve, for return to PCP.  I spent 45 minutes of face to face time with patient. Greater than 50% of time was spent in counseling and coordination of care with patient.    Suanne MarkerVIKRAM R. Cypress Fanfan, MD 05/19/2014, 11:07 AM Certified in Neurology, Neurophysiology and Neuroimaging  [facility] [address], [address] [place], [place] [address] [phone]

## 2014-05-19 NOTE — Assessment & Plan Note (Signed)
Avoid ACEIs due to CKD. -Given the patient's orthostatics at age, we'll allow for permissive hypertension and avoid aggressive treatment

## 2014-05-19 NOTE — Assessment & Plan Note (Signed)
Takes Protonix 40mg  daily. Asymptomatic presently.

## 2014-05-19 NOTE — Assessment & Plan Note (Signed)
04/27/14 Bun/creat 25/2.14

## 2014-05-19 NOTE — Assessment & Plan Note (Signed)
Ambulates with walker with SBA for short distance and w/c to go further.

## 2014-05-19 NOTE — Assessment & Plan Note (Signed)
Forgetful and requires close supervision for safety and ADLs. Takes Galantamine 8mg . Obtain MMSE

## 2014-05-31 ENCOUNTER — Telehealth: Payer: Self-pay | Admitting: Diagnostic Neuroimaging

## 2014-05-31 NOTE — Telephone Encounter (Signed)
I called pt's son. i agree with pt staying in skilled nursing facility level of care, with focus on palliative care. All pt's son's questions answered. -VRP

## 2014-05-31 NOTE — Telephone Encounter (Signed)
Pt's son [name] requested to speak with Dr. Marjory LiesPenumalli regarding his father's last visit. He would just like to touch base and follow up with our office and see how the appt went and where his dad stands. He stated that they are in the decision making process of where his father is going to live and would like Dr. Richrd HumblesPenumalli's opinion.[name] has stated that he is very busy during the day but if the nurse calls and does not get him, if she will leave a message on his vm with a better time for him to call back he will set time aside time in his schedule and try to make it as convenient for Dr. Marjory LiesPenumalli or the nurse to reach him as possible. Please call and advise. [phone]) (work phone) [phone]) (Cell)

## 2014-06-09 ENCOUNTER — Encounter: Payer: Self-pay | Admitting: *Deleted

## 2014-06-09 ENCOUNTER — Non-Acute Institutional Stay (SKILLED_NURSING_FACILITY): Payer: Medicare Other | Admitting: Nurse Practitioner

## 2014-06-09 ENCOUNTER — Encounter: Payer: Self-pay | Admitting: Nurse Practitioner

## 2014-06-09 DIAGNOSIS — N184 Chronic kidney disease, stage 4 (severe): Secondary | ICD-10-CM

## 2014-06-09 DIAGNOSIS — I10 Essential (primary) hypertension: Secondary | ICD-10-CM | POA: Diagnosis not present

## 2014-06-09 DIAGNOSIS — I6200 Nontraumatic subdural hemorrhage, unspecified: Secondary | ICD-10-CM

## 2014-06-09 DIAGNOSIS — K219 Gastro-esophageal reflux disease without esophagitis: Secondary | ICD-10-CM | POA: Diagnosis not present

## 2014-06-09 DIAGNOSIS — I482 Chronic atrial fibrillation, unspecified: Secondary | ICD-10-CM

## 2014-06-09 DIAGNOSIS — S065XAA Traumatic subdural hemorrhage with loss of consciousness status unknown, initial encounter: Secondary | ICD-10-CM

## 2014-06-09 DIAGNOSIS — F329 Major depressive disorder, single episode, unspecified: Secondary | ICD-10-CM | POA: Diagnosis not present

## 2014-06-09 DIAGNOSIS — F32A Depression, unspecified: Secondary | ICD-10-CM

## 2014-06-09 DIAGNOSIS — W19XXXS Unspecified fall, sequela: Secondary | ICD-10-CM | POA: Diagnosis not present

## 2014-06-09 DIAGNOSIS — L299 Pruritus, unspecified: Secondary | ICD-10-CM | POA: Diagnosis not present

## 2014-06-09 DIAGNOSIS — R55 Syncope and collapse: Secondary | ICD-10-CM | POA: Diagnosis not present

## 2014-06-09 DIAGNOSIS — S065X9A Traumatic subdural hemorrhage with loss of consciousness of unspecified duration, initial encounter: Secondary | ICD-10-CM

## 2014-06-09 DIAGNOSIS — F0391 Unspecified dementia with behavioral disturbance: Secondary | ICD-10-CM

## 2014-06-09 NOTE — Assessment & Plan Note (Signed)
Rapid decline cognitively. Frequent falling in setting of recent traumatic subdural hematoma. Dc ASA R>B

## 2014-06-09 NOTE — Assessment & Plan Note (Signed)
Stable, dc Zyrtec

## 2014-06-09 NOTE — Assessment & Plan Note (Signed)
Avoid ACEIs due to CKD. -Given the patient's orthostatics at age, we'll allow for permissive hypertension and avoid aggressive treatment

## 2014-06-09 NOTE — Assessment & Plan Note (Signed)
04/27/14 Bun/creat 25/2.14, update CMP

## 2014-06-09 NOTE — Progress Notes (Signed)
Patient ID: Patrick Murray, male   DOB: [birth year], 81 y.o.   MRN: [id number]   Code Status: DNR  Allergies  Allergen Reactions  . Aricept [Donepezil Hcl] Other (See Comments)    Unknown   . Simvastatin Other (See Comments)    Unknown     Chief Complaint  Patient presents with  . Medical Management of Chronic Issues  . Acute Visit    an episode of unconsciousness 06/08/14    HPI: Patient is a 81 y.o. male seen in the SNF at [facility] today for evaluation of an episode of unconsciousness 06/08/14  and chronic medical conditions.      Hospitalized from 04/19/2014--04/22/2014 for evaluation of s/p fall, subdural hematoma, and CKD   -pt found on floor by caregiver -04/19/2014 echocardiogram shows Inf Wall HK which appears to be new from his previous echo -consulted cardiology to also assist in clarifying if he is having long runs or tachycardia -placed on tele--not concerning dysrhythmias -cardiology interrogated PPM--> no concerning dysrhythmia -hold ASA for now -The patient was orthostatic which possibly contributed to his fall  04/19/2014. CT of the brain in the emergency department revealed a small subdural hematoma. Neurosurgery was consulted and did not feel that the patient needed any interventions at this time. There was no mass effect or effacement. Since admission, the patient has been more alert but remains a little more confused than baseline according to his caregiver. Fortunately, the patient's mental status continued to improve throughout the hospitalization. At the time of discharge, the patient was near his baseline mental status.       Problem List Items Addressed This Visit    Essential hypertension (Chronic)    Avoid ACEIs due to CKD. -Given the patient's orthostatics at age, we'll allow for permissive hypertension and avoid aggressive treatment      Dementia (Chronic)    Forgetful and requires close supervision for safety and ADLs. Words salad.  Dc  Galantamine  no longer beneficial.  04/22/14 SLUMS 7/30        Pruritus (Chronic)    Stable, dc Zyrtec      Subdural hematoma (Chronic)    Rapid decline cognitively. Frequent falling in setting of recent traumatic subdural hematoma. Dc ASA R>B      CKD (chronic kidney disease) stage 4, GFR 15-29 ml/min (Chronic)    04/27/14 Bun/creat 25/2.14, update CMP       Fall    Recent falls, hit head, no new focal neurological symptoms, an episode of unconsciousness associated with low Bp 80/40-resolved wo intervention. Lack of safety awareness and increased frailty contributory to his falling. Intensive supervision needed.       Chronic atrial fibrillation    Heart rate is in control      Depression    Heart rate is in control. dc ASA  daily-recent traumatic subdural hematoma and frequent falling.        Transient unconsciousness - Primary    06/08/14 associated with Bp 80/40 in setting of recent traumatic subdural hematoma. Will obtain BP lying and standing. CBC, CMP, UA C/S      GERD (gastroesophageal reflux disease)    Continue Pantoprazole  daily.          Review of Systems:  Review of Systems  Constitutional: Negative for fever, chills, diaphoresis, activity change, appetite change, fatigue and unexpected weight change.  HENT: Positive for hearing loss. Negative for congestion, ear pain, facial swelling and nosebleeds.   Eyes: Negative.  Corrective lenses.  Respiratory: Negative for chest tightness and shortness of breath. Cough: dry.   Cardiovascular: Negative for chest pain, palpitations and leg swelling.  Gastrointestinal: Negative for abdominal pain and abdominal distention.       Cough with eating and drinking.  Endocrine: Negative for cold intolerance, heat intolerance, polydipsia, polyphagia and polyuria.       Diabetic.  Genitourinary: Negative.   Musculoskeletal: Positive for myalgias, arthralgias and gait problem. Negative for neck pain and  neck stiffness.  Skin:       Itching skin with scratched injures arms, legs, and nape.   Neurological: Positive for dizziness and weakness. Negative for tremors, seizures, syncope, numbness and headaches.       Balance disturbance. Dementia.  Hematological: Negative.   Psychiatric/Behavioral: Positive for behavioral problems and confusion.     Past Medical History  Diagnosis Date  . Cardiac pacemaker in situ     a.fib w/slow ventricular response  . Atrial fibrillation   . Myalgia and myositis, unspecified   . Unspecified essential hypertension   . Other and unspecified hyperlipidemia   . Sinoatrial node dysfunction   . Acute posthemorrhagic anemia   . Depression   . Gastric ulcer   . Dementia   . Vitamin D deficiency   . Gait disorder   . Chronic kidney disease, stage II (mild)   . Acute gastric ulcer with hemorrhage, without mention of obstruction   . Other malaise and fatigue   . Edema   . Personal history of fall   . Hemorrhage of gastrointestinal tract, unspecified 10/13/11  . Muscle weakness (generalized)   . Anemia, unspecified   . Other specified disease of sebaceous glands     xerosis cutis  . Unspecified pruritic disorder   . Unspecified urinary incontinence 01/01/10  . Unspecified hearing loss   . Peripheral vascular disease, unspecified   . Syncope and collapse 08/08/2008  . Abrasion of face   . Cough   . Type II or unspecified type diabetes mellitus with renal manifestations, not stated as uncontrolled   . Subdural hematoma 04/19/2014    04/19/14 CT head and cervical spine: MPRESSION: 1. There is new isodense subdural left hemispheric collection which measures 9 mm maximum thickness. Only small amount of subacute blood products anteriorly see axial image 20. No mass effect or midline shift. Stable atrophy and chronic white matter disease. No definite acute cortical infarction. No mass lesion is noted on this unenhanced scan 2. No cervical spine acute fracture or  subluxation. Degenerative changes as described above.   . Fall 10/07/2011  . Ecchymosis 04/04/2014  . CKD (chronic kidney disease) stage 4, GFR 15-29 ml/min 04/20/2014  . Chronic atrial fibrillation    Past Surgical History  Procedure Laterality Date  . No past surgeries    . Esophagogastroduodenoscopy  10/08/2011    Procedure: ESOPHAGOGASTRODUODENOSCOPY (EGD);  Surgeon: Willis Modena, MD;  Location: [facility];  Service: Endoscopy;  Laterality: N/A;  . Pacemaker generator change  08/05/07    [facility]  . Pacemaker insertion  2003  . Appendectomy  1926  . Cholecystectomy  1956  . Tonsillectomy  1926   Social History:   reports that he has quit smoking. His smoking use included Pipe. He has never used smokeless tobacco. He reports that he does not drink alcohol or use illicit drugs.  Family History  Problem Relation Age of Onset  . Heart attack Mother     had a couple  . Heart failure Mother   .  Emphysema Father   . Cancer Sister     lung    Medications: Patient's Medications  New Prescriptions   No medications on file  Previous Medications   ASPIRIN 81 MG TABLET    Take 1 tablet (81 mg total) by mouth daily. Start on 05/06/14   CETIRIZINE (ZYRTEC) 5 MG TABLET    Take 5 mg by mouth daily.   CHOLECALCIFEROL (VITAMIN D) 2000 UNITS TABLET    Take 1,000 Units by mouth daily.    DEXTROMETHORPHAN-MENTHOL (DELSYM COUGH RELIEF MT)    Take 30 mg by mouth every 12 (twelve) hours as needed (30 mg/585ml for cough).   ENSURE (ENSURE)    Take 237 mLs by mouth. One can daily   GALANTAMINE (RAZADYNE) 8 MG TABLET    TAKE 1 TABLET TWICE DAILY.   GLUCOSAMINE-CHONDROITIN (GLUCOSAMINE CHONDR COMPLEX PO)    Take 1 capsule by mouth daily.   PANTOPRAZOLE (PROTONIX) 40 MG TABLET    TAKE 1 TABLET DAILY TO REDUCE STOMACH ACID.   PYRITHIONE ZINC (SELSUN BLUE DRY SCALP) 1 % SHAMPOO    Shampoo at least 3 times weekly   SALINE NASAL SPRAY NA    Place into the nose. One spray in each nostril as needed    SERTRALINE (ZOLOFT) 50 MG TABLET    TAKE ONE TABLET DAILY TO HELP WITH NERVES.  Modified Medications   No medications on file  Discontinued Medications   No medications on file     Physical Exam: Physical Exam  Constitutional:  Frail elderly male  HENT:  Bilateral hearing aids. Loss of hearing.l. No bleeding or scabs. Tympanic membranes have some scarring bilaterally but no acute injury.  Eyes: Conjunctivae are normal. Pupils are equal, round, and reactive to light.  Neck: Normal range of motion. Neck supple. No JVD present. No tracheal deviation present. No thyromegaly present.  Deviated spine of the nose.  Cardiovascular: Normal rate and intact distal pulses.  Exam reveals no gallop and no friction rub.   No murmur heard. Atrial fib Diminished DP and PT bilaterally  Pulmonary/Chest: Effort normal. No respiratory distress. He has no wheezes. He has no rales.  Abdominal: Soft. Bowel sounds are normal. He exhibits no distension and no mass. There is no tenderness.  Musculoskeletal: Normal range of motion. He exhibits no edema or tenderness.  Poor balance.  Lymphadenopathy:    He has no cervical adenopathy.  Neurological: He is alert. No cranial nerve deficit. Coordination abnormal.  11/22/13 MMSE 17/30. Failed clock drawing. Diminished sensation to vibration and to monofilament testing bilaterally.  Skin: Skin is warm and dry. No rash noted. No erythema. No pallor.  Healed ulcer of the left foot at the proximal 5th metatarsal head. Residual scaly area. Scratched injuries arms and legs and nape. chronic   Psychiatric: He has a normal mood and affect.  Cheerful. Forgetful.   Filed Vitals:   06/09/14 1102  BP: 160/60  Pulse: 86  Temp: 98.2 F (36.8 C)  TempSrc: Tympanic  Resp: 16      Labs reviewed: Basic Metabolic Panel:  Recent Labs  16/10/9602/04/16  04/19/14 0917 04/20/14 0515 04/21/14 0541 04/27/14  NA 137  < > 138 136 138 141  K 4.4  --  4.5 4.3 4.0 4.1  CL  --    --  106 107 109  --   CO2  --   --  23 20 21   --   GLUCOSE  --   --  133* 130* 118*  --  BUN 31*  < > 29* 28* 29* 25*  CREATININE 2.4*  < > 2.38* 2.28* 2.32* 2.1*  CALCIUM  --   --  8.8 8.8 8.7  --   TSH 1.48  --   --   --  1.217  --   < > = values in this interval not displayed. Liver Function Tests:  Recent Labs  03/27/14 04/10/14 04/19/14 0917  AST 18 12* 17  ALT ALKPHOS 103 106 106  BILITOT  --   --  1.4*  PROT  --   --  6.9  ALBUMIN  --   --  3.8   No results for input(s): LIPASE, AMYLASE in the last 8760 hours. No results for input(s): AMMONIA in the last 8760 hours. CBC:  Recent Labs  04/10/14 04/19/14 0917 04/20/14 0515  WBC 6.5 6.7 6.7  NEUTROABS  --  5.3  --   HGB 10.6* 11.6* 10.9*  HCT 32* 35.6* 33.1*  MCV  --  94.7 94.0  PLT 150 140* 131*   Lipid Panel:  Recent Labs  11/14/13 03/27/14  CHOL 162 131  HDL 39 39  LDLCALC 97 73  TRIG 128 97    Past Procedures:  04/19/14 CT head and cervical spine wo contrast:  MPRESSION: 1. There is new isodense subdural left hemispheric collection which measures 9 mm maximum thickness. Only small amount of subacute blood products anteriorly see axial image 20. No mass effect or midline shift. Stable atrophy and chronic white matter disease. No definite acute cortical infarction. No mass lesion is noted on this unenhanced scan 2. No cervical spine acute fracture or subluxation. Degenerative changes as described above.  06/07/14 X-ray R+L hip: no acute fracture or dislocation. Moderate osteoarthritis.   Assessment/Plan Fall Recent falls, hit head, no new focal neurological symptoms, an episode of unconsciousness associated with low Bp 80/40-resolved wo intervention. Lack of safety awareness and increased frailty contributory to his falling. Intensive supervision needed.    Transient unconsciousness 06/08/14 associated with Bp 80/40 in setting of recent traumatic subdural hematoma. Will obtain BP lying  and standing. CBC, CMP, UA C/S   Dementia Forgetful and requires close supervision for safety and ADLs. Words salad.  Dc Galantamine  no longer beneficial.  04/22/14 SLUMS 7/30     Subdural hematoma Rapid decline cognitively. Frequent falling in setting of recent traumatic subdural hematoma. Dc ASA R>B   GERD (gastroesophageal reflux disease) Continue Pantoprazole  daily.    Depression Heart rate is in control. dc ASA  daily-recent traumatic subdural hematoma and frequent falling.     Essential hypertension Avoid ACEIs due to CKD. -Given the patient's orthostatics at age, we'll allow for permissive hypertension and avoid aggressive treatment   Pruritus Stable, dc Zyrtec   CKD (chronic kidney disease) stage 4, GFR 15-29 ml/min 04/27/14 Bun/creat 25/2.14, update CMP    Chronic atrial fibrillation Heart rate is in control     Family/ Staff Communication: observe the patient.   Goals of Care: SNF  Labs/tests ordered: CBC CMP UA C/S

## 2014-06-09 NOTE — Assessment & Plan Note (Addendum)
Forgetful and requires close supervision for safety and ADLs. Words salad.  Dc Galantamine 8mg  no longer beneficial.  04/22/14 SLUMS 7/30

## 2014-06-09 NOTE — Assessment & Plan Note (Signed)
Continue Pantoprazole 40 mg daily

## 2014-06-09 NOTE — Assessment & Plan Note (Signed)
Heart rate is in control. dc ASA 81mg  daily-recent traumatic subdural hematoma and frequent falling.

## 2014-06-09 NOTE — Assessment & Plan Note (Addendum)
06/08/14 associated with Bp 80/40 in setting of recent traumatic subdural hematoma. Will obtain BP lying and standing. CBC, CMP, UA C/S

## 2014-06-09 NOTE — Assessment & Plan Note (Signed)
Heart rate is in control.  

## 2014-06-09 NOTE — Assessment & Plan Note (Signed)
Recent falls, hit head, no new focal neurological symptoms, an episode of unconsciousness associated with low Bp 80/40-resolved wo intervention. Lack of safety awareness and increased frailty contributory to his falling. Intensive supervision needed.

## 2014-06-11 LAB — CBC AND DIFFERENTIAL
HCT: 26 % — AB (ref 41–53)
Hemoglobin: 8.7 g/dL — AB (ref 13.5–17.5)
Platelets: 141 10*3/uL — AB (ref 150–399)
WBC: 5.8 10^3/mL

## 2014-06-12 LAB — BASIC METABOLIC PANEL
BUN: 42 mg/dL — AB (ref 4–21)
Creatinine: 2.2 mg/dL — AB (ref 0.6–1.3)
Glucose: 117 mg/dL
Potassium: 4.2 mmol/L (ref 3.4–5.3)
Sodium: 142 mmol/L (ref 137–147)

## 2014-06-12 LAB — HEPATIC FUNCTION PANEL
ALT: 13 U/L (ref 10–40)
AST: 15 U/L (ref 14–40)
Alkaline Phosphatase: 76 U/L (ref 25–125)
BILIRUBIN, TOTAL: 1.8 mg/dL

## 2014-06-12 LAB — CBC AND DIFFERENTIAL
HEMATOCRIT: 25 % — AB (ref 41–53)
Hemoglobin: 8.2 g/dL — AB (ref 13.5–17.5)
Platelets: 9 10*3/uL — AB (ref 150–399)
WBC: 5.1 10*3/mL

## 2014-06-13 ENCOUNTER — Encounter: Payer: Self-pay | Admitting: Nurse Practitioner

## 2014-06-13 ENCOUNTER — Non-Acute Institutional Stay (SKILLED_NURSING_FACILITY): Payer: Medicare Other | Admitting: Nurse Practitioner

## 2014-06-13 DIAGNOSIS — S065X9A Traumatic subdural hemorrhage with loss of consciousness of unspecified duration, initial encounter: Secondary | ICD-10-CM

## 2014-06-13 DIAGNOSIS — D500 Iron deficiency anemia secondary to blood loss (chronic): Secondary | ICD-10-CM | POA: Diagnosis not present

## 2014-06-13 DIAGNOSIS — Z8719 Personal history of other diseases of the digestive system: Secondary | ICD-10-CM

## 2014-06-13 DIAGNOSIS — F329 Major depressive disorder, single episode, unspecified: Secondary | ICD-10-CM

## 2014-06-13 DIAGNOSIS — I10 Essential (primary) hypertension: Secondary | ICD-10-CM

## 2014-06-13 DIAGNOSIS — F0391 Unspecified dementia with behavioral disturbance: Secondary | ICD-10-CM

## 2014-06-13 DIAGNOSIS — I482 Chronic atrial fibrillation, unspecified: Secondary | ICD-10-CM

## 2014-06-13 DIAGNOSIS — N184 Chronic kidney disease, stage 4 (severe): Secondary | ICD-10-CM | POA: Diagnosis not present

## 2014-06-13 DIAGNOSIS — Z8711 Personal history of peptic ulcer disease: Secondary | ICD-10-CM

## 2014-06-13 DIAGNOSIS — S065XAA Traumatic subdural hemorrhage with loss of consciousness status unknown, initial encounter: Secondary | ICD-10-CM

## 2014-06-13 DIAGNOSIS — R55 Syncope and collapse: Secondary | ICD-10-CM

## 2014-06-13 DIAGNOSIS — I6200 Nontraumatic subdural hemorrhage, unspecified: Secondary | ICD-10-CM | POA: Diagnosis not present

## 2014-06-13 DIAGNOSIS — F32A Depression, unspecified: Secondary | ICD-10-CM

## 2014-06-13 LAB — CBC AND DIFFERENTIAL
HCT: 25 % — AB (ref 41–53)
HEMOGLOBIN: 8.2 g/dL — AB (ref 13.5–17.5)
Platelets: 140 10*3/uL — AB (ref 150–399)
WBC: 5.1 10^3/mL

## 2014-06-13 NOTE — Assessment & Plan Note (Signed)
06/13/14 off Galantamine. Prn Lorazepam

## 2014-06-13 NOTE — Assessment & Plan Note (Signed)
Negative urine culture

## 2014-06-13 NOTE — Progress Notes (Signed)
Patient ID: Patrick Murray, male   DOB: 30-Oct-1917, 81 y.o.   MRN: [id number]   Code Status: DNR  Allergies  Allergen Reactions  . Aricept [Donepezil Hcl] Other (See Comments)    Unknown   . Simvastatin Other (See Comments)    Unknown     Chief Complaint  Patient presents with  . Medical Management of Chronic Issues  . Acute Visit    anemia    HPI: Patient is a 81 y.o. male seen in the SNF at [facility] today for evaluation of an episode of unconsciousness 06/08/14  and chronic medical conditions.      Hospitalized from 04/19/2014--04/22/2014 for evaluation of s/p fall, subdural hematoma, and CKD   -pt found on floor by caregiver -04/19/2014 echocardiogram shows Inf Wall HK which appears to be new from his previous echo -consulted cardiology to also assist in clarifying if he is having long runs or tachycardia -placed on tele--not concerning dysrhythmias -cardiology interrogated PPM--> no concerning dysrhythmia -hold ASA for now -The patient was orthostatic which possibly contributed to his fall  04/19/2014. CT of the brain in the emergency department revealed a small subdural hematoma. Neurosurgery was consulted and did not feel that the patient needed any interventions at this time. There was no mass effect or effacement. Since admission, the patient has been more alert but remains a little more confused than baseline according to his caregiver. Fortunately, the patient's mental status continued to improve throughout the hospitalization. At the time of discharge, the patient was near his baseline mental status.       Problem List Items Addressed This Visit    Essential hypertension (Chronic)    Controlled. Off med.       Dementia (Chronic)    06/13/14 off Galantamine. Prn Lorazepam       Subdural hematoma (Chronic)    Sleeps a lot.  Ward salad. But no new focal neurological deficits.       CKD (chronic kidney disease) stage 4, GFR 15-29 ml/min (Chronic)   04/27/14 Bun/creat 25/2.14. CMP pending.        History of gastric ulcer    Continue Protonix. Possible etiology of dropping Hgb from 10 to 8       Chronic atrial fibrillation    Heart rate is in control. Off ASA  daily R>B        Depression    Continue Sertraline  for now-may GDR      Transient unconsciousness    No further occurrence. Orthostatic blood pressure change is evident.       Anemia due to chronic blood loss - Primary    06/11/14 Hgb 8.7 06/13/14 Hgb 8.2, Iron 74, B12 399-pending FOBT stoolsx 3. Adding Iron  daily. Repeat CBC in am.           Review of Systems:  Review of Systems  Constitutional: Negative for fever, chills, diaphoresis, activity change, appetite change, fatigue and unexpected weight change.  HENT: Positive for hearing loss. Negative for congestion, ear pain, facial swelling and nosebleeds.   Eyes: Negative.        Corrective lenses.  Respiratory: Negative for chest tightness and shortness of breath. Cough: dry.   Cardiovascular: Negative for chest pain, palpitations and leg swelling.  Gastrointestinal: Negative for abdominal pain and abdominal distention.       Cough with eating and drinking.  Endocrine: Negative for cold intolerance, heat intolerance, polydipsia, polyphagia and polyuria.       Diabetic.  Genitourinary: Negative.   Musculoskeletal: Positive for myalgias, arthralgias and gait problem. Negative for neck pain and neck stiffness.  Skin:       Itching skin with scratched injures arms, legs, and nape.   Neurological: Positive for dizziness and weakness. Negative for tremors, seizures, syncope, numbness and headaches.       Balance disturbance. Dementia.  Hematological: Negative.   Psychiatric/Behavioral: Positive for behavioral problems and confusion.     Past Medical History  Diagnosis Date  . Cardiac pacemaker in situ     a.fib w/slow ventricular response  . Atrial fibrillation   . Myalgia and myositis,  unspecified   . Unspecified essential hypertension   . Other and unspecified hyperlipidemia   . Sinoatrial node dysfunction   . Acute posthemorrhagic anemia   . Depression   . Gastric ulcer   . Dementia   . Vitamin D deficiency   . Gait disorder   . Chronic kidney disease, stage II (mild)   . Acute gastric ulcer with hemorrhage, without mention of obstruction   . Other malaise and fatigue   . Edema   . Personal history of fall   . Hemorrhage of gastrointestinal tract, unspecified 10/13/11  . Muscle weakness (generalized)   . Anemia, unspecified   . Other specified disease of sebaceous glands     xerosis cutis  . Unspecified pruritic disorder   . Unspecified urinary incontinence 01/01/10  . Unspecified hearing loss   . Peripheral vascular disease, unspecified   . Syncope and collapse 08/08/2008  . Abrasion of face   . Cough   . Type II or unspecified type diabetes mellitus with renal manifestations, not stated as uncontrolled   . Subdural hematoma 04/19/2014    04/19/14 CT head and cervical spine: MPRESSION: 1. There is new isodense subdural left hemispheric collection which measures 9 mm maximum thickness. Only small amount of subacute blood products anteriorly see axial image 20. No mass effect or midline shift. Stable atrophy and chronic white matter disease. No definite acute cortical infarction. No mass lesion is noted on this unenhanced scan 2. No cervical spine acute fracture or subluxation. Degenerative changes as described above.   . Fall 10/07/2011  . Ecchymosis 04/04/2014  . CKD (chronic kidney disease) stage 4, GFR 15-29 ml/min 04/20/2014  . Chronic atrial fibrillation    Past Surgical History  Procedure Laterality Date  . No past surgeries    . Esophagogastroduodenoscopy  10/08/2011    Procedure: ESOPHAGOGASTRODUODENOSCOPY (EGD);  Surgeon: Willis Modena, MD;  Location: [facility];  Service: Endoscopy;  Laterality: N/A;  . Pacemaker generator change  08/05/07    [facility]    . Pacemaker insertion  2003  . Appendectomy  1926  . Cholecystectomy  1956  . Tonsillectomy  1926   Social History:   reports that he has quit smoking. His smoking use included Pipe. He has never used smokeless tobacco. He reports that he does not drink alcohol or use illicit drugs.  Family History  Problem Relation Age of Onset  . Heart attack Mother     had a couple  . Heart failure Mother   . Emphysema Father   . Cancer Sister     lung    Medications: Patient's Medications  New Prescriptions   No medications on file  Previous Medications   ASPIRIN 81 MG TABLET    Take 1 tablet (81 mg total) by mouth daily. Start on 05/06/14   CETIRIZINE (ZYRTEC) 5 MG TABLET    Take  5 mg by mouth daily.   CHOLECALCIFEROL (VITAMIN D) 2000 UNITS TABLET    Take 1,000 Units by mouth daily.    DEXTROMETHORPHAN-MENTHOL (DELSYM COUGH RELIEF MT)    Take 30 mg by mouth every 12 (twelve) hours as needed (30 mg/86ml for cough).   ENSURE (ENSURE)    Take 237 mLs by mouth. One can daily   GALANTAMINE (RAZADYNE) 8 MG TABLET    TAKE 1 TABLET TWICE DAILY.   GLUCOSAMINE-CHONDROITIN (GLUCOSAMINE CHONDR COMPLEX PO)    Take 1 capsule by mouth daily.   PANTOPRAZOLE (PROTONIX) 40 MG TABLET    TAKE 1 TABLET DAILY TO REDUCE STOMACH ACID.   PYRITHIONE ZINC (SELSUN BLUE DRY SCALP) 1 % SHAMPOO    Shampoo at least 3 times weekly   SALINE NASAL SPRAY NA    Place into the nose. One spray in each nostril as needed   SERTRALINE (ZOLOFT) 50 MG TABLET    TAKE ONE TABLET DAILY TO HELP WITH NERVES.  Modified Medications   No medications on file  Discontinued Medications   No medications on file     Physical Exam: Physical Exam  Constitutional:  Frail elderly male  HENT:  Bilateral hearing aids. Loss of hearing.l. No bleeding or scabs. Tympanic membranes have some scarring bilaterally but no acute injury.  Eyes: Conjunctivae are normal. Pupils are equal, round, and reactive to light.  Neck: Normal range of motion. Neck  supple. No JVD present. No tracheal deviation present. No thyromegaly present.  Deviated spine of the nose.  Cardiovascular: Normal rate and intact distal pulses.  Exam reveals no gallop and no friction rub.   No murmur heard. Atrial fib Diminished DP and PT bilaterally  Pulmonary/Chest: Effort normal. No respiratory distress. He has no wheezes. He has no rales.  Abdominal: Soft. Bowel sounds are normal. He exhibits no distension and no mass. There is no tenderness.  Musculoskeletal: Normal range of motion. He exhibits no edema or tenderness.  Poor balance.  Lymphadenopathy:    He has no cervical adenopathy.  Neurological: He is alert. No cranial nerve deficit. Coordination abnormal.  11/22/13 MMSE 17/30. Failed clock drawing. Diminished sensation to vibration and to monofilament testing bilaterally.  Skin: Skin is warm and dry. No rash noted. No erythema. No pallor.  Healed ulcer of the left foot at the proximal 5th metatarsal head. Residual scaly area. Scratched injuries arms and legs and nape. chronic   Psychiatric: He has a normal mood and affect.  Cheerful. Forgetful.   Filed Vitals:   06/13/14 1104  BP: 154/82  Pulse: 94  Temp: 97.9 F (36.6 C)  TempSrc: Tympanic  Resp: 20      Labs reviewed: Basic Metabolic Panel:  Recent Labs  16/10/96  04/19/14 0917 04/20/14 0515 04/21/14 0541 04/27/14  NA 137  < > 138 136 138 141  K 4.4  --  4.5 4.3 4.0 4.1  CL  --   --  106 107 109  --   CO2  --   --  --   GLUCOSE  --   --  133* 130* 118*  --   BUN 31*  < > 29* 28* 29* 25*  CREATININE 2.4*  < > 2.38* 2.28* 2.32* 2.1*  CALCIUM  --   --  8.8 8.8 8.7  --   TSH 1.48  --   --   --  1.217  --   < > = values in this interval not displayed. Liver  Function Tests:  Recent Labs  03/27/14 04/10/14 04/19/14 0917  AST 18 12* 17  ALT 10 10 13   ALKPHOS 103 106 106  BILITOT  --   --  1.4*  PROT  --   --  6.9  ALBUMIN  --   --  3.8   No results for input(s): LIPASE,  AMYLASE in the last 8760 hours. No results for input(s): AMMONIA in the last 8760 hours. CBC:  Recent Labs  04/19/14 0917 04/20/14 0515 06/11/14 06/13/14  WBC 6.7 6.7 5.8 5.1  NEUTROABS 5.3  --   --   --   HGB 11.6* 10.9* 8.7* 8.2*  HCT 35.6* 33.1* 26* 25*  MCV 94.7 94.0  --   --   PLT 140* 131* 141* 140*   Lipid Panel:  Recent Labs  11/14/13 03/27/14  CHOL 162 131  HDL 39 39  LDLCALC 97 73  TRIG 128 97    Past Procedures:  04/19/14 CT head and cervical spine wo contrast:  MPRESSION: 1. There is new isodense subdural left hemispheric collection which measures 9 mm maximum thickness. Only small amount of subacute blood products anteriorly see axial image 20. No mass effect or midline shift. Stable atrophy and chronic white matter disease. No definite acute cortical infarction. No mass lesion is noted on this unenhanced scan 2. No cervical spine acute fracture or subluxation. Degenerative changes as described above.  06/07/14 X-ray R+L hip: no acute fracture or dislocation. Moderate osteoarthritis.   Assessment/Plan Anemia due to chronic blood loss 06/11/14 Hgb 8.7 06/13/14 Hgb 8.2, Iron 74, B12 399-pending FOBT stoolsx 3. Adding Iron 325mg  daily. Repeat CBC in am.     Essential hypertension Controlled. Off med.    Dementia 06/13/14 off Galantamine. Prn Lorazepam    CKD (chronic kidney disease) stage 4, GFR 15-29 ml/min 04/27/14 Bun/creat 25/2.14. CMP pending.     Subdural hematoma Sleeps a lot.  Ward salad. But no new focal neurological deficits.    Chronic atrial fibrillation Heart rate is in control. Off ASA 81mg  daily R>B     History of gastric ulcer Continue Protonix. Possible etiology of dropping Hgb from 10 to 8    Depression Continue Sertraline 50mg  for now-may GDR   UTI (urinary tract infection) Negative urine culture.    Transient unconsciousness No further occurrence. Orthostatic blood pressure change is evident.      Family/  Staff Communication: observe the patient.   Goals of Care: SNF  Labs/tests ordered: CBC

## 2014-06-13 NOTE — Assessment & Plan Note (Signed)
04/27/14 Bun/creat 25/2.14. CMP pending.

## 2014-06-13 NOTE — Assessment & Plan Note (Signed)
06/11/14 Hgb 8.7 06/13/14 Hgb 8.2, Iron 74, B12 399-pending FOBT stoolsx 3. Adding Iron 325mg  daily. Repeat CBC in am.

## 2014-06-13 NOTE — Assessment & Plan Note (Signed)
Controlled. Off med   

## 2014-06-13 NOTE — Assessment & Plan Note (Signed)
Sleeps a lot.  Ward salad. But no new focal neurological deficits.

## 2014-06-13 NOTE — Assessment & Plan Note (Signed)
Heart rate is in control. Off ASA 81mg  daily R>B

## 2014-06-13 NOTE — Assessment & Plan Note (Signed)
No further occurrence. Orthostatic blood pressure change is evident.

## 2014-06-13 NOTE — Assessment & Plan Note (Signed)
Continue Sertraline 50mg  for now-may GDR

## 2014-06-13 NOTE — Assessment & Plan Note (Signed)
Continue Protonix. Possible etiology of dropping Hgb from 10 to 8

## 2014-06-14 LAB — CBC AND DIFFERENTIAL
HEMATOCRIT: 27 % — AB (ref 41–53)
HEMOGLOBIN: 9.1 g/dL — AB (ref 13.5–17.5)
PLATELETS: 155 10*3/uL (ref 150–399)
WBC: 8 10*3/mL

## 2014-06-15 ENCOUNTER — Telehealth: Payer: Self-pay | Admitting: Diagnostic Neuroimaging

## 2014-06-15 NOTE — Telephone Encounter (Signed)
Patrick Murray, pt's son called wanting to give a heads up to Dr. Marjory Lies in regarding to patient's behavior has changed, his mental capacity, and his dementia is acting up. The nursing home will call and schedule an appointment for the patient to be seen. Patrick Murray is not requesting a call back but if Dr. Marjory Lies would like to reach out to him, Patrick Murray can be reached @  [phone]

## 2014-06-16 ENCOUNTER — Other Ambulatory Visit: Payer: Self-pay | Admitting: Nurse Practitioner

## 2014-06-16 DIAGNOSIS — D500 Iron deficiency anemia secondary to blood loss (chronic): Secondary | ICD-10-CM

## 2014-06-16 DIAGNOSIS — N184 Chronic kidney disease, stage 4 (severe): Secondary | ICD-10-CM

## 2014-06-16 DIAGNOSIS — N189 Chronic kidney disease, unspecified: Secondary | ICD-10-CM

## 2014-06-16 DIAGNOSIS — D631 Anemia in chronic kidney disease: Secondary | ICD-10-CM

## 2014-06-20 ENCOUNTER — Ambulatory Visit (INDEPENDENT_AMBULATORY_CARE_PROVIDER_SITE_OTHER): Payer: Medicare Other | Admitting: Diagnostic Neuroimaging

## 2014-06-20 ENCOUNTER — Encounter: Payer: Self-pay | Admitting: Diagnostic Neuroimaging

## 2014-06-20 VITALS — BP 111/50 | HR 65 | Ht 69.0 in

## 2014-06-20 DIAGNOSIS — S065X0A Traumatic subdural hemorrhage without loss of consciousness, initial encounter: Secondary | ICD-10-CM

## 2014-06-20 DIAGNOSIS — F0390 Unspecified dementia without behavioral disturbance: Secondary | ICD-10-CM

## 2014-06-20 NOTE — Progress Notes (Signed)
[facility]  PATIENT: Patrick Murray DOB: 14-Mar-1917  REFERRING CLINICIAN: A Green / Man Mast HISTORY FROM: patient and caregiver REASON FOR VISIT: follow up    HISTORICAL  CHIEF COMPLAINT:  Chief Complaint  Patient presents with  . Follow-up    increasing falls     HISTORY OF PRESENT ILLNESS:   UPDATE 06/20/14: Since last visit, continued falls, neurologic decline, more sleeping. Nurse Burnice Logan requested evaluation. I spoke with her. Son may have requested further neuro eval. Mast NP and Dr. Chilton Si were notified, but did not request neuro eval, as per my last note.   PRIOR HPI (05/19/14): 81 year old male with hypertension, diabetes, heart disease, sick sinus syndrome status post pacemaker, atrial fibrillation, depression, anxiety, advanced dementia, here for evaluation of subdural hematoma. Patient was admitted April 13 until 04/22/2014 after being found on the ground, sitting up, at 7:00 in the morning on 04/19/58. CT scan of the head demonstrated a small left hemispheric subdural hematoma which was monitored conservatively. Patient's mental status gradually improved throughout hospital course. Since discharge patient has been fluctuating with his general cognitive status, most consistent with his underlying dementia. Today patient is having a good day, fairly alert and in a good mood according to caregiver. No localized or focal weakness noted by caregiver. Patient is significant hard of hearing, with advanced dementia, and is not aware of his hospital visit. Patient is DO NOT RESUSCITATE CODE STATUS. His son is his power of attorney and lives out of town. Since discharge patient is moved to skilled nursing facility but previously was in independent living. Patient is not aware this change.   REVIEW OF SYSTEMS: Full 14 system review of systems performed and notable only for anemia trouble swallowing.    ALLERGIES: Allergies  Allergen Reactions  . Aricept  [Donepezil Hcl] Other (See Comments)    Unknown   . Simvastatin Other (See Comments)    Unknown     HOME MEDICATIONS: Outpatient Prescriptions Prior to Visit  Medication Sig Dispense Refill  . ENSURE (ENSURE) Take 237 mLs by mouth. One can daily    . pantoprazole (PROTONIX) 40 MG tablet TAKE 1 TABLET DAILY TO REDUCE STOMACH ACID. 90 tablet 1  . pyrithione zinc (SELSUN BLUE DRY SCALP) 1 % shampoo Shampoo at least 3 times weekly 400 mL 12  . SALINE NASAL SPRAY NA Place into the nose. One spray in each nostril as needed    . sertraline (ZOLOFT) 50 MG tablet TAKE ONE TABLET DAILY TO HELP WITH NERVES. 90 tablet 1  . Dextromethorphan-Menthol (DELSYM COUGH RELIEF MT) Take 30 mg by mouth every 12 (twelve) hours as needed (30 mg/52ml for cough).    Marland Kitchen aspirin 81 MG tablet Take 1 tablet (81 mg total) by mouth daily. Start on 05/06/14 30 tablet   . cetirizine (ZYRTEC) 5 MG tablet Take 5 mg by mouth daily.    . Cholecalciferol (VITAMIN D) 2000 UNITS tablet Take 1,000 Units by mouth daily.     Marland Kitchen galantamine (RAZADYNE) 8 MG tablet TAKE 1 TABLET TWICE DAILY. (Patient taking differently: take two tablets every morning) 180 tablet 1  . Glucosamine-Chondroitin (GLUCOSAMINE CHONDR COMPLEX PO) Take 1 capsule by mouth daily.     No facility-administered medications prior to visit.    PAST MEDICAL HISTORY: Past Medical History  Diagnosis Date  . Cardiac pacemaker in situ     a.fib w/slow ventricular response  . Atrial fibrillation   . Myalgia and myositis, unspecified   .  Unspecified essential hypertension   . Other and unspecified hyperlipidemia   . Sinoatrial node dysfunction   . Acute posthemorrhagic anemia   . Depression   . Gastric ulcer   . Dementia   . Vitamin D deficiency   . Gait disorder   . Chronic kidney disease, stage II (mild)   . Acute gastric ulcer with hemorrhage, without mention of obstruction   . Other malaise and fatigue   . Edema   . Personal history of fall   . Hemorrhage of  gastrointestinal tract, unspecified 10/13/11  . Muscle weakness (generalized)   . Anemia, unspecified   . Other specified disease of sebaceous glands     xerosis cutis  . Unspecified pruritic disorder   . Unspecified urinary incontinence 01/01/10  . Unspecified hearing loss   . Peripheral vascular disease, unspecified   . Syncope and collapse 08/08/2008  . Abrasion of face   . Cough   . Type II or unspecified type diabetes mellitus with renal manifestations, not stated as uncontrolled   . Subdural hematoma 04/19/2014    04/19/14 CT head and cervical spine: MPRESSION: 1. There is new isodense subdural left hemispheric collection which measures 9 mm maximum thickness. Only small amount of subacute blood products anteriorly see axial image 20. No mass effect or midline shift. Stable atrophy and chronic white matter disease. No definite acute cortical infarction. No mass lesion is noted on this unenhanced scan 2. No cervical spine acute fracture or subluxation. Degenerative changes as described above.   . Fall 10/07/2011  . Ecchymosis 04/04/2014  . CKD (chronic kidney disease) stage 4, GFR 15-29 ml/min 04/20/2014  . Chronic atrial fibrillation     PAST SURGICAL HISTORY: Past Surgical History  Procedure Laterality Date  . No past surgeries    . Esophagogastroduodenoscopy  10/08/2011    Procedure: ESOPHAGOGASTRODUODENOSCOPY (EGD);  Surgeon: Willis Modena, MD;  Location: [facility];  Service: Endoscopy;  Laterality: N/A;  . Pacemaker generator change  08/05/07    [facility]  . Pacemaker insertion  2003  . Appendectomy  1926  . Cholecystectomy  1956  . Tonsillectomy  1926    FAMILY HISTORY: Family History  Problem Relation Age of Onset  . Heart attack Mother     had a couple  . Heart failure Mother   . Emphysema Father   . Cancer Sister     lung    SOCIAL HISTORY:  History   Social History  . Marital Status: Married    Spouse Name: N/A  . Number of Children: 5  . Years of  Education: N/A   Occupational History  . retired     Social History Main Topics  . Smoking status: Former Smoker -- 5 years    Types: Pipe  . Smokeless tobacco: Never Used  . Alcohol Use: No  . Drug Use: No  . Sexual Activity: No   Other Topics Concern  . Not on file   Social History Narrative   Retired. Walks 5 miles for exercise. Does not use ethanol.    Lives at [facility]    Was in [place]   Has Pacemaker   Living Will; DNR     PHYSICAL EXAM  Filed Vitals:   06/20/14 0950  BP: 111/50  Pulse: 65  Height:  (1.753 m)    There is no weight on file to calculate BMI.  No exam data present  MMSE - Mini Mental State Exam 11/22/2013 11/22/2013 04/14/2012  Orientation  to time 0 0 0  Orientation to Place 2 2 5   Registration 3 3 3   Attention/ Calculation 4 4 5   Recall 2 2 1   Language- name 2 objects 2 2 2   Language- repeat 1 1 1   Language- follow 3 step command 3 3 2   Language- read & follow direction 0 0 1  Write a sentence 1 1 1   Copy design 0 0 0  Total score 18 18 21     GENERAL EXAM: Patient is in no distress; well developed, nourished and groomed; neck is supple  CARDIOVASCULAR: Regular rate and rhythm, no murmurs, no carotid bruits  NEUROLOGIC: MENTAL STATUS: DEEPLY ASLEEP; MOANS TO PHYSICAL STIM; WITHDRAWS ARMS AND LEGS TO STIM; CRANIAL NERVE: pupils equal and reactive to light, facial symmetric MOTOR/SENSORY: normal bulk and tone, WITHDRAWS TO STIM REFLEXES: BUE TRACE; BLE 0 GAIT/STATION: IN WHEELCHAIR; DEEPLY ASLEEP    DIAGNOSTIC DATA (LABS, IMAGING, TESTING) - I reviewed patient records, labs, notes, testing and imaging myself where available.  Lab Results  Component Value Date   WBC 8.0 06/14/2014   HGB 9.1* 06/14/2014   HCT 27* 06/14/2014   MCV 94.0 04/20/2014   PLT 155 06/14/2014      Component Value Date/Time   NA 142 06/12/2014   NA 138 04/21/2014 0541   K 4.2 06/12/2014   CL 109 04/21/2014 0541   CO2 21 04/21/2014  0541   GLUCOSE 118* 04/21/2014 0541   BUN 42* 06/12/2014   BUN 29* 04/21/2014 0541   CREATININE 2.2* 06/12/2014   CREATININE 2.32* 04/21/2014 0541   CALCIUM 8.7 04/21/2014 0541   PROT 6.9 04/19/2014 0917   ALBUMIN 3.8 04/19/2014 0917   AST 15 06/12/2014   ALT 13 06/12/2014   ALKPHOS 76 06/12/2014   BILITOT 1.4* 04/19/2014 0917   GFRNONAA 22* 04/21/2014 0541   GFRAA 26* 04/21/2014 0541   Lab Results  Component Value Date   CHOL 131 03/27/2014   HDL 39 03/27/2014   LDLCALC 73 03/27/2014   TRIG 97 03/27/2014   Lab Results  Component Value Date   HGBA1C 6.3* 03/27/2014   Lab Results  Component Value Date   VITAMINB12 275 09/22/2009   Lab Results  Component Value Date   TSH 1.217 04/21/2014    04/19/14 TTE  - Left ventricle: Moderate basal septal hypertrophy mid and basal inferior wall hypokinesis. The cavity size was mildly dilated. The estimated ejection fraction was 40%. - Aortic valve: Calcified non coronary cusp. - Mitral valve: There was moderate regurgitation. - Left atrium: The atrium was moderately dilated. - Right ventricle: Catheter in RV - Atrial septum: No defect or patent foramen ovale was identified. - Tricuspid valve: There was moderate regurgitation. - Pulmonary arteries: PA peak pressure: 54 mm Hg (S).  04/19/14 CT HEAD / CERVICAL SPINE [I reviewed images myself and agree with interpretation. -VRP]  1. There is new isodense subdural left hemispheric collection which measures 9 mm maximum thickness. Only small amount of subacute blood products anteriorly see axial image 20. No mass effect or midline shift. Stable atrophy and chronic white matter disease. No definite acute cortical infarction. No mass lesion is noted on this unenhanced scan  2. No cervical spine acute fracture or subluxation. Degenerative changes as described above. These results were called by telephone at the time of interpretation on 04/19/2014 at 10:15 am to Dr. Bethann Berkshire, who verbally  acknowledged these results.     ASSESSMENT AND PLAN  81 y.o. year old male here  with hypertension, diabetes, heart disease, sick sinus syndrome status post pacemaker, atrial fibrillation, depression, anxiety, advanced dementia, here for evaluation of subdural hematoma from April 2016, likely posttraumatic. Patient gradually improved to baseline, then has declined over last 1-2 months. He continues to have fluctuation cognitive status, likely related to underlying dementia. Given patient's advanced age, dementia and other medical conditions he would not be a good surgical candidate. I would favor conservative management and monitor patient. I do not think he needs another CT scan at this point, because this would not change his management.   Dx:  Dementia, without behavioral disturbance  Traumatic subdural hemorrhage without loss of consciousness    PLAN: - palliative care approach given his age, dementia and medical conditions --> to be managed by [facility] - diagnosis, prognosis and treatment options reviewed with caregiver (in person) and nurse (via phone) at [facility]; advanced care planning also reviewed - no further neurology follow up advised; follow up with [facility]  Return for return to PCP.  I spent 25 minutes of face to face time with patient. Greater than 50% of time was spent in counseling and coordination of care with patient, regarding dementia diagnosis and prognosis and palliative care. I called nurse Burnice Logan at facility as well during the evaluation.     Suanne Marker, MD 06/20/2014, 10:14 AM Certified in Neurology, Neurophysiology and Neuroimaging  [facility] [address] [place], [place] [address] [phone]

## 2014-06-20 NOTE — Patient Instructions (Signed)
Recommend palliative care approach.

## 2014-06-27 ENCOUNTER — Non-Acute Institutional Stay (SKILLED_NURSING_FACILITY): Payer: Medicare Other | Admitting: Nurse Practitioner

## 2014-06-27 ENCOUNTER — Encounter: Payer: Self-pay | Admitting: Nurse Practitioner

## 2014-06-27 DIAGNOSIS — K219 Gastro-esophageal reflux disease without esophagitis: Secondary | ICD-10-CM

## 2014-06-27 DIAGNOSIS — G4701 Insomnia due to medical condition: Secondary | ICD-10-CM

## 2014-06-27 DIAGNOSIS — I10 Essential (primary) hypertension: Secondary | ICD-10-CM | POA: Diagnosis not present

## 2014-06-27 DIAGNOSIS — I482 Chronic atrial fibrillation, unspecified: Secondary | ICD-10-CM

## 2014-06-27 DIAGNOSIS — D500 Iron deficiency anemia secondary to blood loss (chronic): Secondary | ICD-10-CM

## 2014-06-27 DIAGNOSIS — F329 Major depressive disorder, single episode, unspecified: Secondary | ICD-10-CM

## 2014-06-27 DIAGNOSIS — L03818 Cellulitis of other sites: Secondary | ICD-10-CM

## 2014-06-27 DIAGNOSIS — F0391 Unspecified dementia with behavioral disturbance: Secondary | ICD-10-CM

## 2014-06-27 DIAGNOSIS — L0390 Cellulitis, unspecified: Secondary | ICD-10-CM | POA: Insufficient documentation

## 2014-06-27 DIAGNOSIS — F32A Depression, unspecified: Secondary | ICD-10-CM

## 2014-06-27 NOTE — Assessment & Plan Note (Signed)
06/13/14 off Galantamine. Prn Lorazepam  06/27/14 agitation at night, ativan 0.5mg  2pm and q8hr prn used almost 2x/day with little effect, will add Ambien 5mg  hs for sleep, continue Sertraline 50mg  for now.

## 2014-06-27 NOTE — Assessment & Plan Note (Signed)
Controlled. Off med   

## 2014-06-27 NOTE — Assessment & Plan Note (Signed)
Right elbow developed from previous abrasion, will apply Bactroban oint bid to affected area until healed, Septra DS bid x 7 days, protective dressing.

## 2014-06-27 NOTE — Progress Notes (Signed)
Patient ID: Patrick Murray, male   DOB: May 10, 1917, 81 y.o.   MRN: [id number]   Code Status: DNR  Allergies  Allergen Reactions  . Aricept [Donepezil Hcl] Other (See Comments)    Unknown   . Simvastatin Other (See Comments)    Unknown     Chief Complaint  Patient presents with  . Medical Management of Chronic Issues  . Acute Visit    agitation.     HPI: Patient is a 81 y.o. male seen in the SNF at [facility] today for evaluation of agitation, right elbow abrasion,  and chronic medical conditions.      Hospitalized from 04/19/2014--04/22/2014 for evaluation of s/p fall, subdural hematoma, and CKD   -pt found on floor by caregiver -04/19/2014 echocardiogram shows Inf Wall HK which appears to be new from his previous echo -consulted cardiology to also assist in clarifying if he is having long runs or tachycardia -placed on tele--not concerning dysrhythmias -cardiology interrogated PPM--> no concerning dysrhythmia -hold ASA for now -The patient was orthostatic which possibly contributed to his fall  04/19/2014. CT of the brain in the emergency department revealed a small subdural hematoma. Neurosurgery was consulted and did not feel that the patient needed any interventions at this time. There was no mass effect or effacement. Since admission, the patient has been more alert but remains a little more confused than baseline according to his caregiver. Fortunately, the patient's mental status continued to improve throughout the hospitalization. At the time of discharge, the patient was near his baseline mental status.       Problem List Items Addressed This Visit    Essential hypertension (Chronic)    Controlled. Off med.        Dementia (Chronic)    06/13/14 off Galantamine. Prn Lorazepam  06/27/14 agitation at night, ativan 0.5mg  2pm and q8hr prn used almost 2x/day with little effect, will add Ambien  hs for sleep, continue Sertraline  for now.       Chronic  atrial fibrillation    Heart rate is in control. Off ASA  daily R>B        Depression    Continue Sertraline  for now-may GDR       GERD (gastroesophageal reflux disease)    Continue Pantoprazole  daily.        Anemia due to chronic blood loss    06/11/14 Hgb 8.7 06/13/14 Hgb 8.2, Iron 74, B12 399-pending FOBT stoolsx 3. Adding Iron  daily. 06/14/14 Hgb 9.1      Cellulitis - Primary    Right elbow developed from previous abrasion, will apply Bactroban oint bid to affected area until healed, Septra DS bid x 7 days, protective dressing.       Insomnia due to medical condition    06/27/14 Ambien  qhs         Review of Systems:  Review of Systems  Constitutional: Negative for fever, chills, diaphoresis, activity change, appetite change, fatigue and unexpected weight change.  HENT: Positive for hearing loss. Negative for congestion, ear pain, facial swelling and nosebleeds.   Eyes: Negative.        Corrective lenses.  Respiratory: Negative for chest tightness and shortness of breath. Cough: dry.   Cardiovascular: Negative for chest pain, palpitations and leg swelling.  Gastrointestinal: Negative for abdominal pain and abdominal distention.       Cough with eating and drinking.  Endocrine: Negative for cold intolerance, heat intolerance, polydipsia, polyphagia and polyuria.  Diabetic.  Genitourinary: Negative.   Musculoskeletal: Positive for myalgias, arthralgias and gait problem. Negative for neck pain and neck stiffness.  Skin:       Right elbow cellulitis developed from previous abrasion  Neurological: Positive for dizziness and weakness. Negative for tremors, seizures, syncope, numbness and headaches.       Balance disturbance. Dementia.  Hematological: Negative.   Psychiatric/Behavioral: Positive for behavioral problems, confusion and agitation.     Past Medical History  Diagnosis Date  . Cardiac pacemaker in situ     a.fib w/slow  ventricular response  . Atrial fibrillation   . Myalgia and myositis, unspecified   . Unspecified essential hypertension   . Other and unspecified hyperlipidemia   . Sinoatrial node dysfunction   . Acute posthemorrhagic anemia   . Depression   . Gastric ulcer   . Dementia   . Vitamin D deficiency   . Gait disorder   . Chronic kidney disease, stage II (mild)   . Acute gastric ulcer with hemorrhage, without mention of obstruction   . Other malaise and fatigue   . Edema   . Personal history of fall   . Hemorrhage of gastrointestinal tract, unspecified 10/13/11  . Muscle weakness (generalized)   . Anemia, unspecified   . Other specified disease of sebaceous glands     xerosis cutis  . Unspecified pruritic disorder   . Unspecified urinary incontinence 01/01/10  . Unspecified hearing loss   . Peripheral vascular disease, unspecified   . Syncope and collapse 08/08/2008  . Abrasion of face   . Cough   . Type II or unspecified type diabetes mellitus with renal manifestations, not stated as uncontrolled   . Subdural hematoma 04/19/2014    04/19/14 CT head and cervical spine: MPRESSION: 1. There is new isodense subdural left hemispheric collection which measures 9 mm maximum thickness. Only small amount of subacute blood products anteriorly see axial image 20. No mass effect or midline shift. Stable atrophy and chronic white matter disease. No definite acute cortical infarction. No mass lesion is noted on this unenhanced scan 2. No cervical spine acute fracture or subluxation. Degenerative changes as described above.   . Fall 10/07/2011  . Ecchymosis 04/04/2014  . CKD (chronic kidney disease) stage 4, GFR 15-29 ml/min 04/20/2014  . Chronic atrial fibrillation    Past Surgical History  Procedure Laterality Date  . No past surgeries    . Esophagogastroduodenoscopy  10/08/2011    Procedure: ESOPHAGOGASTRODUODENOSCOPY (EGD);  Surgeon: Willis Modena, MD;  Location: [facility];  Service: Endoscopy;   Laterality: N/A;  . Pacemaker generator change  08/05/07    [facility]  . Pacemaker insertion  2003  . Appendectomy  1926  . Cholecystectomy  1956  . Tonsillectomy  1926   Social History:   reports that he has quit smoking. His smoking use included Pipe. He has never used smokeless tobacco. He reports that he does not drink alcohol or use illicit drugs.  Family History  Problem Relation Age of Onset  . Heart attack Mother     had a couple  . Heart failure Mother   . Emphysema Father   . Cancer Sister     lung    Medications: Patient's Medications  New Prescriptions   No medications on file  Previous Medications   DEXTROMETHORPHAN-MENTHOL (DELSYM COUGH RELIEF MT)    Take 30 mg by mouth every 12 (twelve) hours as needed (30 mg/27ml for cough).   ENSURE (ENSURE)    Take  237 mLs by mouth. One can daily   FERROUS SULFATE 325 (65 FE) MG TABLET    Take 325 mg by mouth daily with breakfast.   LORAZEPAM (ATIVAN) 0.5 MG TABLET    Take 0.5 mg by mouth every 8 (eight) hours.   PANTOPRAZOLE (PROTONIX) 40 MG TABLET    TAKE 1 TABLET DAILY TO REDUCE STOMACH ACID.   PYRITHIONE ZINC (SELSUN BLUE DRY SCALP) 1 % SHAMPOO    Shampoo at least 3 times weekly   SALINE NASAL SPRAY NA    Place into the nose. One spray in each nostril as needed   SERTRALINE (ZOLOFT) 50 MG TABLET    TAKE ONE TABLET DAILY TO HELP WITH NERVES.  Modified Medications   No medications on file  Discontinued Medications   No medications on file     Physical Exam: Physical Exam  Constitutional:  Frail elderly male  HENT:  Bilateral hearing aids. Loss of hearing.l. No bleeding or scabs. Tympanic membranes have some scarring bilaterally but no acute injury.  Eyes: Conjunctivae are normal. Pupils are equal, round, and reactive to light.  Neck: Normal range of motion. Neck supple. No JVD present. No tracheal deviation present. No thyromegaly present.  Deviated spine of the nose.  Cardiovascular: Normal rate and intact distal  pulses.  Exam reveals no gallop and no friction rub.   No murmur heard. Atrial fib Diminished DP and PT bilaterally  Pulmonary/Chest: Effort normal. No respiratory distress. He has no wheezes. He has no rales.  Abdominal: Soft. Bowel sounds are normal. He exhibits no distension and no mass. There is no tenderness.  Musculoskeletal: Normal range of motion. He exhibits no edema or tenderness.  Poor balance.  Lymphadenopathy:    He has no cervical adenopathy.  Neurological: He is alert. No cranial nerve deficit. Coordination abnormal.  11/22/13 MMSE 17/30. Failed clock drawing. Diminished sensation to vibration and to monofilament testing bilaterally.  Skin: Skin is warm and dry. No rash noted. No erythema. No pallor.  Healed ulcer of the left foot at the proximal 5th metatarsal head. Residual scaly area. Right elbow cellulitis developed from previous abrasion   Psychiatric: He has a normal mood and affect.  Cheerful. Forgetful.   Filed Vitals:   06/27/14 1138  BP: 130/70  Pulse: 60  Temp: 97.6 F (36.4 C)  TempSrc: Tympanic  Resp: 16      Labs reviewed: Basic Metabolic Panel:  Recent Labs  54/09/81  04/19/14 0917 04/20/14 0515 04/21/14 0541 04/27/14 06/12/14  NA 137  < > 138 136 138 141 142  K 4.4  --  4.5 4.3 4.0 4.1 4.2  CL  --   --  106 107 109  --   --   CO2  --   --  23 20 21   --   --   GLUCOSE  --   --  133* 130* 118*  --   --   BUN 31*  < > 29* 28* 29* 25* 42*  CREATININE 2.4*  < > 2.38* 2.28* 2.32* 2.1* 2.2*  CALCIUM  --   --  8.8 8.8 8.7  --   --   TSH 1.48  --   --   --  1.217  --   --   < > = values in this interval not displayed. Liver Function Tests:  Recent Labs  04/10/14 04/19/14 0917 06/12/14  AST 12* 17 15  ALT 10 13 13   ALKPHOS 106 106 76  BILITOT  --  1.4*  --   PROT  --  6.9  --   ALBUMIN  --  3.8  --    No results for input(s): LIPASE, AMYLASE in the last 8760 hours. No results for input(s): AMMONIA in the last 8760  hours. CBC:  Recent Labs  04/19/14 0917 04/20/14 0515  06/12/14 06/13/14 06/14/14  WBC 6.7 6.7  < > 5.1 5.1 8.0  NEUTROABS 5.3  --   --   --   --   --   HGB 11.6* 10.9*  < > 8.2* 8.2* 9.1*  HCT 35.6* 33.1*  < > 25* 25* 27*  MCV 94.7 94.0  --   --   --   --   PLT 140* 131*  < > 9* 140* 155  < > = values in this interval not displayed. Lipid Panel:  Recent Labs  11/14/13 03/27/14  CHOL 162 131  HDL 39 39  LDLCALC 97 73  TRIG 128 97    Past Procedures:  04/19/14 CT head and cervical spine wo contrast:  MPRESSION: 1. There is new isodense subdural left hemispheric collection which measures 9 mm maximum thickness. Only small amount of subacute blood products anteriorly see axial image 20. No mass effect or midline shift. Stable atrophy and chronic white matter disease. No definite acute cortical infarction. No mass lesion is noted on this unenhanced scan 2. No cervical spine acute fracture or subluxation. Degenerative changes as described above.  06/07/14 X-ray R+L hip: no acute fracture or dislocation. Moderate osteoarthritis.   Assessment/Plan Cellulitis Right elbow developed from previous abrasion, will apply Bactroban oint bid to affected area until healed, Septra DS bid x 7 days, protective dressing.   Essential hypertension Controlled. Off med.    Dementia 06/13/14 off Galantamine. Prn Lorazepam  06/27/14 agitation at night, ativan 0.5mg  2pm and q8hr prn used almost 2x/day with little effect, will add Ambien 5mg  hs for sleep, continue Sertraline 50mg  for now.   Anemia due to chronic blood loss 06/11/14 Hgb 8.7 06/13/14 Hgb 8.2, Iron 74, B12 399-pending FOBT stoolsx 3. Adding Iron 325mg  daily. 06/14/14 Hgb 9.1  Depression Continue Sertraline 50mg  for now-may GDR   GERD (gastroesophageal reflux disease) Continue Pantoprazole 40mg  daily.    Chronic atrial fibrillation Heart rate is in control. Off ASA 81mg  daily R>B    Insomnia due to medical condition 06/27/14  Ambien 5mg  qhs    Family/ Staff Communication: observe the patient.   Goals of Care: SNF  Labs/tests ordered: none

## 2014-06-27 NOTE — Assessment & Plan Note (Signed)
06/11/14 Hgb 8.7 06/13/14 Hgb 8.2, Iron 74, B12 399-pending FOBT stoolsx 3. Adding Iron 325mg  daily. 06/14/14 Hgb 9.1

## 2014-06-27 NOTE — Assessment & Plan Note (Signed)
Heart rate is in control. Off ASA 81mg  daily R>B

## 2014-06-27 NOTE — Assessment & Plan Note (Signed)
06/27/14 Ambien 5mg  qhs

## 2014-06-27 NOTE — Assessment & Plan Note (Signed)
Continue Pantoprazole 40 mg daily

## 2014-06-27 NOTE — Assessment & Plan Note (Signed)
Continue Sertraline 50mg  for now-may GDR

## 2014-07-04 ENCOUNTER — Encounter: Payer: Self-pay | Admitting: *Deleted

## 2014-08-01 ENCOUNTER — Encounter: Payer: Self-pay | Admitting: *Deleted

## 2014-08-01 ENCOUNTER — Encounter: Payer: Medicare Other | Admitting: Internal Medicine

## 2014-09-01 ENCOUNTER — Encounter: Payer: Self-pay | Admitting: *Deleted

## 2014-09-05 ENCOUNTER — Non-Acute Institutional Stay (SKILLED_NURSING_FACILITY): Payer: Medicare Other | Admitting: Nurse Practitioner

## 2014-09-05 ENCOUNTER — Encounter: Payer: Self-pay | Admitting: Nurse Practitioner

## 2014-09-05 DIAGNOSIS — I482 Chronic atrial fibrillation, unspecified: Secondary | ICD-10-CM

## 2014-09-05 DIAGNOSIS — S065X9A Traumatic subdural hemorrhage with loss of consciousness of unspecified duration, initial encounter: Secondary | ICD-10-CM

## 2014-09-05 DIAGNOSIS — F329 Major depressive disorder, single episode, unspecified: Secondary | ICD-10-CM | POA: Diagnosis not present

## 2014-09-05 DIAGNOSIS — F0390 Unspecified dementia without behavioral disturbance: Secondary | ICD-10-CM

## 2014-09-05 DIAGNOSIS — S065XAA Traumatic subdural hemorrhage with loss of consciousness status unknown, initial encounter: Secondary | ICD-10-CM

## 2014-09-05 DIAGNOSIS — I6200 Nontraumatic subdural hemorrhage, unspecified: Secondary | ICD-10-CM | POA: Diagnosis not present

## 2014-09-05 DIAGNOSIS — I10 Essential (primary) hypertension: Secondary | ICD-10-CM

## 2014-09-05 DIAGNOSIS — F32A Depression, unspecified: Secondary | ICD-10-CM

## 2014-09-05 NOTE — Assessment & Plan Note (Signed)
Controlled. Off med   

## 2014-09-05 NOTE — Assessment & Plan Note (Signed)
04/22/14 SLUMS 7/30 06/09/14 dc Galantamine 08/28/14 MMSE 18/30  09/05/14 SNF for care needs

## 2014-09-05 NOTE — Progress Notes (Signed)
Patient ID: Patrick Murray, male   DOB: 04-May-1917, 81 y.o.   MRN: [id number]  Location:  SNF [facility] Provider:  Chipper Oman NP  Code Status:  DNR Goals of care: Advanced Directive information    Chief Complaint  Patient presents with  . Medical Management of Chronic Issues     HPI: Patient is a 81 y.o. male seen in the SNF at [facility] today for evaluation of HTN, normalized wo med, dementia has progressed rapidly since the subdural hematoma 04/2014, no new focal neurological symptoms noted since his head trauma, chronic Afib heart rate is in control wo medication, mood is stable except occasionally agitated due to lack understanding of his surroundings.   Review of Systems:  Review of Systems  Constitutional: Negative for fever, chills and diaphoresis.  HENT: Positive for hearing loss. Negative for congestion, ear pain and nosebleeds.   Eyes: Negative.        Corrective lenses.  Respiratory: Negative for shortness of breath. Cough: dry.   Cardiovascular: Negative for chest pain, palpitations and leg swelling.  Gastrointestinal: Negative for abdominal pain.       Takes longer to eat, swallow.   Genitourinary: Negative.   Musculoskeletal: Positive for myalgias. Negative for neck pain.  Neurological: Positive for weakness. Negative for dizziness, tremors, seizures and headaches.       Balance disturbance. Dementia.  Endo/Heme/Allergies: Negative for polydipsia.  Psychiatric/Behavioral: Positive for memory loss. Negative for suicidal ideas and hallucinations. The patient does not have insomnia.     Past Medical History  Diagnosis Date  . Cardiac pacemaker in situ     a.fib w/slow ventricular response  . Atrial fibrillation   . Myalgia and myositis, unspecified   . Unspecified essential hypertension   . Other and unspecified hyperlipidemia   . Sinoatrial node dysfunction   . Acute posthemorrhagic anemia   . Depression   . Gastric ulcer   . Dementia   . Vitamin D  deficiency   . Gait disorder   . Chronic kidney disease, stage II (mild)   . Acute gastric ulcer with hemorrhage, without mention of obstruction   . Other malaise and fatigue   . Edema   . Personal history of fall   . Hemorrhage of gastrointestinal tract, unspecified 10/13/11  . Muscle weakness (generalized)   . Anemia, unspecified   . Other specified disease of sebaceous glands     xerosis cutis  . Unspecified pruritic disorder   . Unspecified urinary incontinence 01/01/10  . Unspecified hearing loss   . Peripheral vascular disease, unspecified   . Syncope and collapse 08/08/2008  . Abrasion of face   . Cough   . Type II or unspecified type diabetes mellitus with renal manifestations, not stated as uncontrolled   . Subdural hematoma 04/19/2014    04/19/14 CT head and cervical spine: MPRESSION: 1. There is new isodense subdural left hemispheric collection which measures 9 mm maximum thickness. Only small amount of subacute blood products anteriorly see axial image 20. No mass effect or midline shift. Stable atrophy and chronic white matter disease. No definite acute cortical infarction. No mass lesion is noted on this unenhanced scan 2. No cervical spine acute fracture or subluxation. Degenerative changes as described above.   . Fall 10/07/2011  . Ecchymosis 04/04/2014  . CKD (chronic kidney disease) stage 4, GFR 15-29 ml/min 04/20/2014  . Chronic atrial fibrillation     Patient Active Problem List   Diagnosis Date Noted  . Cellulitis 06/27/2014  .  Insomnia due to medical condition 06/27/2014  . Traumatic subdural hemorrhage without loss of consciousness 06/20/2014  . Anemia due to chronic blood loss 06/13/2014  . Transient unconsciousness 06/09/2014  . GERD (gastroesophageal reflux disease) 06/09/2014  . Open wound of right lower leg 05/12/2014  . UTI (urinary tract infection) 05/02/2014  . Depression 04/25/2014  . Chronic atrial fibrillation   . CKD (chronic kidney disease) stage 4,  GFR 15-29 ml/min 04/20/2014  . Subdural hematoma 04/19/2014  . Hearing loss 04/04/2014  . Ecchymosis 04/04/2014  . Pruritus 04/19/2013  . Diabetic nephropathy associated with type 2 diabetes mellitus 11/16/2012  . Dysphagia, pharyngoesophageal phase 08/24/2012  . Diabetes mellitus with neurological manifestation 05/25/2012  . Dementia   . History of gastric ulcer 10/07/2011  . Fall 10/07/2011  . Gait instability 10/07/2011  . Anemia 10/07/2011  . Cardiac pacemaker - [employer] 2010   . Hyperlipemia 12/25/2008  . Essential hypertension 12/25/2008  . SICK SINUS SYNDROME 12/25/2008    Allergies  Allergen Reactions  . Aricept [Donepezil Hcl] Other (See Comments)    Unknown   . Simvastatin Other (See Comments)    Unknown     Medications: Patient's Medications  New Prescriptions   No medications on file  Previous Medications   DEXTROMETHORPHAN-MENTHOL (DELSYM COUGH RELIEF MT)    Take 30 mg by mouth every 12 (twelve) hours as needed (30 mg/58ml for cough).   ENSURE (ENSURE)    Take 237 mLs by mouth. One can daily   FERROUS SULFATE 325 (65 FE) MG TABLET    Take 325 mg by mouth daily with breakfast.   LORAZEPAM (ATIVAN) 0.5 MG TABLET    Take 0.5 mg by mouth every 8 (eight) hours.   PANTOPRAZOLE (PROTONIX) 40 MG TABLET    TAKE 1 TABLET DAILY TO REDUCE STOMACH ACID.   PYRITHIONE ZINC (SELSUN BLUE DRY SCALP) 1 % SHAMPOO    Shampoo at least 3 times weekly   SALINE NASAL SPRAY NA    Place into the nose. One spray in each nostril as needed   SERTRALINE (ZOLOFT) 50 MG TABLET    TAKE ONE TABLET DAILY TO HELP WITH NERVES.  Modified Medications   No medications on file  Discontinued Medications   No medications on file    Physical Exam: Filed Vitals:   09/05/14 1507  BP: 122/56  Pulse: 80  Temp: 97.4 F (36.3 C)  TempSrc: Tympanic  Resp: 18   There is no weight on file to calculate BMI.  Physical Exam  Constitutional:  Frail elderly male  HENT:  Bilateral hearing aids.    Eyes: Conjunctivae are normal. Pupils are equal, round, and reactive to light.  Neck: Normal range of motion. Neck supple. No JVD present. No tracheal deviation present. No thyromegaly present.  Deviated spine of the nose.  Cardiovascular: Normal rate and intact distal pulses.  Exam reveals no gallop and no friction rub.   No murmur heard. Atrial fib Diminished DP and PT bilaterally  Pulmonary/Chest: Effort normal. No respiratory distress. He has no wheezes. He has no rales.  Abdominal: Soft. Bowel sounds are normal. He exhibits no distension and no mass. There is no tenderness.  Musculoskeletal: Normal range of motion. He exhibits no edema or tenderness.  W/c for mobility  Lymphadenopathy:    He has no cervical adenopathy.  Neurological: He is alert. No cranial nerve deficit. Coordination abnormal.  11/22/13 MMSE 17/30. Failed clock drawing.   Skin: Skin is warm and dry. No rash  noted. No erythema. No pallor.     Psychiatric: He has a normal mood and affect.  Cheerful. Forgetful.    Labs reviewed: Basic Metabolic Panel:  Recent Labs  16/10/96 0917 04/20/14 0515 04/21/14 0541 04/27/14 06/12/14  NA 138 136 138 141 142  K 4.5 4.3 4.0 4.1 4.2  CL 106 107 109  --   --   CO2 23 20 21   --   --   GLUCOSE 133* 130* 118*  --   --   BUN 29* 28* 29* 25* 42*  CREATININE 2.38* 2.28* 2.32* 2.1* 2.2*  CALCIUM 8.8 8.8 8.7  --   --     Liver Function Tests:  Recent Labs  04/10/14 04/19/14 0917 06/12/14  AST 12* 17 15  ALT 10 13 13   ALKPHOS 106 106 76  BILITOT  --  1.4*  --   PROT  --  6.9  --   ALBUMIN  --  3.8  --     CBC:  Recent Labs  04/19/14 0917 04/20/14 0515  06/12/14 06/13/14 06/14/14  WBC 6.7 6.7  < > 5.1 5.1 8.0  NEUTROABS 5.3  --   --   --   --   --   HGB 11.6* 10.9*  < > 8.2* 8.2* 9.1*  HCT 35.6* 33.1*  < > 25* 25* 27*  MCV 94.7 94.0  --   --   --   --   PLT 140* 131*  < > 9* 140* 155  < > = values in this interval not displayed.  Lab Results    Component Value Date   TSH 1.217 04/21/2014   Lab Results  Component Value Date   HGBA1C 6.3* 03/27/2014   Lab Results  Component Value Date   CHOL 131 03/27/2014   HDL 39 03/27/2014   LDLCALC 73 03/27/2014   TRIG 97 03/27/2014    Significant Diagnostic Results since last visit: none  Patient Care Team: Kimber Relic, MD as PCP - General (Internal Medicine) [facility] Thurmon Fair, MD as Consulting Physician (Cardiology) Man Mast X, NP as Nurse Practitioner (Nurse Practitioner)  Assessment/Plan Problem List Items Addressed This Visit    Essential hypertension - Primary (Chronic)    Controlled. Off med.       Dementia (Chronic)    04/22/14 SLUMS 7/30 06/09/14 dc Galantamine 08/28/14 MMSE 18/30  09/05/14 SNF for care needs      Subdural hematoma (Chronic)    4//2016 left hemispheric subdural hematoma 9mm, no mass effect or midline shift.  09/05/14 gradual decline in cognition and physical ability. SNF for care needs. No new focal neurological symptoms.       Chronic atrial fibrillation    Heart rate is in control.       Depression    Continue Sertraline 50mg           Family/ staff Communication: supportive care SNF  Labs/tests ordered: none  [facility] Mast NP Geriatrics [facility] [address], [place] [address] On Call:  [phone] & follow prompts after 5pm & weekends Office Phone:  [phone] Office Fax:  [phone]

## 2014-09-05 NOTE — Assessment & Plan Note (Signed)
4//2016 left hemispheric subdural hematoma 9mm, no mass effect or midline shift.  09/05/14 gradual decline in cognition and physical ability. SNF for care needs. No new focal neurological symptoms.

## 2014-09-05 NOTE — Assessment & Plan Note (Signed)
Continue Sertraline 50mg

## 2014-09-05 NOTE — Assessment & Plan Note (Signed)
Heart rate is in control.  

## 2014-09-18 ENCOUNTER — Encounter: Payer: Self-pay | Admitting: Internal Medicine

## 2014-09-18 ENCOUNTER — Non-Acute Institutional Stay (SKILLED_NURSING_FACILITY): Payer: Medicare Other | Admitting: Internal Medicine

## 2014-09-18 DIAGNOSIS — N184 Chronic kidney disease, stage 4 (severe): Secondary | ICD-10-CM

## 2014-09-18 DIAGNOSIS — N189 Chronic kidney disease, unspecified: Secondary | ICD-10-CM | POA: Diagnosis not present

## 2014-09-18 DIAGNOSIS — R634 Abnormal weight loss: Secondary | ICD-10-CM | POA: Diagnosis not present

## 2014-09-18 DIAGNOSIS — D631 Anemia in chronic kidney disease: Secondary | ICD-10-CM | POA: Diagnosis not present

## 2014-09-18 DIAGNOSIS — I10 Essential (primary) hypertension: Secondary | ICD-10-CM | POA: Diagnosis not present

## 2014-09-18 DIAGNOSIS — G4701 Insomnia due to medical condition: Secondary | ICD-10-CM

## 2014-09-18 DIAGNOSIS — E0840 Diabetes mellitus due to underlying condition with diabetic neuropathy, unspecified: Secondary | ICD-10-CM | POA: Diagnosis not present

## 2014-09-18 DIAGNOSIS — F0390 Unspecified dementia without behavioral disturbance: Secondary | ICD-10-CM

## 2014-09-18 HISTORY — DX: Abnormal weight loss: R63.4

## 2014-09-18 NOTE — Progress Notes (Signed)
Patient ID: Patrick Murray, male   DOB: 1917-11-13, 81 y.o.   MRN: [id number]    [place] Room Number: N 19  Place of Service: SNF (31)     Allergies  Allergen Reactions  . Aricept [Donepezil Hcl] Other (See Comments)    Unknown   . Simvastatin Other (See Comments)    Unknown     Chief Complaint  Patient presents with  . Medical Management of Chronic Issues    HPI:  Patrick Murray continues to reside SNF level care because of total dependency on staff for all ADL. He is sleeping better at night according to nursing notes. He is drowsy at times during the day.  Loss of weight - insufficient caloric intake.  Anemia in chronic renal disease - stable  Essential hypertension - controlled  CKD (chronic kidney disease) stage 4, GFR 15-29 ml/min - follow-up lab needed  Dementia, without behavioral disturbance - unchanged  Diabetes mellitus due to underlying condition with diabetic neuropathy - follow-up lab needed  Insomnia due to medical condition - improved      Medications: Patient's Medications  New Prescriptions   No medications on file  Previous Medications   DEXTROMETHORPHAN-MENTHOL (DELSYM COUGH RELIEF MT)    Take 30 mg by mouth every 12 (twelve) hours as needed (30 mg/25m for cough).   ENSURE (ENSURE)    Take 237 mLs by mouth. One can daily   FERROUS SULFATE 325 (65 FE) MG TABLET    Take 325 mg by mouth daily with breakfast.   LORAZEPAM (ATIVAN) 0.5 MG TABLET    Take 0.5 mg by mouth every 8 (eight) hours.   PANTOPRAZOLE (PROTONIX) 40 MG TABLET    TAKE 1 TABLET DAILY TO REDUCE STOMACH ACID.   PYRITHIONE ZINC (SELSUN BLUE DRY SCALP) 1 % SHAMPOO    Shampoo at least 3 times weekly   SALINE NASAL SPRAY NA    Place into the nose. One spray in each nostril as needed   SERTRALINE (ZOLOFT) 50 MG TABLET    TAKE ONE TABLET DAILY TO HELP WITH NERVES.  Modified Medications   No medications on file  Discontinued Medications   No  medications on file     Review of Systems  Constitutional: Negative for fever, chills and diaphoresis.  HENT: Positive for hearing loss. Negative for congestion, ear pain and nosebleeds.   Eyes: Negative.        Corrective lenses.  Respiratory: Negative for shortness of breath. Cough: dry.   Cardiovascular: Negative for chest pain, palpitations and leg swelling.  Gastrointestinal: Negative for abdominal pain.       Takes longer to eat, swallow.   Endocrine: Negative for polydipsia.  Genitourinary: Negative.   Musculoskeletal: Positive for myalgias. Negative for neck pain.  Neurological: Positive for weakness. Negative for dizziness, tremors, seizures and headaches.       Balance disturbance. Dementia.  Psychiatric/Behavioral: Positive for sleep disturbance (Chronic insomnia with improvement on Rozerem). Negative for suicidal ideas, hallucinations and behavioral problems.    Filed Vitals:   09/18/14 1551  BP: 120/60  Pulse: 78  Temp: 97.1 F (36.2 C)  Resp: 18  Height: 5' 9"  (1.753 m)  Weight: 126 lb (57.153 kg)  SpO2: 98%   Body mass index is 18.6 kg/(m^2).  Physical Exam  Constitutional:  Frail elderly male  HENT:  Bilateral hearing aids.   Eyes: Conjunctivae are normal. Pupils are equal, round, and reactive to light.  Neck: Normal range of  motion. Neck supple. No JVD present. No tracheal deviation present. No thyromegaly present.  Deviated spine of the nose.  Cardiovascular: Normal rate and intact distal pulses.  Exam reveals no gallop and no friction rub.   No murmur heard. Atrial fib Diminished DP and PT bilaterally  Pulmonary/Chest: Effort normal. No respiratory distress. He has no wheezes. He has no rales.  Abdominal: Soft. Bowel sounds are normal. He exhibits no distension and no mass. There is no tenderness.  Musculoskeletal: Normal range of motion. He exhibits no edema or tenderness.  W/c for mobility  Lymphadenopathy:    He has no cervical adenopathy.    Neurological: He is alert. No cranial nerve deficit. Coordination abnormal.  11/22/13 MMSE 17/30. Failed clock drawing.  Skin: Skin is warm and dry. No rash noted. No erythema. No pallor.  Psychiatric: He has a normal mood and affect.  Cheerful. Forgetful.     Labs reviewed: Lab Summary Latest Ref Rng 06/14/2014 06/13/2014 06/12/2014 06/11/2014 04/27/2014 04/21/2014 04/20/2014  Hemoglobin 13.5 - 17.5 g/dL 9.1(A) 8.2(A) 8.2(A) 8.7(A) (None) (None) 10.9(L)  Hematocrit 41 - 53 % 27(A) 25(A) 25(A) 26(A) (None) (None) 33.1(L)  White count - 8.0 5.1 5.1 5.8 (None) (None) 6.7  Platelet count 150 - 399 K/L 155 140(A) 9(A) 141(A) (None) (None) 131(L)  Sodium 137 - 147 mmol/L (None) (None) 142 (None) 141 138 136  Potassium 3.4 - 5.3 mmol/L (None) (None) 4.2 (None) 4.1 4.0 4.3  Calcium 8.4 - 10.5 mg/dL (None) (None) (None) (None) (None) 8.7 8.8  Phosphorus - (None) (None) (None) (None) (None) (None) (None)  Creatinine 0.6 - 1.3 mg/dL (None) (None) 2.2(A) (None) 2.1(A) 2.32(H) 2.28(H)  AST 14 - 40 U/L (None) (None) 15 (None) (None) (None) (None)  Alk Phos 25 - 125 U/L (None) (None) 76 (None) (None) (None) (None)  Bilirubin 0.3 - 1.2 mg/dL (None) (None) (None) (None) (None) (None) (None)  Glucose - (None) (None) 117 (None) 107 118(H) 130(H)  Cholesterol - (None) (None) (None) (None) (None) (None) (None)  HDL cholesterol - (None) (None) (None) (None) (None) (None) (None)  Triglycerides - (None) (None) (None) (None) (None) (None) (None)  LDL Direct - (None) (None) (None) (None) (None) (None) (None)  LDL Calc - (None) (None) (None) (None) (None) (None) (None)  Total protein 6.0 - 8.3 g/dL (None) (None) (None) (None) (None) (None) (None)  Albumin 3.5 - 5.2 g/dL (None) (None) (None) (None) (None) (None) (None)   Lab Results  Component Value Date   TSH 1.217 04/21/2014   Lab Results  Component Value Date   BUN 42* 06/12/2014   Lab Results  Component Value Date   HGBA1C 6.3* 03/27/2014        Assessment/Plan  1. Loss of weight Continue to monitor  2. Anemia in chronic renal disease -CBC, future  3. Essential hypertension -CMP, future  4. CKD (chronic kidney disease) stage 4, GFR 15-29 ml/min -CMP, future  5. Dementia, without behavioral disturbance Continue to monitor  6. Diabetes mellitus due to underlying condition with diabetic neuropathy  -CMP, future  7. Insomnia due to medical condition Continue Rozerem

## 2014-09-21 LAB — BASIC METABOLIC PANEL
BUN: 63 mg/dL — AB (ref 4–21)
Creatinine: 2.4 mg/dL — AB (ref 0.6–1.3)
Glucose: 106 mg/dL
Potassium: 4.3 mmol/L (ref 3.4–5.3)
SODIUM: 147 mmol/L (ref 137–147)

## 2014-09-21 LAB — CBC AND DIFFERENTIAL
HCT: 33 % — AB (ref 41–53)
HEMOGLOBIN: 11.2 g/dL — AB (ref 13.5–17.5)
PLATELETS: 148 10*3/uL — AB (ref 150–399)
WBC: 8.5 10^3/mL

## 2014-09-21 LAB — HEPATIC FUNCTION PANEL
ALK PHOS: 81 U/L (ref 25–125)
ALT: 23 U/L (ref 10–40)
AST: 24 U/L (ref 14–40)
BILIRUBIN, TOTAL: 0.9 mg/dL

## 2014-09-21 LAB — TSH: TSH: 0.92 u[IU]/mL (ref 0.41–5.90)

## 2014-09-22 ENCOUNTER — Encounter: Payer: Self-pay | Admitting: Nurse Practitioner

## 2014-09-22 ENCOUNTER — Non-Acute Institutional Stay (SKILLED_NURSING_FACILITY): Payer: Medicare Other | Admitting: Nurse Practitioner

## 2014-09-22 DIAGNOSIS — F0390 Unspecified dementia without behavioral disturbance: Secondary | ICD-10-CM

## 2014-09-22 DIAGNOSIS — I10 Essential (primary) hypertension: Secondary | ICD-10-CM | POA: Diagnosis not present

## 2014-09-22 DIAGNOSIS — D649 Anemia, unspecified: Secondary | ICD-10-CM | POA: Diagnosis not present

## 2014-09-22 DIAGNOSIS — I482 Chronic atrial fibrillation, unspecified: Secondary | ICD-10-CM

## 2014-09-22 DIAGNOSIS — E860 Dehydration: Secondary | ICD-10-CM | POA: Insufficient documentation

## 2014-09-22 DIAGNOSIS — N184 Chronic kidney disease, stage 4 (severe): Secondary | ICD-10-CM

## 2014-09-22 NOTE — Assessment & Plan Note (Signed)
04/27/14 Bun/creat 25/2.14 06/12/14 Bun/creat 42/2.21 09/21/14 Bun/creat 63/2.45 Limited oral fluid intake contributory, encourage oral fluid intakes, update BMP

## 2014-09-22 NOTE — Assessment & Plan Note (Signed)
Heart rate is in control.  

## 2014-09-22 NOTE — Progress Notes (Signed)
Patient ID: Patrick Murray, male   DOB: [birth year], 81 y.o.   MRN: [id number]  Location:  SNF [facility] [place]:  Chipper Oman NP  Code Status:  DNR Goals of care: Advanced Directive information    Chief Complaint  Patient presents with  . Medical Management of Chronic Issues  . Acute Visit    dehydration     HPI: Patient is a 81 y.o. male seen in the SNF at [facility] today for evaluation of limited oral intake due to frequent refusal, no acute distress noted, no noted nausea, vomiting, or diarrhea.   Review of Systems:  Review of Systems  Constitutional: Negative for fever, chills and diaphoresis.  HENT: Positive for hearing loss. Negative for congestion, ear pain and nosebleeds.   Eyes: Negative.        Corrective lenses.  Respiratory: Negative for shortness of breath. Cough: dry.   Cardiovascular: Negative for chest pain, palpitations and leg swelling.  Gastrointestinal: Negative for abdominal pain.       Takes longer to eat, swallow.   Genitourinary: Negative.   Musculoskeletal: Positive for myalgias. Negative for neck pain.  Neurological: Positive for weakness. Negative for dizziness, tremors, seizures and headaches.       Balance disturbance. Dementia.  Endo/Heme/Allergies: Negative for polydipsia.  Psychiatric/Behavioral: Negative for suicidal ideas and hallucinations.    Past Medical History  Diagnosis Date  . Cardiac pacemaker in situ     a.fib w/slow ventricular response  . Atrial fibrillation   . Myalgia and myositis, unspecified   . Unspecified essential hypertension   . Other and unspecified hyperlipidemia   . Sinoatrial node dysfunction   . Acute posthemorrhagic anemia   . Depression   . Gastric ulcer   . Dementia   . Vitamin D deficiency   . Gait disorder   . Chronic kidney disease, stage II (mild)   . Acute gastric ulcer with hemorrhage, without mention of obstruction   . Other malaise and fatigue   . Edema   . Personal history of fall     . Hemorrhage of gastrointestinal tract, unspecified 10/13/11  . Muscle weakness (generalized)   . Anemia, unspecified   . Other specified disease of sebaceous glands     xerosis cutis  . Unspecified pruritic disorder   . Unspecified urinary incontinence 01/01/10  . Unspecified hearing loss   . Peripheral vascular disease, unspecified   . Syncope and collapse 08/08/2008  . Abrasion of face   . Cough   . Type II or unspecified type diabetes mellitus with renal manifestations, not stated as uncontrolled   . Subdural hematoma 04/19/2014    04/19/14 CT head and cervical spine: MPRESSION: 1. There is new isodense subdural left hemispheric collection which measures 9 mm maximum thickness. Only small amount of subacute blood products anteriorly see axial image 20. No mass effect or midline shift. Stable atrophy and chronic white matter disease. No definite acute cortical infarction. No mass lesion is noted on this unenhanced scan 2. No cervical spine acute fracture or subluxation. Degenerative changes as described above.   . Fall 10/07/2011  . Ecchymosis 04/04/2014  . CKD (chronic kidney disease) stage 4, GFR 15-29 ml/min 04/20/2014  . Chronic atrial fibrillation   . Loss of weight 09/18/2014    Patient Active Problem List   Diagnosis Date Noted  . Dehydration 09/22/2014  . Loss of weight 09/18/2014  . Insomnia due to medical condition 06/27/2014  . Traumatic subdural hemorrhage without loss of consciousness 06/20/2014  .  Anemia due to chronic blood loss 06/13/2014  . GERD (gastroesophageal reflux disease) 06/09/2014  . UTI (urinary tract infection) 05/02/2014  . Depression 04/25/2014  . Chronic atrial fibrillation   . CKD (chronic kidney disease) stage 4, GFR 15-29 ml/min 04/20/2014  . Subdural hematoma 04/19/2014  . Hearing loss 04/04/2014  . Ecchymosis 04/04/2014  . Pruritus 04/19/2013  . Diabetic nephropathy associated with type 2 diabetes mellitus 11/16/2012  . Dysphagia,  pharyngoesophageal phase 08/24/2012  . Diabetes mellitus with neurological manifestation 05/25/2012  . Dementia   . History of gastric ulcer 10/07/2011  . Fall 10/07/2011  . Gait instability 10/07/2011  . Anemia 10/07/2011  . Cardiac pacemaker - [employer] Jude Zephyr 2010   . Hyperlipemia 12/25/2008  . Essential hypertension 12/25/2008  . SICK SINUS SYNDROME 12/25/2008    Allergies  Allergen Reactions  . Aricept [Donepezil Hcl] Other (See Comments)    Unknown   . Simvastatin Other (See Comments)    Unknown     Medications: Patient's Medications  New Prescriptions   No medications on file  Previous Medications   DEXTROMETHORPHAN-MENTHOL (DELSYM COUGH RELIEF MT)    Take 30 mg by mouth every 12 (twelve) hours as needed (30 mg/40ml for cough).   ENSURE (ENSURE)    Take 237 mLs by mouth. One can daily   FERROUS SULFATE 325 (65 FE) MG TABLET    Take 325 mg by mouth daily with breakfast.   LORAZEPAM (ATIVAN) 0.5 MG TABLET    Take 0.5 mg by mouth every 8 (eight) hours.   PANTOPRAZOLE (PROTONIX) 40 MG TABLET    TAKE 1 TABLET DAILY TO REDUCE STOMACH ACID.   PYRITHIONE ZINC (SELSUN BLUE DRY SCALP) 1 % SHAMPOO    Shampoo at least 3 times weekly   SALINE NASAL SPRAY NA    Place into the nose. One spray in each nostril as needed   SERTRALINE (ZOLOFT) 50 MG TABLET    TAKE ONE TABLET DAILY TO HELP WITH NERVES.  Modified Medications   No medications on file  Discontinued Medications   No medications on file    Physical Exam: Filed Vitals:   09/22/14 1251  BP: 109/65  Pulse: 87  Temp: 97.3 F (36.3 C)  TempSrc: Tympanic  Resp: 20   There is no weight on file to calculate BMI.  Physical Exam  Constitutional:  Frail elderly male  HENT:  Bilateral hearing aids.   Eyes: Conjunctivae are normal. Pupils are equal, round, and reactive to light.  Neck: Normal range of motion. Neck supple. No JVD present. No tracheal deviation present. No thyromegaly present.  Deviated spine of the nose.    Cardiovascular: Normal rate and intact distal pulses.  Exam reveals no gallop and no friction rub.   No murmur heard. Atrial fib Diminished DP and PT bilaterally  Pulmonary/Chest: Effort normal. No respiratory distress. He has no wheezes. He has no rales.  Abdominal: Soft. Bowel sounds are normal. He exhibits no distension and no mass. There is no tenderness.  Musculoskeletal: Normal range of motion. He exhibits no edema or tenderness.  W/c for mobility  Lymphadenopathy:    He has no cervical adenopathy.  Neurological: He is alert. No cranial nerve deficit. Coordination abnormal.  11/22/13 MMSE 17/30. Failed clock drawing.  Skin: Skin is warm and dry. No rash noted. No erythema. No pallor.  Psychiatric: He has a normal mood and affect.  Cheerful. Forgetful.    Labs reviewed: Basic Metabolic Panel:  Recent Labs  40/98/11 0917 04/20/14  4540 04/21/14 0541 04/27/14 06/12/14 09/21/14  NA 138 136 138 141 142 147  K 4.5 4.3 4.0 4.1 4.2 4.3  CL 106 107 109  --   --   --   CO2 23 20 21   --   --   --   GLUCOSE 133* 130* 118*  --   --   --   BUN 29* 28* 29* 25* 42* 63*  CREATININE 2.38* 2.28* 2.32* 2.1* 2.2* 2.4*  CALCIUM 8.8 8.8 8.7  --   --   --     Liver Function Tests:  Recent Labs  04/19/14 0917 06/12/14 09/21/14  AST 17 15 24   ALT 13 13 23   ALKPHOS 106 76 81  BILITOT 1.4*  --   --   PROT 6.9  --   --   ALBUMIN 3.8  --   --     CBC:  Recent Labs  04/19/14 0917 04/20/14 0515  06/13/14 06/14/14 09/21/14  WBC 6.7 6.7  < > 5.1 8.0 8.5  NEUTROABS 5.3  --   --   --   --   --   HGB 11.6* 10.9*  < > 8.2* 9.1* 11.2*  HCT 35.6* 33.1*  < > 25* 27* 33*  MCV 94.7 94.0  --   --   --   --   PLT 140* 131*  < > 140* 155 148*  < > = values in this interval not displayed.  Lab Results  Component Value Date   TSH 0.92 09/21/2014   Lab Results  Component Value Date   HGBA1C 6.3* 03/27/2014   Lab Results  Component Value Date   CHOL 131 03/27/2014   HDL 39 03/27/2014    LDLCALC 73 03/27/2014   TRIG 97 03/27/2014    Significant Diagnostic Results since last visit: none  Patient Care Team: Kimber Relic, MD as PCP - General (Internal Medicine) [facility] Thurmon Fair, MD as Consulting Physician (Cardiology) Man Mast X, NP as Nurse Practitioner (Nurse Practitioner)  Assessment/Plan Problem List Items Addressed This Visit    Essential hypertension (Chronic)    Controlled. Off med.       Anemia (Chronic)    09/21/14 Hgb 11.2 Continue Fe      Dementia (Chronic)    04/22/14 SLUMS 7/30 06/09/14 dc [facility] 08/28/14 MMSE 18/30  Continue SNF for care needs, declined physically and cognitively.       CKD (chronic kidney disease) stage 4, GFR 15-29 ml/min (Chronic)    04/27/14 Bun/creat 25/2.14 06/12/14 Bun/creat 42/2.21 09/21/14 Bun/creat 63/2.45 Limited oral fluid intake contributory, encourage oral fluid intakes, update BMP      Chronic atrial fibrillation    Heart rate is in control.       Dehydration - Primary    Limited oral intake due to patient's frequent refusal. 09/21/14 Na 147, Bun 63, creatinine 2.45. Will encourage oral fluid intake, q2h while a awake x 72 hours, repeat BMP. May IVF if POA desires.           Family/ staff Communication: oral fluid 200cc q2h for 72 hours.   Labs/tests ordered:  BMP  ManXie Mast NP Geriatrics [facility] [address] [place], [place] [address] On Call:  [phone] & follow prompts after 5pm & weekends Office Phone:  [phone] Office Fax:  [phone]

## 2014-09-22 NOTE — Assessment & Plan Note (Signed)
Controlled. Off med   

## 2014-09-22 NOTE — Assessment & Plan Note (Signed)
04/22/14 SLUMS 7/30 06/09/14 dc [facility] 08/28/14 MMSE 18/30  Continue SNF for care needs, declined physically and cognitively.

## 2014-09-22 NOTE — Assessment & Plan Note (Signed)
Limited oral intake due to patient's frequent refusal. 09/21/14 Na 147, Bun 63, creatinine 2.45. Will encourage oral fluid intake, q2h while a awake x 72 hours, repeat BMP. May IVF if POA desires.

## 2014-09-22 NOTE — Assessment & Plan Note (Signed)
09/21/14 Hgb 11.2 Continue Fe

## 2014-09-25 LAB — BASIC METABOLIC PANEL
BUN: 69 mg/dL — AB (ref 4–21)
Creatinine: 2.7 mg/dL — AB (ref 0.6–1.3)
GLUCOSE: 108 mg/dL
POTASSIUM: 4.4 mmol/L (ref 3.4–5.3)
Sodium: 154 mmol/L — AB (ref 137–147)

## 2014-09-26 ENCOUNTER — Encounter: Payer: Self-pay | Admitting: *Deleted

## 2014-09-26 ENCOUNTER — Non-Acute Institutional Stay (SKILLED_NURSING_FACILITY): Payer: Medicare Other | Admitting: Nurse Practitioner

## 2014-09-26 ENCOUNTER — Encounter: Payer: Self-pay | Admitting: Nurse Practitioner

## 2014-09-26 DIAGNOSIS — I482 Chronic atrial fibrillation, unspecified: Secondary | ICD-10-CM

## 2014-09-26 DIAGNOSIS — F0390 Unspecified dementia without behavioral disturbance: Secondary | ICD-10-CM

## 2014-09-26 DIAGNOSIS — I10 Essential (primary) hypertension: Secondary | ICD-10-CM

## 2014-09-26 DIAGNOSIS — R627 Adult failure to thrive: Secondary | ICD-10-CM | POA: Insufficient documentation

## 2014-09-26 NOTE — Assessment & Plan Note (Signed)
04/22/14 SLUMS 7/30 06/09/14 dc [facility] 08/28/14 MMSE 18/30  Continue SNF for care needs, end stage, Hospice referral.

## 2014-09-26 NOTE — Assessment & Plan Note (Signed)
May expect to drop due to limited oral intake, comfort measures only.

## 2014-09-26 NOTE — Progress Notes (Signed)
Patient ID: Patrick Murray, male   DOB: September 07, 1917, 81 y.o.   MRN: [id number]  Location:  SNF [facility] Provider:  Chipper Oman NP  Code Status:  DNR Goals of care: Advanced Directive information    Chief Complaint  Patient presents with  . Medical Management of Chronic Issues  . Acute Visit    FTT, end stage of dementia, Afib     HPI: Patient is a 81 y.o. male seen in the SNF at [facility] today for evaluation of  FTT, limited oral intake, 09/25/14 Na 154, Bun 69, creat 2.69, spoke with his son Chee Kinslow discussed plan of care, wished comfort measures and Hospice referral. Hx of end stage of dementia, total care of his ADLs. Afib with heart rate in 90s.   Review of Systems:  Review of Systems  Constitutional: Positive for weight loss and malaise/fatigue. Negative for fever, chills and diaphoresis.  HENT: Positive for hearing loss. Negative for congestion, ear pain and nosebleeds.   Eyes: Negative.        Corrective lenses.  Respiratory: Negative for shortness of breath. Cough: dry.   Cardiovascular: Negative for chest pain, palpitations and leg swelling.  Gastrointestinal: Negative for abdominal pain.       Takes longer to eat, swallow.   Genitourinary: Negative.   Musculoskeletal: Positive for myalgias. Negative for neck pain.  Neurological: Positive for weakness. Negative for dizziness, tremors, seizures and headaches.       Balance disturbance. Dementia.  Psychiatric/Behavioral: Positive for memory loss. Negative for suicidal ideas and hallucinations.    Past Medical History  Diagnosis Date  . Cardiac pacemaker in situ     a.fib w/slow ventricular response  . Atrial fibrillation   . Myalgia and myositis, unspecified   . Unspecified essential hypertension   . Other and unspecified hyperlipidemia   . Sinoatrial node dysfunction   . Acute posthemorrhagic anemia   . Depression   . Gastric ulcer   . Dementia   . Vitamin D deficiency   . Gait disorder   .  Chronic kidney disease, stage II (mild)   . Acute gastric ulcer with hemorrhage, without mention of obstruction   . Other malaise and fatigue   . Edema   . Personal history of fall   . Hemorrhage of gastrointestinal tract, unspecified 10/13/11  . Muscle weakness (generalized)   . Anemia, unspecified   . Other specified disease of sebaceous glands     xerosis cutis  . Unspecified pruritic disorder   . Unspecified urinary incontinence 01/01/10  . Unspecified hearing loss   . Peripheral vascular disease, unspecified   . Syncope and collapse 08/08/2008  . Abrasion of face   . Cough   . Type II or unspecified type diabetes mellitus with renal manifestations, not stated as uncontrolled   . Subdural hematoma 04/19/2014    04/19/14 CT head and cervical spine: MPRESSION: 1. There is new isodense subdural left hemispheric collection which measures 9 mm maximum thickness. Only small amount of subacute blood products anteriorly see axial image 20. No mass effect or midline shift. Stable atrophy and chronic white matter disease. No definite acute cortical infarction. No mass lesion is noted on this unenhanced scan 2. No cervical spine acute fracture or subluxation. Degenerative changes as described above.   . Fall 10/07/2011  . Ecchymosis 04/04/2014  . CKD (chronic kidney disease) stage 4, GFR 15-29 ml/min 04/20/2014  . Chronic atrial fibrillation   . Loss of weight 09/18/2014  Patient Active Problem List   Diagnosis Date Noted  . FTT (failure to thrive) in adult 09/26/2014  . Dehydration 09/22/2014  . Loss of weight 09/18/2014  . Insomnia due to medical condition 06/27/2014  . Traumatic subdural hemorrhage without loss of consciousness 06/20/2014  . Anemia due to chronic blood loss 06/13/2014  . GERD (gastroesophageal reflux disease) 06/09/2014  . UTI (urinary tract infection) 05/02/2014  . Depression 04/25/2014  . Chronic atrial fibrillation   . CKD (chronic kidney disease) stage 4, GFR 15-29  ml/min 04/20/2014  . Subdural hematoma 04/19/2014  . Hearing loss 04/04/2014  . Ecchymosis 04/04/2014  . Pruritus 04/19/2013  . Diabetic nephropathy associated with type 2 diabetes mellitus 11/16/2012  . Dysphagia, pharyngoesophageal phase 08/24/2012  . Diabetes mellitus with neurological manifestation 05/25/2012  . Dementia   . History of gastric ulcer 10/07/2011  . Fall 10/07/2011  . Gait instability 10/07/2011  . Anemia 10/07/2011  . Cardiac pacemaker - [employer] Jude Zephyr 2010   . Hyperlipemia 12/25/2008  . Essential hypertension 12/25/2008  . SICK SINUS SYNDROME 12/25/2008    Allergies  Allergen Reactions  . Aricept [Donepezil Hcl] Other (See Comments)    Unknown   . Simvastatin Other (See Comments)    Unknown     Medications: Patient's Medications  New Prescriptions   No medications on file  Previous Medications   DEXTROMETHORPHAN-MENTHOL (DELSYM COUGH RELIEF MT)    Take 30 mg by mouth every 12 (twelve) hours as needed (30 mg/43ml for cough).   ENSURE (ENSURE)    Take 237 mLs by mouth. One can daily   FERROUS SULFATE 325 (65 FE) MG TABLET    Take 325 mg by mouth daily with breakfast.   LORAZEPAM (ATIVAN) 0.5 MG TABLET    Take 0.5 mg by mouth every 8 (eight) hours.   PANTOPRAZOLE (PROTONIX) 40 MG TABLET    TAKE 1 TABLET DAILY TO REDUCE STOMACH ACID.   PYRITHIONE ZINC (SELSUN BLUE DRY SCALP) 1 % SHAMPOO    Shampoo at least 3 times weekly   SALINE NASAL SPRAY NA    Place into the nose. One spray in each nostril as needed   SERTRALINE (ZOLOFT) 50 MG TABLET    TAKE ONE TABLET DAILY TO HELP WITH NERVES.  Modified Medications   No medications on file  Discontinued Medications   No medications on file    Physical Exam: Filed Vitals:   09/26/14 1148  BP: 146/70  Pulse: 96  Temp: 96.2 F (35.7 C)  TempSrc: Tympanic  Resp: 20   There is no weight on file to calculate BMI.  Physical Exam  Constitutional:  Frail elderly male  HENT:  Bilateral hearing aids.   Eyes:  Conjunctivae are normal. Pupils are equal, round, and reactive to light.  Neck: Normal range of motion. Neck supple. No JVD present. No tracheal deviation present. No thyromegaly present.  Deviated spine of the nose.  Cardiovascular: Normal rate and intact distal pulses.  Exam reveals no gallop and no friction rub.   No murmur heard. Atrial fib Diminished DP and PT bilaterally  Pulmonary/Chest: Effort normal. No respiratory distress. He has no wheezes. He has no rales.  Abdominal: Soft. Bowel sounds are normal. He exhibits no distension and no mass. There is no tenderness.  Musculoskeletal: Normal range of motion. He exhibits no edema or tenderness.  W/c for mobility  Lymphadenopathy:    He has no cervical adenopathy.  Neurological: He is alert. No cranial nerve deficit. Coordination abnormal.  11/22/13 MMSE 17/30. Failed clock drawing.  Skin: Skin is warm and dry. No rash noted. No erythema. No pallor.  Psychiatric: He has a normal mood and affect.  Cheerful. Forgetful.    Labs reviewed: Basic Metabolic Panel:  Recent Labs  45/40/98 0917 04/20/14 0515 04/21/14 0541  06/12/14 09/21/14 09/25/14  NA 138 136 138  < > 142 147 154*  K 4.5 4.3 4.0  < > 4.2 4.3 4.4  CL 106 107 109  --   --   --   --   CO2 --   --   --   --   GLUCOSE 133* 130* 118*  --   --   --   --   BUN 29* 28* 29*  < > 42* 63* 69*  CREATININE 2.38* 2.28* 2.32*  < > 2.2* 2.4* 2.7*  CALCIUM 8.8 8.8 8.7  --   --   --   --   < > = values in this interval not displayed.  Liver Function Tests:  Recent Labs  04/19/14 0917 06/12/14 09/21/14  AST ALT ALKPHOS 106 76 81  BILITOT 1.4*  --   --   PROT 6.9  --   --   ALBUMIN 3.8  --   --     CBC:  Recent Labs  04/19/14 0917 04/20/14 0515  06/13/14 06/14/14 09/21/14  WBC 6.7 6.7  < > 5.1 8.0 8.5  NEUTROABS 5.3  --   --   --   --   --   HGB 11.6* 10.9*  < > 8.2* 9.1* 11.2*  HCT 35.6* 33.1*  < > 25* 27* 33*  MCV 94.7 94.0  --   --    --   --   PLT 140* 131*  < > 140* 155 148*  < > = values in this interval not displayed.  Lab Results  Component Value Date   TSH 0.92 09/21/2014   Lab Results  Component Value Date   HGBA1C 6.3* 03/27/2014   Lab Results  Component Value Date   CHOL 131 03/27/2014   HDL 39 03/27/2014   LDLCALC 73 03/27/2014   TRIG 97 03/27/2014    Significant Diagnostic Results since last visit: Na 154 09/25/14  Patient Care Team: Kimber Relic, MD as PCP - General (Internal Medicine) [facility] Thurmon Fair, MD as Consulting Physician (Cardiology) Man Mast X, NP as Nurse Practitioner (Nurse Practitioner)  Assessment/Plan Problem List Items Addressed This Visit    Essential hypertension (Chronic)    May expect to drop due to limited oral intake, comfort measures only.       Dementia (Chronic)    04/22/14 SLUMS 7/30 06/09/14 dc [facility] 08/28/14 MMSE 18/30  Continue SNF for care needs, end stage, Hospice referral.       Chronic atrial fibrillation    Heart rate is in control.       FTT (failure to thrive) in adult - Primary    Limited oral intake, 09/25/14 Na 154, Bun 69, creatinine 2.69, POA son Duston Smolenski desires comfort measures and Hospice referral.           Family/ staff Communication: Hospice referral.   Labs/tests ordered:  none  Chipper Oman NP Geriatrics [facility] [address] [place], [place] [address] On Call:  [phone] & follow prompts after 5pm & weekends Office Phone:  [phone] Office Fax:  [phone]   

## 2014-09-26 NOTE — Assessment & Plan Note (Signed)
Heart rate is in control.  

## 2014-09-26 NOTE — Assessment & Plan Note (Signed)
Limited oral intake, 09/25/14 Na 154, Bun 69, creatinine 2.69, POA son Patrick Murray desires comfort measures and Hospice referral.

## 2014-10-07 DEATH — deceased

## 2015-10-02 IMAGING — CR DG PELVIS 1-2V
2 series · 2 of 2 positions shown · non-contrast
Comparison: None

CLINICAL DATA: Unwitnessed fall, history dementia, bruising to face
and neck

EXAM:
PELVIS - 1-2 VIEW

[t pelvis ap (1 of 2)]
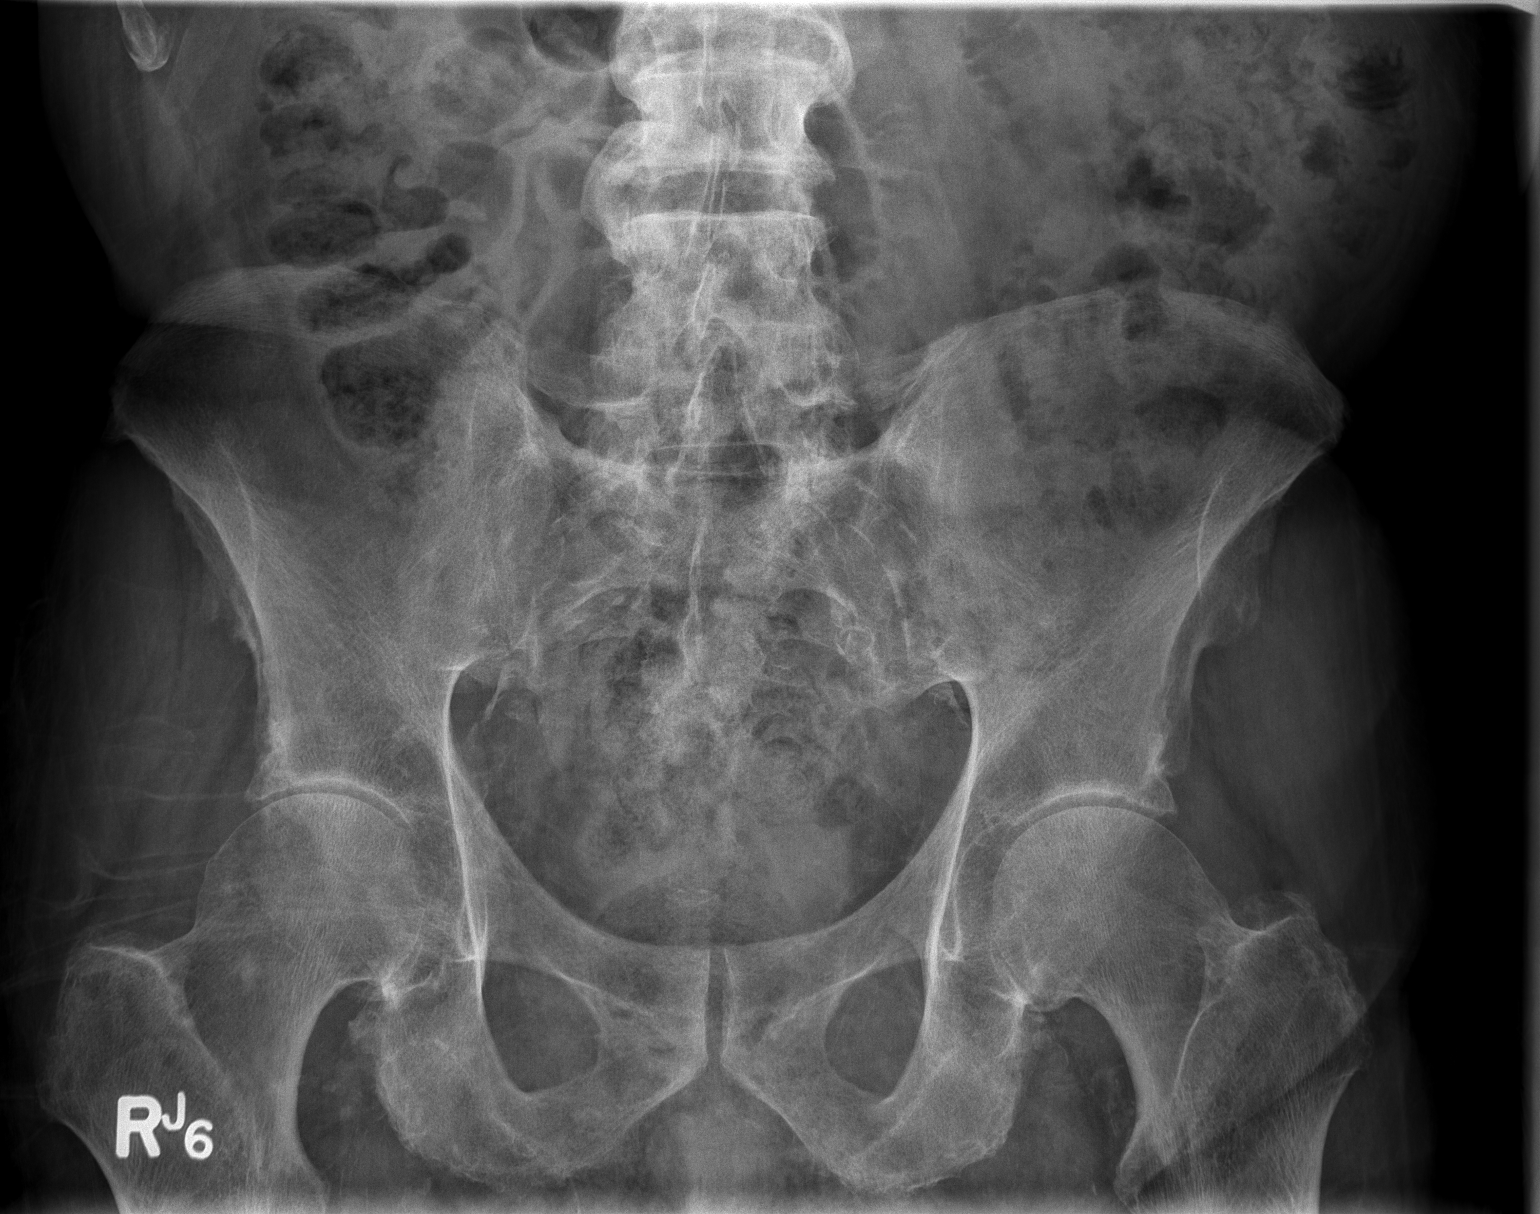

[t pelvis ap (2 of 2)]
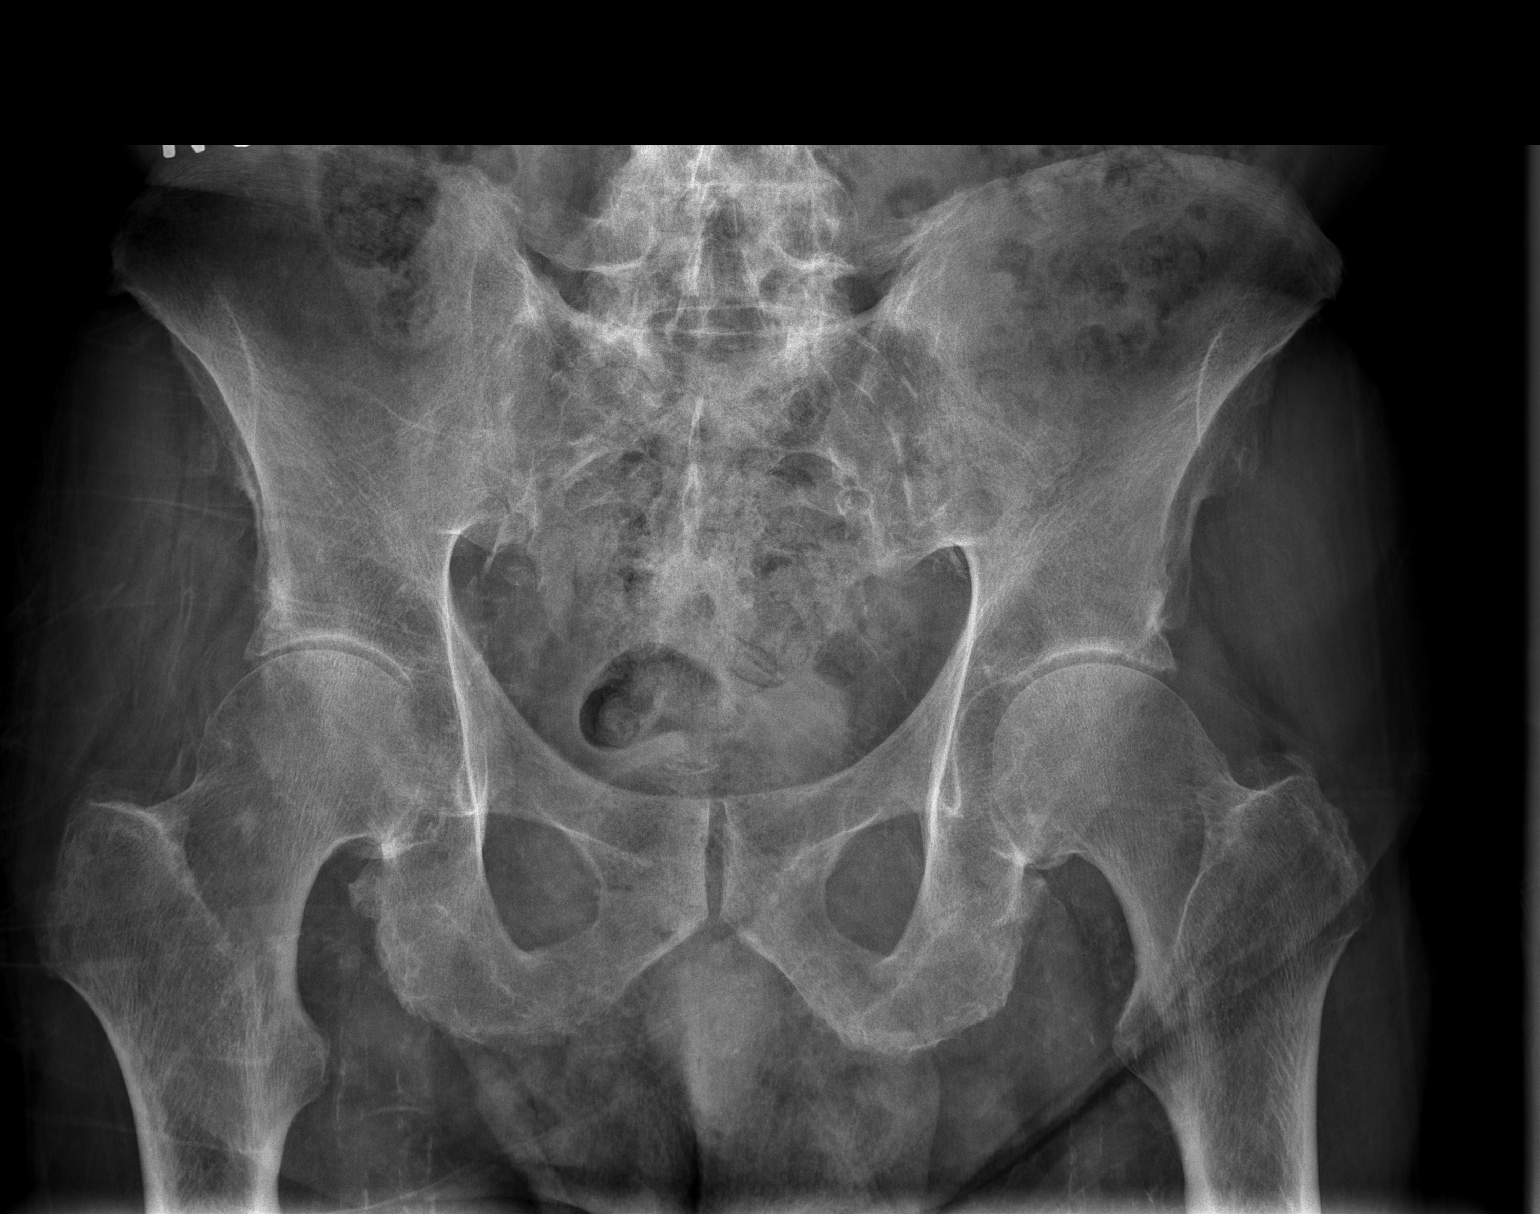

[2 of 2 positions shown; findings below may reference images not displayed]

FINDINGS: Osseous demineralization.

Mild narrowing of the hip joints bilaterally.

SI joints poorly visualized due to degree of demineralization.

No acute fracture, dislocation or bone destruction.

Degenerative disc disease changes at visualized lower lumbar spine.

Scattered atherosclerotic calcifications.
IMPRESSION: Osseous demineralization.

No acute abnormalities.

## 2015-10-02 IMAGING — CT CT CERVICAL SPINE W/O CM
4 of 6 series · 14 of 33 positions shown, 16 images · non-contrast
Comparison: 09/01/2012

CLINICAL DATA: Fall, dementia

EXAM:
CT HEAD WITHOUT CONTRAST
CT CERVICAL SPINE WITHOUT CONTRAST
TECHNIQUE: Multidetector CT imaging of the head and cervical spine was
performed following the standard protocol without intravenous
contrast. Multiplanar CT image reconstructions of the cervical spine
were also generated.

[Series 5: c-spine st · axial · 0.27mm/px · z∈[-241,-155]mm · 3 of 87 slices shown, 4 images]
[im 22/87  soft-tissue]
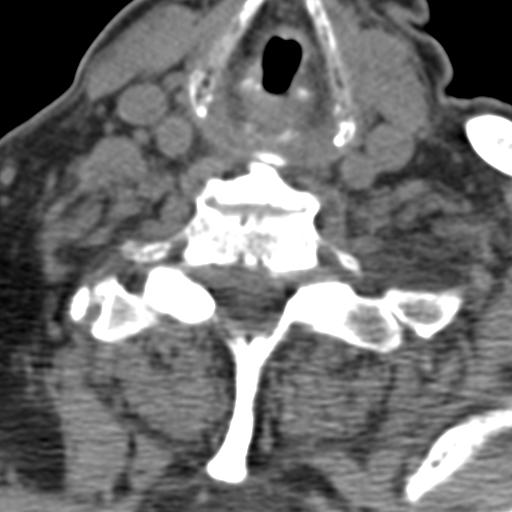
[im 22/87  bone]
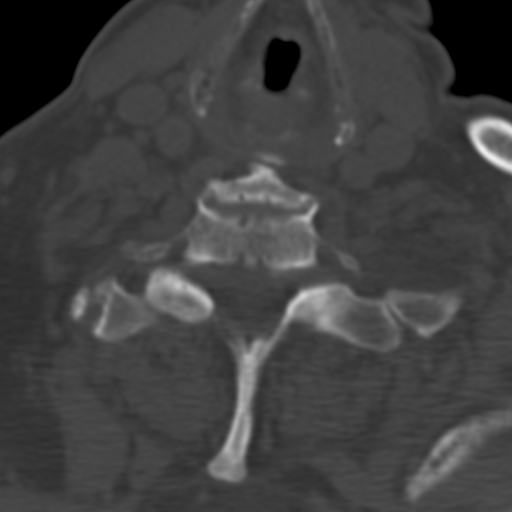
[im 44/87  bone]
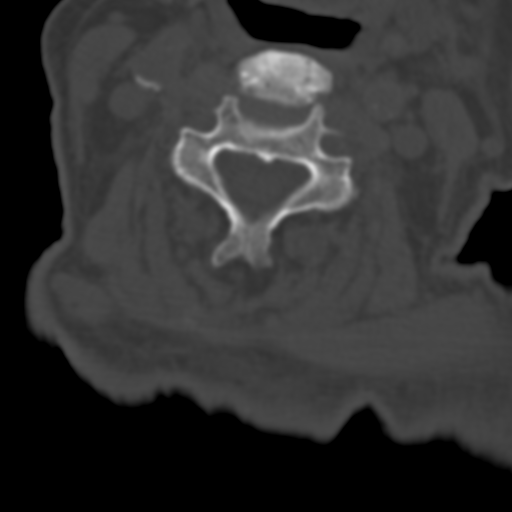
[im 65/87  bone]
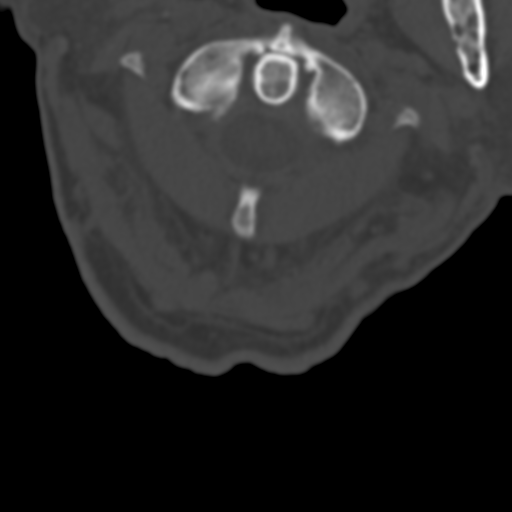

[Series 602: <mpr thick range> · coronal · 0.34mm/px · 3 of 49 slices shown]
[im 11/49  bone]
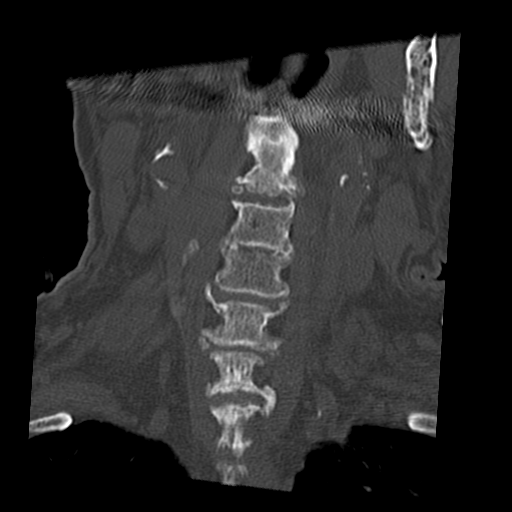
[im 20/49  bone]
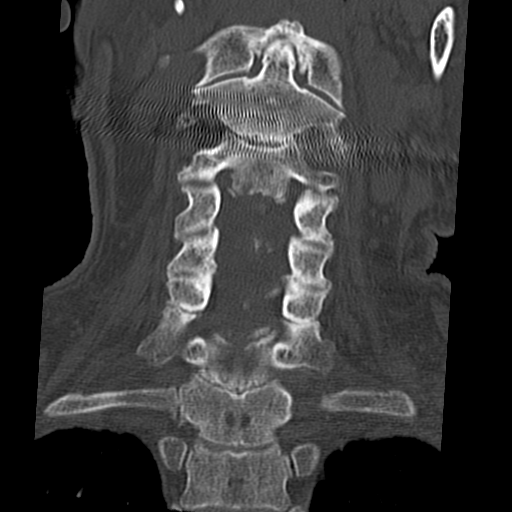
[im 29/49  bone]
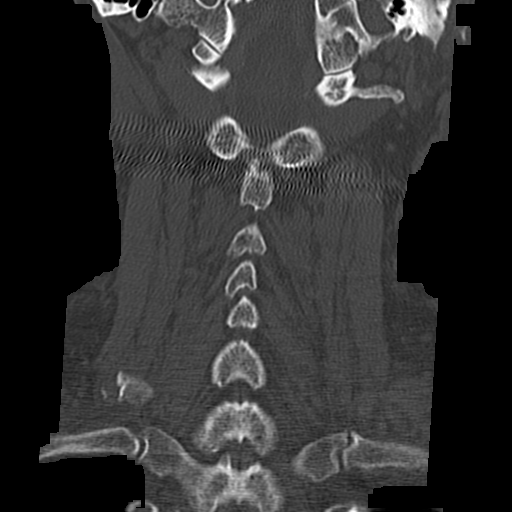

[Series 603: <mpr thick range(1)> · axial · 0.34mm/px · z∈[-261,-181]mm · 3 of 86 slices shown]
[im 22/86  bone]
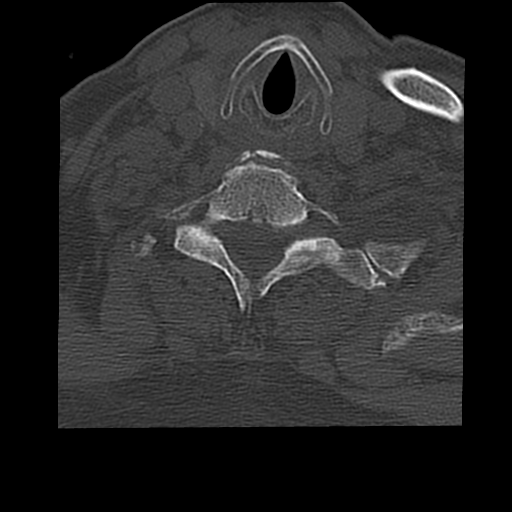
[im 43/86  bone]
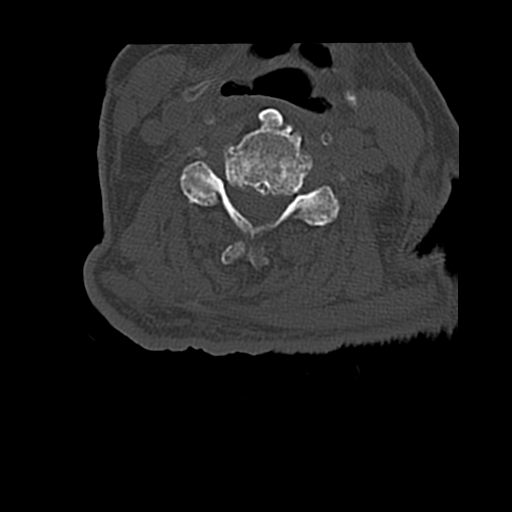
[im 64/86  bone]
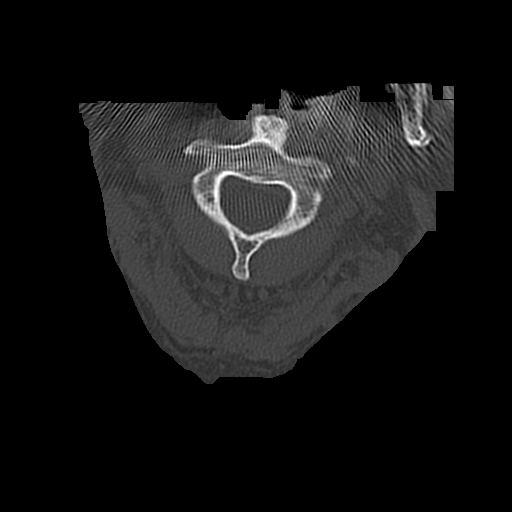

[Series 604: <mpr thick range(2)> · sagittal · 0.34mm/px · 5 of 49 slices shown, 6 images]
[im 17/49  bone]
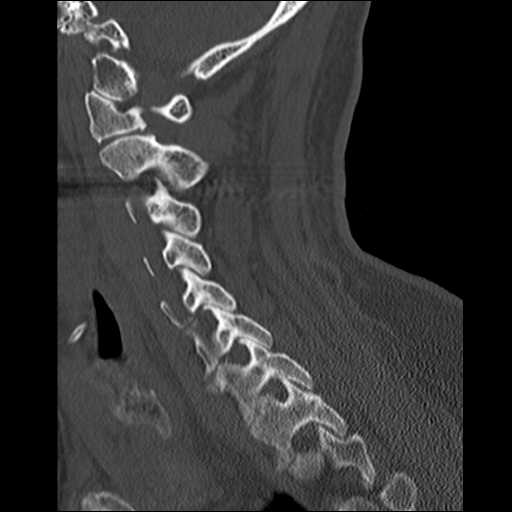
[im 21/49  bone]
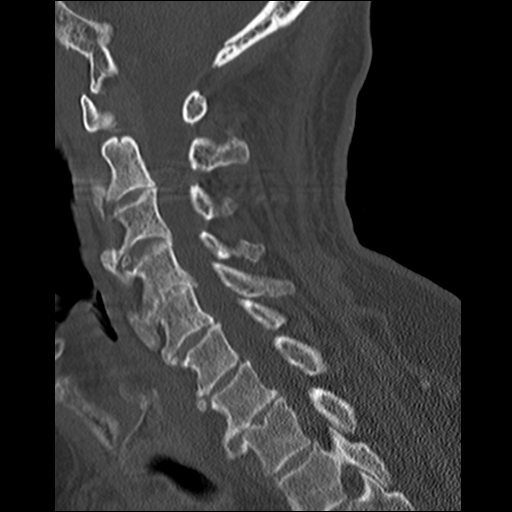
[im 25/49  soft-tissue]
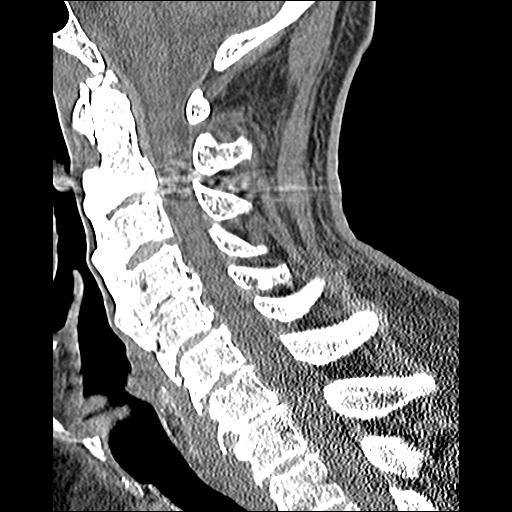
[im 25/49  bone]
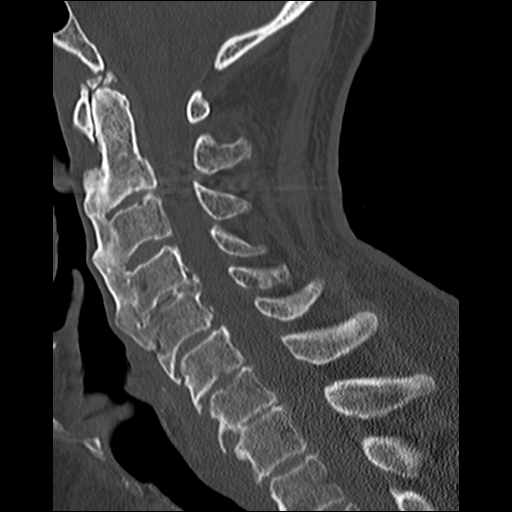
[im 29/49  bone]
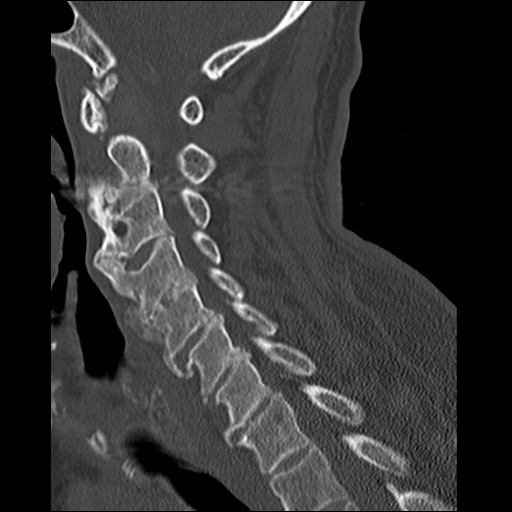
[im 33/49  bone]
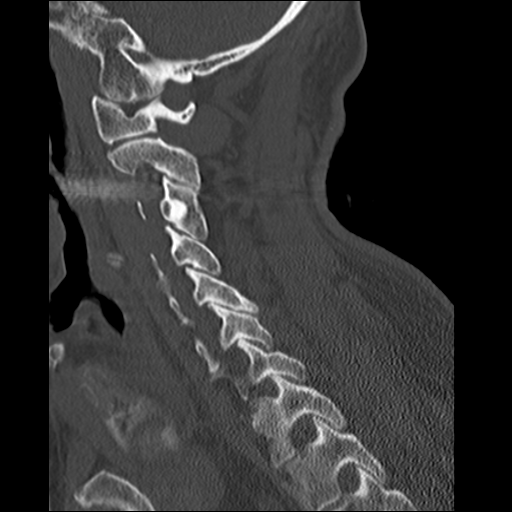

[14 of 33 positions shown; findings below may reference images not displayed]

FINDINGS: CT HEAD FINDINGS

No skull fracture is noted. Again noted central heterogeneous
opacification left maxillary sinus. Thickening of the lateral wall
of left maxillary sinus is stable. Stable cystic changes in left
sphenoid bone.

There is new subdural collection along left hemisphere which is
isodense measures about 9 mm thickness. Only small amount of
anterior increased attenuation axial image 20 probable subacute
blood products. No significant mass effect or midline shift. Stable
cerebral atrophy. Stable periventricular and patchy subcortical
chronic white matter disease. No definite acute cortical infarction.
No mass lesion is noted.

CT CERVICAL SPINE FINDINGS

Axial images of the cervical spine shows no acute fracture or or
subluxation. Atherosclerotic calcification bilateral carotid
bifurcation. Computer processed images shows no acute fracture or
subluxation. There is significant disc space flattening at C4-C5
level. Mild disc space flattening at C2-C3 and C5-C6 level. Mild
disc space flattening at C6-C7 and C7-T1 level. Large anterior
bridging osteophytes at C2-C3 C3-C4 and C4-C5 level. Degenerative
changes C1-C2 articulation. Multilevel facet degenerative changes.
There is no pneumothorax in visualized lung apices.
IMPRESSION: 1. There is new isodense subdural left hemispheric collection which
measures 9 mm maximum thickness. Only small amount of subacute blood
products anteriorly see axial image 20. No mass effect or midline
shift. Stable atrophy and chronic white matter disease. No definite
acute cortical infarction. No mass lesion is noted on this
unenhanced scan
2. No cervical spine acute fracture or subluxation. Degenerative
changes as described above. These results were called by telephone
at the time of interpretation on 04/19/2014 at [DATE] to Dr. MINIMI
MJELDE , who verbally acknowledged these results.
